# Patient Record
Sex: Female | Born: 1945 | Race: White | Hispanic: No | Smoking: Former smoker
Health system: Southern US, Community
[De-identification: ages and names within clinical notes are randomized; demographics above are authoritative.]

## PROBLEM LIST (undated history)

## (undated) DIAGNOSIS — I10 Essential (primary) hypertension: Secondary | ICD-10-CM

## (undated) DIAGNOSIS — K219 Gastro-esophageal reflux disease without esophagitis: Secondary | ICD-10-CM

## (undated) DIAGNOSIS — R911 Solitary pulmonary nodule: Secondary | ICD-10-CM

## (undated) DIAGNOSIS — K2940 Chronic atrophic gastritis without bleeding: Secondary | ICD-10-CM

## (undated) DIAGNOSIS — M1990 Unspecified osteoarthritis, unspecified site: Secondary | ICD-10-CM

## (undated) DIAGNOSIS — K5730 Diverticulosis of large intestine without perforation or abscess without bleeding: Secondary | ICD-10-CM

## (undated) DIAGNOSIS — E538 Deficiency of other specified B group vitamins: Secondary | ICD-10-CM

## (undated) DIAGNOSIS — I4891 Unspecified atrial fibrillation: Secondary | ICD-10-CM

## (undated) DIAGNOSIS — J449 Chronic obstructive pulmonary disease, unspecified: Secondary | ICD-10-CM

## (undated) DIAGNOSIS — R918 Other nonspecific abnormal finding of lung field: Secondary | ICD-10-CM

## (undated) DIAGNOSIS — E876 Hypokalemia: Secondary | ICD-10-CM

## (undated) HISTORY — DX: Unspecified osteoarthritis, unspecified site: M19.90

## (undated) HISTORY — PX: CERVICAL SPINE SURGERY: SHX589

## (undated) HISTORY — DX: Unspecified atrial fibrillation: I48.91

## (undated) HISTORY — PX: CARPAL TUNNEL RELEASE: SHX101

## (undated) HISTORY — DX: Chronic atrophic gastritis without bleeding: K29.40

## (undated) HISTORY — DX: Essential (primary) hypertension: I10

## (undated) HISTORY — DX: Gastro-esophageal reflux disease without esophagitis: K21.9

## (undated) HISTORY — PX: TUBAL LIGATION: SHX77

## (undated) HISTORY — DX: Chronic obstructive pulmonary disease, unspecified: J44.9

## (undated) HISTORY — DX: Hypokalemia: E87.6

## (undated) HISTORY — DX: Deficiency of other specified B group vitamins: E53.8

## (undated) HISTORY — DX: Diverticulosis of large intestine without perforation or abscess without bleeding: K57.30

---

## 1998-01-07 ENCOUNTER — Other Ambulatory Visit: Admission: RE | Admit: 1998-01-07 | Discharge: 1998-01-07 | Payer: Self-pay | Admitting: Family Medicine

## 1998-11-23 ENCOUNTER — Encounter: Admission: RE | Admit: 1998-11-23 | Discharge: 1998-12-07 | Payer: Self-pay | Admitting: Family Medicine

## 1999-02-06 ENCOUNTER — Other Ambulatory Visit: Admission: RE | Admit: 1999-02-06 | Discharge: 1999-02-06 | Payer: Self-pay | Admitting: Family Medicine

## 2000-02-14 ENCOUNTER — Other Ambulatory Visit: Admission: RE | Admit: 2000-02-14 | Discharge: 2000-02-14 | Payer: Self-pay | Admitting: Family Medicine

## 2000-05-24 ENCOUNTER — Encounter: Payer: Self-pay | Admitting: Orthopedic Surgery

## 2000-05-24 ENCOUNTER — Encounter: Admission: RE | Admit: 2000-05-24 | Discharge: 2000-05-24 | Payer: Self-pay | Admitting: Orthopedic Surgery

## 2000-06-25 ENCOUNTER — Encounter: Payer: Self-pay | Admitting: Neurosurgery

## 2000-06-28 ENCOUNTER — Encounter: Payer: Self-pay | Admitting: Neurosurgery

## 2000-06-28 ENCOUNTER — Ambulatory Visit (HOSPITAL_COMMUNITY): Admission: RE | Admit: 2000-06-28 | Discharge: 2000-06-29 | Payer: Self-pay | Admitting: Neurosurgery

## 2000-08-20 ENCOUNTER — Ambulatory Visit (HOSPITAL_COMMUNITY): Admission: RE | Admit: 2000-08-20 | Discharge: 2000-08-20 | Payer: Self-pay | Admitting: Neurosurgery

## 2000-08-20 ENCOUNTER — Encounter: Payer: Self-pay | Admitting: Neurosurgery

## 2001-03-13 ENCOUNTER — Other Ambulatory Visit: Admission: RE | Admit: 2001-03-13 | Discharge: 2001-03-13 | Payer: Self-pay | Admitting: Family Medicine

## 2001-04-16 ENCOUNTER — Encounter: Admission: RE | Admit: 2001-04-16 | Discharge: 2001-04-16 | Payer: Self-pay | Admitting: Family Medicine

## 2001-04-16 ENCOUNTER — Encounter: Payer: Self-pay | Admitting: Family Medicine

## 2001-07-28 DIAGNOSIS — K2940 Chronic atrophic gastritis without bleeding: Secondary | ICD-10-CM | POA: Insufficient documentation

## 2001-07-28 DIAGNOSIS — K449 Diaphragmatic hernia without obstruction or gangrene: Secondary | ICD-10-CM | POA: Insufficient documentation

## 2001-07-28 DIAGNOSIS — K648 Other hemorrhoids: Secondary | ICD-10-CM | POA: Insufficient documentation

## 2001-07-29 ENCOUNTER — Ambulatory Visit (HOSPITAL_COMMUNITY): Admission: RE | Admit: 2001-07-29 | Discharge: 2001-07-29 | Payer: Self-pay | Admitting: Gastroenterology

## 2001-07-29 ENCOUNTER — Encounter: Payer: Self-pay | Admitting: Gastroenterology

## 2002-04-08 ENCOUNTER — Other Ambulatory Visit: Admission: RE | Admit: 2002-04-08 | Discharge: 2002-04-08 | Payer: Self-pay | Admitting: Family Medicine

## 2003-04-09 ENCOUNTER — Other Ambulatory Visit: Admission: RE | Admit: 2003-04-09 | Discharge: 2003-04-09 | Payer: Self-pay | Admitting: Family Medicine

## 2004-06-16 ENCOUNTER — Ambulatory Visit (HOSPITAL_COMMUNITY): Admission: RE | Admit: 2004-06-16 | Discharge: 2004-06-16 | Payer: Self-pay | Admitting: Family Medicine

## 2004-08-17 ENCOUNTER — Other Ambulatory Visit: Admission: RE | Admit: 2004-08-17 | Discharge: 2004-08-17 | Payer: Self-pay | Admitting: Family Medicine

## 2005-12-13 ENCOUNTER — Other Ambulatory Visit: Admission: RE | Admit: 2005-12-13 | Discharge: 2005-12-13 | Payer: Self-pay | Admitting: Family Medicine

## 2006-08-23 ENCOUNTER — Ambulatory Visit: Payer: Self-pay | Admitting: Gastroenterology

## 2006-08-23 LAB — CONVERTED CEMR LAB
Albumin: 3.8 g/dL (ref 3.5–5.2)
Amylase: 82 units/L (ref 27–131)
Basophils Relative: 0.4 % (ref 0.0–1.0)
Bilirubin, Direct: 0.1 mg/dL (ref 0.0–0.3)
CO2: 31 meq/L (ref 19–32)
Calcium: 9.8 mg/dL (ref 8.4–10.5)
Chloride: 103 meq/L (ref 96–112)
GFR calc non Af Amer: 68 mL/min
Glucose, Bld: 90 mg/dL (ref 70–99)
HCT: 44.2 % (ref 36.0–46.0)
Hemoglobin: 15.1 g/dL — ABNORMAL HIGH (ref 12.0–15.0)
Monocytes Absolute: 0.6 10*3/uL (ref 0.2–0.7)
Neutrophils Relative %: 57.4 % (ref 43.0–77.0)
RDW: 12.8 % (ref 11.5–14.6)
Sed Rate: 11 mm/hr (ref 0–25)
Tissue Transglutaminase Ab, IgA: <1 units (ref <7)
Total Bilirubin: 0.5 mg/dL (ref 0.3–1.2)
Vitamin B-12: 473 pg/mL (ref 211–911)
WBC: 7.1 10*3/uL (ref 4.5–10.5)

## 2006-08-28 ENCOUNTER — Ambulatory Visit: Payer: Self-pay | Admitting: Cardiovascular Disease

## 2006-08-30 ENCOUNTER — Ambulatory Visit: Payer: Self-pay | Admitting: Gastroenterology

## 2006-09-11 ENCOUNTER — Encounter: Payer: Self-pay | Admitting: Gastroenterology

## 2006-09-11 ENCOUNTER — Ambulatory Visit: Payer: Self-pay | Admitting: Gastroenterology

## 2006-09-11 DIAGNOSIS — K5730 Diverticulosis of large intestine without perforation or abscess without bleeding: Secondary | ICD-10-CM | POA: Insufficient documentation

## 2006-10-04 ENCOUNTER — Ambulatory Visit (HOSPITAL_BASED_OUTPATIENT_CLINIC_OR_DEPARTMENT_OTHER): Admission: RE | Admit: 2006-10-04 | Discharge: 2006-10-04 | Payer: Self-pay | Admitting: Urology

## 2006-10-08 ENCOUNTER — Ambulatory Visit: Payer: Self-pay | Admitting: Gastroenterology

## 2006-11-29 ENCOUNTER — Encounter: Admission: RE | Admit: 2006-11-29 | Discharge: 2006-11-29 | Payer: Self-pay | Admitting: Orthopedic Surgery

## 2007-07-02 DIAGNOSIS — D510 Vitamin B12 deficiency anemia due to intrinsic factor deficiency: Secondary | ICD-10-CM

## 2008-01-01 ENCOUNTER — Ambulatory Visit (HOSPITAL_COMMUNITY): Admission: RE | Admit: 2008-01-01 | Discharge: 2008-01-01 | Payer: Self-pay | Admitting: Ophthalmology

## 2008-03-19 HISTORY — PX: OTHER SURGICAL HISTORY: SHX169

## 2010-08-01 NOTE — Assessment & Plan Note (Signed)
[facility]                         GASTROENTEROLOGY OFFICE NOTE   [name], [name]                       MRN:          [id number]  DATE:08/30/2006                            DOB:          1945/10/02    OFFICE NOTE   Shawna [name] had an abnormal CT scan and is referred to Dr. Darvin Neighbours for a  urologic evaluation.  She continues with a constant dull aching  sensation to the right lower quadrant going into her right flank with  associated abdominal gas, bloating, and alternating diarrhea and  constipation.  All lab data was unremarkable, as was abdominal exam.   VITAL SIGNS:  All stable.  However, she continues to have a rather  progressive weight loss of concern.   RECOMMENDATIONS:  1. Urologic referral.  2. Outpatient endoscopy and colonoscopy with colon and small bowel      biopsies.  3. Trial of Xifaxan 400 mg t.i.d. for 10 days along with daily Align.  4. Continue other medications listed and reviewed in her chart.     Vania Rea. Jarold Motto, MD, Caleen Essex, FAGA  Electronically Signed    DRP/MedQ  DD: 08/30/2006  DT: 08/30/2006  Job #: [id number]

## 2010-08-01 NOTE — Op Note (Signed)
[name], [name]              ACCOUNT NO.:  [id number]   MEDICAL RECORD NO.:  [id number]          PATIENT TYPE:  AMB   LOCATION:  [facility]                         FACILITY:  [facility]   PHYSICIAN:  Ronald L. [name], M.D.  DATE OF BIRTH:  08-12-1945   DATE OF PROCEDURE:  10/04/2006  DATE OF DISCHARGE:                               OPERATIVE REPORT   PREOPERATIVE DIAGNOSIS:  Left ureteral filling defect.   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   PROCEDURE PERFORMED:  Cystoscopy, bilateral retrograde pyelography.   SURGEON:  Gaynelle Arabian, MD.   ASSISTANT:  Tarri Glenn, MD.   ANESTHESIA:  General.   SPECIMENS:  None.   INDICATION FOR PROCEDURE:  [name] is a 65 year old female with a  history of abdominal pain.  She underwent a shield cut CT scan.  Axial  imaging with coronal reconstructions indicated a dilated distal left  ureter with concern for intraureteral filling defect.  Because of this,  the patient was brought to the operating room for the above-mentioned  procedures.   DESCRIPTION OF PROCEDURE IN DETAIL:  The patient was brought to the  operating room.  She was identified by her arm band.  Informed consent  was verified, and a preoperative timeout was performed.  After the  successful induction of general anesthesia, the patient was moved to the  dorsal lithotomy position.  All appropriate pressure points were padded  to avoid neurapraxic compartment syndrome.  Sequential compression  devices were employed.  Preoperative antibiotics were administered.  The  perineum was prepped and draped.  The surgeon was gowned and gloved.  A  #22-French cystoscopic sheath was used to introduce the 12-degree  cystoscopic lens transurethrally into the bladder.  Pancystourethroscopy  was performed with 12 and 70-degree lenses.  Bilateral ureteral orifices  were noted to be in normal anatomic position along the trigone.  Both  were  seen to efflux clear urine.  The remainder of the bladder was  inspected and was free of any mucosal lesions, erythema, foreign bodies,  diverticula, stones.  Attention was turned to the right ureteral  orifice.  It was cannulated with a cone-tipped catheter.  Contrast was  injected under fluoroscopy, and a right retrograde pyelogram was  performed.   Right retrograde pyelogram revealed a ureter that was normal in course  and caliber.  Contrast opacified all the calices of the right collecting  system, and they were free of any filling defects or hydronephrosis.   Attention was turned to the left ureteral orifice.  It was similarly  injected with contrast under fluoroscopy, and a left retrograde  pyelogram was performed.   Left retrograde pyelogram revealed a duplication with a common sheath of  the left collecting system.  The presumptive upper pole moiety was  aborted into a dilated blind ending ureter that terminated at the level  of the pelvic brim.  The lower pole moiety filled nicely and had a  somewhat drooping-lily appearance to it with only two major infundibula.  There were no filling defects.  The collecting system both of  the true  ureter and the aborted duplicate were smooth-walled and had no filling  defects.   We inserted a sensor wire and manipulated it to the level of the kidney.  We then used an angled Glidewire to cannulate the ureter a second time  and this time manipulated that wire into the aborted duplicate dilated  segment.  We then cannulated that segment with a #6-French end-hole  catheter and removed the Glidewire.  We then injected contrast to  confirm that indeed this was a smooth-walled area.  It did freely  communicate with the normal ureter as, under gentle pressure, contrast  was seen to reflux up into the kidney.  Having diagnosed the anatomic  variant and having seen no indication for a biopsy, we thus terminated  the procedure.  The patient  tolerated the procedure well, and there were  no complications.  Shawna [name] was the attending primary responsible  physician and was present and participated in all aspects of the  procedure.   DISPOSITION:  The patient was awoken from general anesthetic and  transported safely to the PACU.      Terie Purser, MD      Lucrezia Starch. [name], M.D.  Electronically Signed    JH/MEDQ  D:  10/04/2006  T:  10/04/2006  Job:  [id number]

## 2010-08-01 NOTE — Assessment & Plan Note (Signed)
[facility]                         GASTROENTEROLOGY OFFICE NOTE   Shawna [name], Shawna [name]                       MRN:          [id number]  DATE:08/23/2006                            DOB:          June 22, 1945    Shawna [name] is self-referred today for evaluation of a right-sided  abdominal pain that has been present for the last 3-4 weeks.   HISTORY OF PRESENT ILLNESS:  I have seen Shawna [name] for many years  because of Shawna [name] current abdominal pain which has defied diagnosis with  multiple procedures.  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  She is felt to have irritable bowel syndrome and has had  chronic, exertional right upper quadrant, right costal margin pain of  unexplained etiology.  I have not seen Shawna [name] in the last five years, and  she now presents with recurrence of Shawna [name] right upper quadrant/right flank  pain radiating into Shawna [name] back, which she says is made worse by having  bowel movements and is occasionally associated with episodes of nausea  and vomiting.  She has a lot of abdominal gas and bloating, anorexia.  Alledges she has lost 40 pounds of weight over the last several years.  She has alternating diarrhea and constipation, mostly constipation, but  denies melena or hematochezia.  She denies reflux symptoms and has no  dysphagia.  She has never had abdominal surgery, and previous exams of  Shawna [name] abdomen and gallbladder have been unremarkable.  Last CT scan was  five years ago.  Previous gastric biopsies have shown no evidence of  Helicobacter pylori infection.  She has had colonoscopy and small bowel  biopsies which have been normal.  Empiric biopsies of Shawna [name] colon have  also been unremarkable.  Because of an elevated gastrin level, she had a  Secretan stimulation test in 1992, which was not consistent with Z-E  syndrome but more consistent with atrophic gastritis.   The last endoscopic exam in our office was a colonoscopy in 2003  and an  endoscopic exam at that time.   The patient's current pain seems to be constant in nature now and has  really no precipitating or alleviating elements.  The striking component  to Shawna [name] pain is anorexia and weight loss.  She has no real steatorrhea-  type symptoms.  She denies fever, chills, skin rashes, joint pain, oral  stomatitis, or systemic complaints.  She has no peripheral vascular  disease, although she does smoke.   Shawna [name] past medical history is otherwise fairly noncontributory except for  hypertension.  She has had a previous tubal ligation, carpal tunnel  surgery, and cervical disk surgery.   MEDICATIONS:  1. Zesteretic daily.  2. Potassium 20 mEq a day.  3. B12 injections monthly.   FAMILY HISTORY:  Remarkable for diabetes and atherogenesis in Shawna [name]  mother.   SOCIAL HISTORY:  She is married and lives with Shawna [name] husband.  Has a  manual labor job.  She has an 11th grade education.  Smokes one pack of  cigarettes per day.  Denies ethanol abuse or dependency.   REVIEW OF SYSTEMS:  Otherwise noncontributory except for some chronic  low back pain.  She denies cardiovascular, pulmonary, genitourinary,  neurologic, orthopedic, or endocrine problems at this time.  She also  denies severe anxiety and depression.   PHYSICAL EXAMINATION:  GENERAL:  She is a healthy-appearing white female  in no distress.  VITAL SIGNS:  She is 5 feet 6 inches tall and weighs 121 pounds.  Blood  pressure 120/80, pulse 76 and regular.  SKIN:  I could not appreciate stigmata of chronic liver disease.  NECK:  There was no thyromegaly or lymphadenopathy noted.  CHEST:  Entirely clear.  HEART:  Appeared to be in a regular rate and rhythm without murmurs,  rubs or gallops.  ABDOMEN:  Soft, flat, nontender.  Without masses.  Bowel sounds were  normal.  EXTREMITIES:  Shawna [name] upper extremities were unremarkable.  RECTUM:  Inspection of the rectum was unremarkable, as was rectal exam.  Stool was guaiac  negative.  MENTAL STATUS:  Clear.   ASSESSMENT:  Shawna [name] has chronic abdominal complaints without any  diagnosis ever really being determined.  What is of concern is that she  has lost approximately 45 pounds of weight over the last 10 years.  She  does have a history of known atrophic gastritis and may have associated  bacterial overgrowth syndrome; however, because of the recurrent severe  nature of Shawna [name] pain, I have decided to go ahead and do imaging studies  before further evaluation.   RECOMMENDATIONS:  1. CT scan of the abdomen and pelvis.  2. Check blood work and screening stool specimens.  3. GI followup in one week's time and consider repeat endoscopic exam,      depending on CT results.  4. 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   ADDENDUM:  I have given Shawna [name] an excuse from work at Shawna [name] request until Shawna [name]  workup is complete.  I have given Shawna [name], in the interim, some Darvocet-N  100 to use for pain along with hyoscyamine 0.125 mg every 6-8 hours.     Vania Rea. Jarold Motto, MD, Caleen Essex, FAGA  Electronically Signed    DRP/MedQ  DD: 08/23/2006  DT: 08/24/2006  Job #: [id number]   cc:   Ernestina Penna, M.D.

## 2010-08-01 NOTE — Letter (Signed)
August 30, 2006    Windy Fast L. [name], M.D.  [address]  [place], [place] [address]   RE:  KARLETTA, MILLAY  MRN:  [id number]  /  DOB:  09/11/1945   Dear Ferne Reus:   Mrs. Liggins had a CT scan because of abdominal pain on August 28, 2006.  This was interpreted by Dr. Kennith Center showing a goblet sign in the  left ureter.  I am referring her at his request to exclude uro-  epithelial neoplasm.  She really denies genitourinary problems.    Sincerely,      Vania Rea. Jarold Motto, MD, Caleen Essex, FAGA  Electronically Signed    DRP/MedQ  DD: 08/30/2006  DT: 08/30/2006  Job #: [id number]

## 2010-08-04 NOTE — Op Note (Signed)
[facility]  Patient:    Shawna [name], Shawna [name]                       MRN: [id number] Proc. Date: 06/28/00 Adm. 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  PROCEDURE:  C5-6 anterior cervical diskectomy and fusion with allograft and anterior plate instrumentation.  SURGEON:  Julio Sicks, M.D.  ASSISTANT:  Donalee Citrin, Montez Hageman., M.D.  ANESTHESIA:  General endotracheal.  INDICATIONS:  [name] is a 65 year old female with history of severe neck and left upper extremity pain, paresthesias, and weakness consistent with a left-sided C6 radiculopathy, as well as evidence of early cervical myelopathy. MRI scanning demonstrates marked stenosis at the C5-6 level with bilateral neural foraminal narrowing and compression of the left-sided C6 nerve root by what appears to be an acute soft disk herniation within the foramen.  The patient has been counseled as to her options.  She has decided to proceed with a C5-6 anterior cervical diskectomy and fusion with allograft, anterior plating, for hopeful relief of her symptoms.  DESCRIPTION OF PROCEDURE:  Patient taken to the operating room, placed on the operating table in the supine position.  After an adequate level of anesthesia was achieved, the patient was positioned supine with her neck slightly extended and held in place with Holter traction.  The patients anterior cervical region is shaved and prepped sterilely.  A 10 blade is used to make a linear incision overlying the C5-6 interspace.  This was carried down sharply to the platysma.  The platysma was then divided vertically, and dissection proceeded along the medial border of the sternocleidomastoid muscle and carotid sheath on the right.  The  trachea and esophagus were mobilized and retracted toward the left.  The prevertebral fascia was stripped off the anterior spinal column.  The longus colli muscle was then elevated bilaterally using electrocautery.  Deep self-retaining retractor was placed. Intraoperative fluoroscopy was used, and the C5-6 level was confirmed.  Disk space was then incised with a 15 blade in rectangular fashion.  A wide disk space cleanout was then achieved using pituitary rongeurs, forward and backward-angled Karlin curettes, Kerrison rongeurs, and the high-speed drill. All disk was removed down to the level of the posterior annulus.  Microscope was brought into the field and used for the remainder of the diskectomy.  The remaining aspects of annulus and osteophytes were removed using the high-speed drill down to the level of the posterior longitudinal ligament.  The posterior longitudinal ligament was then elevated and resected in piecemeal fashion using Kerrison rongeurs.  The underlying thecal sac was identified.  A wide central decompression was then achieved using the Kerrison rongeurs. Decompression then proceeded out into the C6 foramina bilaterally.  The C6 nerve roots were identified bilaterally.  They were followed out distally along the course of the nerve root, and then the nerve roots themselves were widely decompressed.  Off to the left side there was a moderate amount of free disk herniation that was encountered and completely resected.  At this point, a probe passed easily both superiorly and inferiorly and down each foramen. The wound was then copiously irrigated, and Gelfoam was placed topically for hemostasis,  found to be good.  The disk space was then distracted and a 6 mm fibular wedge allograft was then packed into place and recessed approximately 1 mm from the anterior cortical surface.  The microscope and retractor system were removed.  Hemostasis was achieved with bipolar  electrocautery.  The wound was then irrigated with antibiotic solution one final time.  Final images using the intraoperative fluoroscopy revealed good position of bone graft and hardware, proper operative level, with normal alignment of the spine.  The wound was then closed in typical fashion.  Steri-Strips and a sterile dressing were applied.  There were no apparent complications.  The patient tolerated the procedure well and returns to the recovery room postoperatively.DD: 06/28/00 TD:  06/29/00 Job: [id number]

## 2010-12-19 LAB — HEMOGLOBIN AND HEMATOCRIT, BLOOD
HCT: 45
Hemoglobin: 15.5 — ABNORMAL HIGH

## 2010-12-19 LAB — BASIC METABOLIC PANEL
BUN: 13
CO2: 33 — ABNORMAL HIGH
Calcium: 9.9
GFR calc non Af Amer: 57 — ABNORMAL LOW
Glucose, Bld: 97
Potassium: 3.9

## 2011-01-01 LAB — URINALYSIS, ROUTINE W REFLEX MICROSCOPIC
Bilirubin Urine: NEGATIVE
Ketones, ur: NEGATIVE
Nitrite: NEGATIVE
Specific Gravity, Urine: 1.021
Urobilinogen, UA: 0.2

## 2011-01-01 LAB — COMPREHENSIVE METABOLIC PANEL
Alkaline Phosphatase: 80
BUN: 12
CO2: 28
GFR calc Af Amer: >60
GFR calc non Af Amer: >60
Glucose, Bld: 70
Sodium: 139
Total Bilirubin: 0.4
Total Protein: 6.4

## 2011-01-01 LAB — CBC
HCT: 39.4
Hemoglobin: 13.5
Platelets: 158
RDW: 14.5 — ABNORMAL HIGH
WBC: 5.8

## 2011-01-01 LAB — PROTIME-INR
INR: 1
Prothrombin Time: 13.7

## 2011-05-14 DIAGNOSIS — R911 Solitary pulmonary nodule: Secondary | ICD-10-CM

## 2011-05-14 DIAGNOSIS — M1990 Unspecified osteoarthritis, unspecified site: Secondary | ICD-10-CM

## 2011-05-14 DIAGNOSIS — I10 Essential (primary) hypertension: Secondary | ICD-10-CM

## 2011-05-14 DIAGNOSIS — J449 Chronic obstructive pulmonary disease, unspecified: Secondary | ICD-10-CM

## 2011-05-14 DIAGNOSIS — E876 Hypokalemia: Secondary | ICD-10-CM

## 2011-05-14 DIAGNOSIS — E538 Deficiency of other specified B group vitamins: Secondary | ICD-10-CM | POA: Insufficient documentation

## 2011-05-15 ENCOUNTER — Encounter: Payer: Self-pay | Admitting: Thoracic Surgery (Cardiothoracic Vascular Surgery)

## 2011-05-15 ENCOUNTER — Institutional Professional Consult (permissible substitution) (INDEPENDENT_AMBULATORY_CARE_PROVIDER_SITE_OTHER): Payer: Medicare Other | Admitting: Thoracic Surgery (Cardiothoracic Vascular Surgery)

## 2011-05-15 ENCOUNTER — Other Ambulatory Visit: Payer: Self-pay | Admitting: Thoracic Surgery (Cardiothoracic Vascular Surgery)

## 2011-05-15 VITALS — BP 160/97 | HR 92 | Resp 18 | Ht 66.0 in | Wt 135.0 lb

## 2011-05-15 DIAGNOSIS — D381 Neoplasm of uncertain behavior of trachea, bronchus and lung: Secondary | ICD-10-CM

## 2011-05-15 DIAGNOSIS — R911 Solitary pulmonary nodule: Secondary | ICD-10-CM

## 2011-05-15 NOTE — Progress Notes (Signed)
PCP is BUTLER,CYNTHIA, DO, DO Referring Provider is Samuel Jester, DO  Chief Complaint  Patient presents with  . Lung Lesion    Referral from Dr Charm Barges for eval on RUL nodule, Chest CT 05/10/2011    HPI: Shawna [name] is a 66 year old woman who presents with chief complaint of a lung nodule.  Shawna. [name] is a 66 year old woman with a history of tobacco abuse and COPD, she complains of a cough, chest tightness, and shortness of breath dating back about a month. She's had bronchitis before these symptoms were similar so she didn't immediately seek medical attention. She says it started with nasal and sinus congestion and then moved down to her chest. She does note that her chest tightness and shortness of breath with exertion have been worse recently particularly when she walks up stairs. She saw [name] who recommended a chest x-ray, she initially did not want to do the x-ray but a week later when her symptoms were persistent she went ahead and had the x-ray done and it showed a new right upper lobe shadow. A CT of the chest was done which showed a spiculated 1.4 x 1.2 x 1.0 cm right upper lobe mass. There is no hilar or mediastinal adenopathy. There was evidence of emphysema. There also was noted significant calcification in the aorta and coronary arteries.   Past Medical History  Diagnosis Date  . COPD (chronic obstructive pulmonary disease)   . HTN (hypertension)   . B12 deficiency   . OA (osteoarthritis)   . Hypokalemia     No past surgical history on file.  Family history Significant for her mother having cardiovascular disease   Social History History  Substance Use Topics  . Smoking status: Current Everyday Smoker -- 20.0 packs/day    Types: Cigarettes  . Smokeless tobacco: Not on file  . Alcohol Use: Not on file    Current Outpatient Prescriptions  Medication Sig Dispense Refill  . albuterol (PROVENTIL HFA;VENTOLIN HFA) 108 (90 BASE) MCG/ACT inhaler Inhale 2 puffs  into the lungs every 6 (six) hours as needed.      . Fluticasone-Salmeterol (ADVAIR) 500-50 MCG/DOSE AEPB Inhale 1 puff into the lungs every 12 (twelve) hours.      Marland Kitchen ipratropium-albuterol (DUONEB) 0.5-2.5 (3) MG/3ML SOLN Take 3 mLs by nebulization every 4 (four) hours as needed.      Marland Kitchen KLOR-CON M20 20 MEQ tablet Take 20 mEq by mouth daily.       Marland Kitchen lisinopril-hydrochlorothiazide (PRINZIDE,ZESTORETIC) 10-12.5 MG per tablet Take 1 tablet by mouth daily.      . meloxicam (MOBIC) 15 MG tablet Take 15 mg by mouth daily.        No Known Allergies  Review of Systems  Constitutional: Positive for activity change. Negative for fever, chills, appetite change and unexpected weight change.  HENT: Positive for congestion.   Eyes: Negative.   Respiratory: Positive for cough (nonproductive), chest tightness, shortness of breath (with exertion) and wheezing. Negative for stridor.   Cardiovascular: Positive for chest pain. Negative for leg swelling.  Genitourinary: Positive for vaginal discharge.  Musculoskeletal: Positive for back pain, joint swelling and arthralgias.  Neurological: Negative.   Hematological: Negative.   All other systems reviewed and are negative.    BP 160/97  Pulse 92  Resp 18  Ht 5\' 6"  (1.676 m)  Wt 135 lb (61.236 kg)  BMI 21.79 kg/m2  SpO2 98% Physical Exam  Constitutional: She is oriented to person, place, and time. She appears well-developed  and well-nourished. No distress.  HENT:  Head: Normocephalic and atraumatic.  Eyes: EOM are normal. Pupils are equal, round, and reactive to light.  Neck: Neck supple. No JVD present. No tracheal deviation present. No thyromegaly present.       Well healed scar  Cardiovascular: Normal rate, regular rhythm, normal heart sounds and intact distal pulses.  Exam reveals no gallop and no friction rub.   No murmur heard. Pulmonary/Chest: Effort normal and breath sounds normal. She has no wheezes. She has no rales.  Abdominal: Soft. There  is no tenderness.  Musculoskeletal: Normal range of motion. She exhibits no edema.  Lymphadenopathy:    She has no cervical adenopathy.  Neurological: She is alert and oriented to person, place, and time.       No focal motor deficits  Skin: Skin is warm and dry.  Psychiatric: She has a normal mood and affect.     Diagnostic Tests: Chest x-ray and CT of the chest were reviewed. Findings as previously noted  Impression: 66 year old smoker with a newly discovered right upper lobe nodule which is highly suspicious for bronchogenic carcinoma. This has to be considered a lung cancer to be proven otherwise. Unfortunately the lesion is very central in the right upper lobe and is not amenable to wedge resection. Therefore I think we should proceed with a PET/CT. At the PET CT is positive I would recommend proceeding with a right upper lobectomy and, if the lesion were positive, mediastinal lymph node dissection. If the PET CT was negative, then I would recommend ENB. We will order the PET CT to be done as soon as possible  In the meantime, there are a couple of other issues related to her fitness for surgery. 1. She needs pulmonary function testing, we will go ahead and schedule that 2. She needs preoperative cardiology evaluation-she is at high risk to have coronary disease given her smoking, family history, and hypertension. She has symptoms that are as likely, or more likely, to be angina as to be due to COPD and bronchitis. And on top of that she has evidence of some calcified plaque in her coronary arteries on CT scan.  Plan: 1. PET/CT 2. PFT 3. Cardiology consultation in [place] 4. I will see her back once the above evaluations have been done to discuss our next step

## 2011-05-22 ENCOUNTER — Encounter (HOSPITAL_COMMUNITY)
Admission: RE | Admit: 2011-05-22 | Discharge: 2011-05-22 | Disposition: A | Discharge status: Home / Self Care | Payer: Medicare Other | Source: Ambulatory Visit | Attending: Thoracic Surgery (Cardiothoracic Vascular Surgery) | Admitting: Thoracic Surgery (Cardiothoracic Vascular Surgery)

## 2011-05-22 ENCOUNTER — Encounter (HOSPITAL_COMMUNITY): Payer: Self-pay

## 2011-05-22 ENCOUNTER — Ambulatory Visit (HOSPITAL_COMMUNITY)
Admission: RE | Admit: 2011-05-22 | Discharge: 2011-05-22 | Disposition: A | Discharge status: Home / Self Care | Payer: Medicare Other | Source: Ambulatory Visit | Attending: Thoracic Surgery (Cardiothoracic Vascular Surgery) | Admitting: Thoracic Surgery (Cardiothoracic Vascular Surgery)

## 2011-05-22 DIAGNOSIS — R062 Wheezing: Secondary | ICD-10-CM | POA: Insufficient documentation

## 2011-05-22 DIAGNOSIS — J988 Other specified respiratory disorders: Secondary | ICD-10-CM | POA: Insufficient documentation

## 2011-05-22 DIAGNOSIS — R222 Localized swelling, mass and lump, trunk: Secondary | ICD-10-CM | POA: Insufficient documentation

## 2011-05-22 DIAGNOSIS — R911 Solitary pulmonary nodule: Secondary | ICD-10-CM | POA: Insufficient documentation

## 2011-05-22 DIAGNOSIS — R0989 Other specified symptoms and signs involving the circulatory and respiratory systems: Secondary | ICD-10-CM | POA: Insufficient documentation

## 2011-05-22 DIAGNOSIS — R059 Cough, unspecified: Secondary | ICD-10-CM | POA: Insufficient documentation

## 2011-05-22 DIAGNOSIS — R0609 Other forms of dyspnea: Secondary | ICD-10-CM | POA: Insufficient documentation

## 2011-05-22 DIAGNOSIS — Z79899 Other long term (current) drug therapy: Secondary | ICD-10-CM | POA: Insufficient documentation

## 2011-05-22 DIAGNOSIS — D381 Neoplasm of uncertain behavior of trachea, bronchus and lung: Secondary | ICD-10-CM

## 2011-05-22 DIAGNOSIS — R05 Cough: Secondary | ICD-10-CM | POA: Insufficient documentation

## 2011-05-22 HISTORY — DX: Other nonspecific abnormal finding of lung field: R91.8

## 2011-05-22 LAB — PULMONARY FUNCTION TEST

## 2011-05-22 MED ORDER — ALBUTEROL SULFATE (5 MG/ML) 0.5% IN NEBU
2.5000 mg | INHALATION_SOLUTION | Freq: Once | RESPIRATORY_TRACT | Status: AC
  Administered 2011-05-22: 2.5 mg via RESPIRATORY_TRACT

## 2011-05-22 MED ORDER — FLUDEOXYGLUCOSE F - 18 (FDG) INJECTION
17.4000 | Freq: Once | INTRAVENOUS | Status: AC | PRN
  Administered 2011-05-22: 17.4 via INTRAVENOUS

## 2011-05-23 ENCOUNTER — Encounter: Payer: Self-pay | Admitting: *Deleted

## 2011-05-23 ENCOUNTER — Encounter: Payer: Self-pay | Admitting: Cardiology

## 2011-05-23 ENCOUNTER — Telehealth: Payer: Self-pay | Admitting: *Deleted

## 2011-05-23 ENCOUNTER — Ambulatory Visit (INDEPENDENT_AMBULATORY_CARE_PROVIDER_SITE_OTHER): Payer: Medicare Other | Admitting: Cardiology

## 2011-05-23 VITALS — BP 135/88 | HR 90 | Ht 66.0 in | Wt 130.0 lb

## 2011-05-23 DIAGNOSIS — I10 Essential (primary) hypertension: Secondary | ICD-10-CM

## 2011-05-23 DIAGNOSIS — Z01810 Encounter for preprocedural cardiovascular examination: Secondary | ICD-10-CM | POA: Insufficient documentation

## 2011-05-23 DIAGNOSIS — F17200 Nicotine dependence, unspecified, uncomplicated: Secondary | ICD-10-CM

## 2011-05-23 DIAGNOSIS — Z720 Tobacco use: Secondary | ICD-10-CM | POA: Insufficient documentation

## 2011-05-23 DIAGNOSIS — R9430 Abnormal result of cardiovascular function study, unspecified: Secondary | ICD-10-CM

## 2011-05-23 LAB — GLUCOSE, CAPILLARY: Glucose-Capillary: 93 mg/dL (ref 70–99)

## 2011-05-23 NOTE — Assessment & Plan Note (Signed)
We discussed a specific strategy for tobacco cessation.  (Greater than three minutes discussing tobacco cessation.)  She will try nicotine patches.

## 2011-05-23 NOTE — Assessment & Plan Note (Signed)
The patient, according to ACC/AHA guidelines, needs preoperative risk stratification given her known coronary disease, limited functional status and dyspnea. She would not be locked treadmill so she will have a [employer].  Further testing will be based on these results.

## 2011-05-23 NOTE — Progress Notes (Signed)
HPI The patient presents as a new patient for evaluation of coronary artery calcification. She's found to have a right upper lung nodule which is spiculated and consistent with a primary lung cancer. She is most likely going to need to have this resected he is being evaluated for that. As part of this she had a CT of her chest which demonstrated emphysema, the right upper lobe nodule and extensive aortic and coronary calcification. She has had no prior cardiac history. She recalls a stress test many years ago. She is limited somewhat by dyspnea but feels better since she is recovered from her recent bronchitis. She does get dyspneic with some activities but denies any PND or orthopnea. Her most exerting activity currently as vacuuming. With that she denies any chest pressure, neck or arm discomfort. She has no palpitations, presyncope or syncope.  No Known Allergies  Current Outpatient Prescriptions  Medication Sig Dispense Refill  . albuterol (PROVENTIL HFA;VENTOLIN HFA) 108 (90 BASE) MCG/ACT inhaler Inhale 2 puffs into the lungs every 6 (six) hours as needed.      . ALPRAZolam (XANAX) 0.25 MG tablet Take 0.25 mg by mouth at bedtime as needed.      . cyanocobalamin (,VITAMIN B-12,) 1000 MCG/ML injection Inject 1,000 mcg into the muscle every 30 (thirty) days.      . Fluticasone-Salmeterol (ADVAIR) 500-50 MCG/DOSE AEPB Inhale 1 puff into the lungs every 12 (twelve) hours.      Marland Kitchen ipratropium-albuterol (DUONEB) 0.5-2.5 (3) MG/3ML SOLN Take 3 mLs by nebulization every 4 (four) hours as needed.      Marland Kitchen KLOR-CON M20 20 MEQ tablet Take 20 mEq by mouth daily.       Marland Kitchen lisinopril-hydrochlorothiazide (PRINZIDE,ZESTORETIC) 20-12.5 MG per tablet Take 1 tablet by mouth daily.      . meloxicam (MOBIC) 15 MG tablet Take 15 mg by mouth daily.      . ranitidine (ZANTAC) 150 MG tablet Take 150 mg by mouth daily.        Past Medical History  Diagnosis Date  . COPD (chronic obstructive pulmonary disease)   .  HTN (hypertension)   . B12 deficiency   . OA (osteoarthritis)   . Hypokalemia   . Mass of lung     Past Surgical History  Procedure Date  . Carpal tunnel release     bilateral  . Tubal ligation     x 2  . Cervical spine surgery     Family History  Problem Relation Age of Onset  . Coronary artery disease Mother 51  . Alzheimer's disease Mother     History   Social History  . Marital Status: Divorced    Spouse Name: N/A    Number of Children: 3  . Years of Education: N/A   Occupational History  . RETIRED    Social History Main Topics  . Smoking status: Current Everyday Smoker -- 0.3 packs/day for 40 years    Types: Cigarettes  . Smokeless tobacco: Never Used   Comment: Down to a few cigarettes per day.  . Alcohol Use: Not on file  . Drug Use: Not on file  . Sexually Active: Not on file   Other Topics Concern  . Not on file   Social History Narrative   Lives alone.    ROS:  Or bruising, anemia, asthma, wheezing, heartburn. Otherwise as stated in the history of present illness and negative for all other systems.  PHYSICAL EXAM BP 135/88  Pulse 90  Ht 5\' 6"  (1.676 m)  Wt 130 lb (58.968 kg)  BMI 20.98 kg/m2  SpO2 96% GENERAL:  Well appearing HEENT:  Pupils equal round and reactive, fundi not visualized, oral mucosa unremarkable NECK:  No jugular venous distention, waveform within normal limits, carotid upstroke brisk and symmetric, no bruits, no thyromegaly LYMPHATICS:  No cervical, inguinal adenopathy LUNGS:  Clear to auscultation bilaterally BACK:  No CVA tenderness CHEST:  Unremarkable HEART:  PMI not displaced or sustained,S1 and S2 within normal limits, no S3, no S4, no clicks, no rubs, no murmurs ABD:  Flat, positive bowel sounds normal in frequency in pitch, no bruits, no rebound, no guarding, no midline pulsatile mass, no hepatomegaly, no splenomegaly EXT:  2 plus pulses throughout, no edema, no cyanosis no clubbing SKIN:  No rashes no  nodules NEURO:  Cranial nerves II through XII grossly intact, motor grossly intact throughout PSYCH:  Cognitively intact, oriented to person place and time  EKG:  Sinus rhythm, rate 96, axis within normal limits, intervals within normal limits, no acute ST-T wave changes.  ASSESSMENT AND PLAN

## 2011-05-23 NOTE — Assessment & Plan Note (Signed)
The blood pressure is at target. No change in medications is indicated. We will continue with therapeutic lifestyle changes (TLC).  

## 2011-05-23 NOTE — Telephone Encounter (Signed)
lexiscan myoview Scheduled for 05-30-2011 @ [facility] [employer]

## 2011-05-23 NOTE — Patient Instructions (Signed)
Your follow up will be based on your test results. Your physician recommends that you continue on your current medications as directed. Please refer to the Current Medication list given to you today. Your physician has requested that you have a lexiscan myoview. For further information please visit [email or web address]. Please follow instruction sheet, as given.  If the results of your test are normal or stable, you will receive a letter. If they are abnormal, the nurse will contact you by phone.  

## 2011-05-25 NOTE — Telephone Encounter (Signed)
Auth # [id number] exp 07/09/11

## 2011-05-29 ENCOUNTER — Encounter: Payer: Medicare Other | Admitting: Thoracic Surgery (Cardiothoracic Vascular Surgery)

## 2011-05-30 DIAGNOSIS — R079 Chest pain, unspecified: Secondary | ICD-10-CM

## 2011-05-31 ENCOUNTER — Encounter: Payer: Self-pay | Admitting: *Deleted

## 2011-05-31 ENCOUNTER — Telehealth: Payer: Self-pay | Admitting: *Deleted

## 2011-05-31 NOTE — Telephone Encounter (Signed)
Message copied by Arlyss Gandy on Thu May 31, 2011  3:40 PM ------      Message from: Rollene Rotunda      Created: Thu May 31, 2011 12:53 PM       Study negative for any evidence of ischemia or infarct.  Low risk study.  Therefore, based on ACC/AHA guidelines, the patient would be at acceptable risk for the planned procedure without further cardiovascular testing. Call Ms. Ken with the results and send results to BUTLER,CYNTHIA, DO.  Please fax this result to the surgeon for clearance. Thanks.

## 2011-05-31 NOTE — Telephone Encounter (Signed)
Pt notified of results and verbalized understanding.   Surgeon is Dr. Charlett Lango w/[facility]. Note will be routed to his inbasket. Also report faxed to Dr. Charm Barges.

## 2011-06-07 ENCOUNTER — Encounter: Payer: Medicare Other | Admitting: Thoracic Surgery (Cardiothoracic Vascular Surgery)

## 2011-06-12 ENCOUNTER — Encounter: Payer: Self-pay | Admitting: Thoracic Surgery (Cardiothoracic Vascular Surgery)

## 2011-06-12 ENCOUNTER — Ambulatory Visit (INDEPENDENT_AMBULATORY_CARE_PROVIDER_SITE_OTHER): Payer: Medicare Other | Admitting: Thoracic Surgery (Cardiothoracic Vascular Surgery)

## 2011-06-12 ENCOUNTER — Encounter: Payer: Medicare Other | Admitting: Thoracic Surgery (Cardiothoracic Vascular Surgery)

## 2011-06-12 VITALS — BP 166/90 | HR 76 | Resp 16 | Ht 66.0 in | Wt 135.0 lb

## 2011-06-12 DIAGNOSIS — D491 Neoplasm of unspecified behavior of respiratory system: Secondary | ICD-10-CM

## 2011-06-12 NOTE — Progress Notes (Signed)
Patient ID: Shawna [name], female   DOB: December 24, 1945, 66 y.o.   MRN: [id number] Mrs. Strider is a 66 year old woman with a history of tobacco abuse who was seen in the office in February regarding a new right upper lobe nodule. This was a 1.4 x 1.2 x 1.0 cm spiculated nodule located centrally in the right upper lobe.  I recommended that Ms. Harkless we proceed with evaluation for possible surgical resection with a PET/CT, PFT and cardiology evaluation.  She was seen by [name] and had a pharmacologic nuclear scan which was a low risk study.  PET/CT showed the right upper lobe nodule to be hypermetabolic with an SUV of 2.9, there is no evidence of regional or distant metastases  Pulmonary function testing showed moderate COPD and FEV1 of 1.68 (64% of predicted) which improved to 1.86 with bronchodilators. DLCO is 50%  I discussed the indications of these results with Mrs. Kintz. 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 She does understand there is a possibility this is not a lung cancer, but that is unlikely and can only be definitively proven with excisional biopsy. Bronchoscopic or percutaneous biopsy could have false-negative results and could not definitively rule out this as a cancer. Therefore I recommended to her that we proceed with a right VATS and right upper lobectomy with lymph node dissection for definitive diagnosis and treatment. This lesion is very centrally located and is not amenable to wedge resection prior to lobectomy.  I discussed with her the general nature of the procedure, incision to be used, need for general anesthesia, expected outcomes. She understands that even with surgical resection if there is a risk of recurrence lung cancer and she would need to continue to be followed. She understands the risk of surgery include but are not limited to death, stroke, MI, DVT, PE, bleeding,  possible need for transfusions, infections, prolonged air leak, as well as other unforeseeable complications. She is very anxious about the possibility of having surgery but did agree to proceed.  We will plan to proceed with surgery on Monday, April 15.

## 2011-06-28 ENCOUNTER — Encounter (HOSPITAL_COMMUNITY): Payer: Self-pay | Admitting: Pharmacy Technician

## 2011-06-29 ENCOUNTER — Other Ambulatory Visit: Payer: Self-pay

## 2011-06-29 DIAGNOSIS — R918 Other nonspecific abnormal finding of lung field: Secondary | ICD-10-CM

## 2011-07-04 ENCOUNTER — Encounter (HOSPITAL_COMMUNITY)
Admission: RE | Admit: 2011-07-04 | Discharge: 2011-07-04 | Disposition: A | Discharge status: Home / Self Care | Payer: Medicare Other | Source: Ambulatory Visit | Attending: Thoracic Surgery (Cardiothoracic Vascular Surgery) | Admitting: Thoracic Surgery (Cardiothoracic Vascular Surgery)

## 2011-07-04 ENCOUNTER — Encounter (HOSPITAL_COMMUNITY): Payer: Self-pay

## 2011-07-04 VITALS — BP 155/84 | HR 79 | Temp 97.8°F | Resp 20 | Ht 66.0 in | Wt 130.3 lb

## 2011-07-04 DIAGNOSIS — R918 Other nonspecific abnormal finding of lung field: Secondary | ICD-10-CM

## 2011-07-04 LAB — URINALYSIS, ROUTINE W REFLEX MICROSCOPIC
Glucose, UA: NEGATIVE mg/dL
Ketones, ur: NEGATIVE mg/dL
Leukocytes, UA: NEGATIVE
Nitrite: NEGATIVE
Protein, ur: NEGATIVE mg/dL

## 2011-07-04 LAB — APTT: aPTT: 28 seconds (ref 24–37)

## 2011-07-04 LAB — COMPREHENSIVE METABOLIC PANEL
ALT: 14 U/L (ref 0–35)
AST: 18 U/L (ref 0–37)
Calcium: 9.9 mg/dL (ref 8.4–10.5)
Sodium: 138 mEq/L (ref 135–145)
Total Protein: 6.8 g/dL (ref 6.0–8.3)

## 2011-07-04 LAB — CBC
MCH: 30 pg (ref 26.0–34.0)
MCHC: 34 g/dL (ref 30.0–36.0)
Platelets: 150 10*3/uL (ref 150–400)
RBC: 4.7 MIL/uL (ref 3.87–5.11)

## 2011-07-04 LAB — BLOOD GAS, ARTERIAL
Drawn by: 344381
FIO2: 0.21 %
O2 Saturation: 93.6 %
Patient temperature: 98.6
pH, Arterial: 7.448 — ABNORMAL HIGH (ref 7.350–7.400)
pO2, Arterial: 65.5 mmHg — ABNORMAL LOW (ref 80.0–100.0)

## 2011-07-04 NOTE — Pre-Procedure Instructions (Addendum)
[address]  07/04/2011   Your procedure is scheduled on:  July 06, 2011  Report to [facility] at 5:30 AM.  Call this number if you have problems the morning of surgery: [phone]   Remember:   Do not eat food:After Midnight.  May have clear liquids: up to 4 Hours before arrival.  Clear liquids include soda, tea, black coffee, apple or grape juice, broth.  Take these medicines the morning of surgery with A SIP OF WATER: INHALER, ZANTAC   Do not wear jewelry, make-up or nail polish.  Do not wear lotions, powders, or perfumes. You may wear deodorant.  Do not shave 48 hours prior to surgery.  Do not bring valuables to the hospital.  Contacts, dentures or bridgework may not be worn into surgery.  Leave suitcase in the car. After surgery it may be brought to your room.  For patients admitted to the hospital, checkout time is 11:00 AM the day of discharge.   Patients discharged the day of surgery will not be allowed to drive home.  Name and phone number of your driver: NA  Special Instructions: CHG Shower Use Special Wash: 1/2 bottle night before surgery and 1/2 bottle morning of surgery.   Please read over the following fact sheets that you were given: Pain Booklet, Blood Transfusion Information, MRSA Information and Surgical Site Infection Prevention

## 2011-07-05 MED ORDER — CEFUROXIME SODIUM 1.5 G IJ SOLR
1.5000 g | INTRAMUSCULAR | Status: AC
  Administered 2011-07-06: 1.5 g via INTRAVENOUS
  Filled 2011-07-05: qty 1.5

## 2011-07-05 NOTE — Consult Note (Signed)
Anesthesia Chart Review:  Patient is a 66 year old female scheduled for a right VATS, RU lobectomy on 07/06/11.  History included smoking, COPD, OA, HTN, B12 deficiency.  She saw [name] for a preoperative Cardiology evaluation.  A stress test was done at [facility] on 05/30/11 (scanned under Results Review tab) which showed normal LV perfusion, no ischemia, EF 60%, and felt low risk.  Preoperative labs, EKG, CXR noted.  Plan to proceed.

## 2011-07-06 ENCOUNTER — Ambulatory Visit (HOSPITAL_COMMUNITY): Payer: Medicare Other | Admitting: Vascular Surgery

## 2011-07-06 ENCOUNTER — Encounter (HOSPITAL_COMMUNITY): Payer: Self-pay | Admitting: Vascular Surgery

## 2011-07-06 ENCOUNTER — Inpatient Hospital Stay (HOSPITAL_COMMUNITY)
Admission: RE | Admit: 2011-07-06 | Discharge: 2011-07-15 | DRG: 163 | Disposition: A | Discharge status: Home Health | Payer: Medicare Other | Source: Ambulatory Visit | Attending: Thoracic Surgery (Cardiothoracic Vascular Surgery) | Admitting: Thoracic Surgery (Cardiothoracic Vascular Surgery)

## 2011-07-06 ENCOUNTER — Inpatient Hospital Stay (HOSPITAL_COMMUNITY): Payer: Medicare Other

## 2011-07-06 ENCOUNTER — Encounter (HOSPITAL_COMMUNITY): Payer: Self-pay | Admitting: *Deleted

## 2011-07-06 ENCOUNTER — Encounter (HOSPITAL_COMMUNITY)
Admission: RE | Disposition: A | Discharge status: Home Health | Payer: Self-pay | Source: Ambulatory Visit | Attending: Thoracic Surgery (Cardiothoracic Vascular Surgery)

## 2011-07-06 DIAGNOSIS — E876 Hypokalemia: Secondary | ICD-10-CM | POA: Diagnosis not present

## 2011-07-06 DIAGNOSIS — Z79899 Other long term (current) drug therapy: Secondary | ICD-10-CM

## 2011-07-06 DIAGNOSIS — Z01818 Encounter for other preprocedural examination: Secondary | ICD-10-CM

## 2011-07-06 DIAGNOSIS — J95811 Postprocedural pneumothorax: Secondary | ICD-10-CM | POA: Diagnosis not present

## 2011-07-06 DIAGNOSIS — J9382 Other air leak: Secondary | ICD-10-CM | POA: Diagnosis not present

## 2011-07-06 DIAGNOSIS — E538 Deficiency of other specified B group vitamins: Secondary | ICD-10-CM | POA: Diagnosis present

## 2011-07-06 DIAGNOSIS — K219 Gastro-esophageal reflux disease without esophagitis: Secondary | ICD-10-CM | POA: Diagnosis present

## 2011-07-06 DIAGNOSIS — Z01812 Encounter for preprocedural laboratory examination: Secondary | ICD-10-CM

## 2011-07-06 DIAGNOSIS — M1990 Unspecified osteoarthritis, unspecified site: Secondary | ICD-10-CM | POA: Diagnosis present

## 2011-07-06 DIAGNOSIS — D381 Neoplasm of uncertain behavior of trachea, bronchus and lung: Secondary | ICD-10-CM

## 2011-07-06 DIAGNOSIS — J438 Other emphysema: Secondary | ICD-10-CM | POA: Diagnosis present

## 2011-07-06 DIAGNOSIS — J690 Pneumonitis due to inhalation of food and vomit: Secondary | ICD-10-CM | POA: Diagnosis not present

## 2011-07-06 DIAGNOSIS — Y849 Medical procedure, unspecified as the cause of abnormal reaction of the patient, or of later complication, without mention of misadventure at the time of the procedure: Secondary | ICD-10-CM | POA: Diagnosis not present

## 2011-07-06 DIAGNOSIS — I700 Atherosclerosis of aorta: Secondary | ICD-10-CM | POA: Diagnosis present

## 2011-07-06 DIAGNOSIS — I2510 Atherosclerotic heart disease of native coronary artery without angina pectoris: Secondary | ICD-10-CM | POA: Diagnosis present

## 2011-07-06 DIAGNOSIS — F17200 Nicotine dependence, unspecified, uncomplicated: Secondary | ICD-10-CM | POA: Diagnosis present

## 2011-07-06 DIAGNOSIS — R918 Other nonspecific abnormal finding of lung field: Secondary | ICD-10-CM

## 2011-07-06 DIAGNOSIS — J8410 Pulmonary fibrosis, unspecified: Principal | ICD-10-CM | POA: Diagnosis present

## 2011-07-06 DIAGNOSIS — T8140XA Infection following a procedure, unspecified, initial encounter: Secondary | ICD-10-CM | POA: Diagnosis not present

## 2011-07-06 DIAGNOSIS — D72829 Elevated white blood cell count, unspecified: Secondary | ICD-10-CM | POA: Diagnosis not present

## 2011-07-06 DIAGNOSIS — Z01810 Encounter for preprocedural cardiovascular examination: Secondary | ICD-10-CM

## 2011-07-06 DIAGNOSIS — I4891 Unspecified atrial fibrillation: Secondary | ICD-10-CM | POA: Diagnosis not present

## 2011-07-06 DIAGNOSIS — I10 Essential (primary) hypertension: Secondary | ICD-10-CM | POA: Diagnosis present

## 2011-07-06 HISTORY — PX: LUNG SURGERY: SHX703

## 2011-07-06 SURGERY — VIDEO ASSISTED THORACOSCOPY (VATS)/ LOBECTOMY
Anesthesia: General | Site: Chest | Laterality: Right | Wound class: Clean Contaminated

## 2011-07-06 MED ORDER — ALBUTEROL SULFATE HFA 108 (90 BASE) MCG/ACT IN AERS
2.0000 | INHALATION_SPRAY | Freq: Four times a day (QID) | RESPIRATORY_TRACT | Status: DC | PRN
  Administered 2011-07-11: 2 via RESPIRATORY_TRACT
  Filled 2011-07-06 (×2): qty 6.7

## 2011-07-06 MED ORDER — TRAMADOL HCL 50 MG PO TABS
50.0000 mg | ORAL_TABLET | Freq: Four times a day (QID) | ORAL | Status: DC | PRN
  Administered 2011-07-07 – 2011-07-13 (×7): 100 mg via ORAL
  Administered 2011-07-14: 50 mg via ORAL
  Administered 2011-07-14 – 2011-07-15 (×2): 100 mg via ORAL
  Filled 2011-07-06 (×4): qty 2
  Filled 2011-07-06: qty 1
  Filled 2011-07-06 (×5): qty 2

## 2011-07-06 MED ORDER — 0.9 % SODIUM CHLORIDE (POUR BTL) OPTIME
TOPICAL | Status: DC | PRN
  Administered 2011-07-06: 1000 mL

## 2011-07-06 MED ORDER — FENTANYL 10 MCG/ML IV SOLN
INTRAVENOUS | Status: DC
  Administered 2011-07-06: 15 ug via INTRAVENOUS
  Administered 2011-07-06: 14:00:00 via INTRAVENOUS
  Administered 2011-07-07: 75 ug via INTRAVENOUS
  Administered 2011-07-07: 60 ug via INTRAVENOUS
  Administered 2011-07-07: 90 ug via INTRAVENOUS
  Administered 2011-07-07: 105 ug via INTRAVENOUS
  Administered 2011-07-07: 45 ug via INTRAVENOUS
  Administered 2011-07-07: 60 ug via INTRAVENOUS
  Administered 2011-07-07: 13:00:00 via INTRAVENOUS
  Administered 2011-07-08: 60 ug via INTRAVENOUS
  Administered 2011-07-08: 20:00:00 via INTRAVENOUS
  Administered 2011-07-08: 45 ug via INTRAVENOUS
  Administered 2011-07-08: 180 ug via INTRAVENOUS
  Administered 2011-07-08: 90 ug via INTRAVENOUS
  Administered 2011-07-09: 30 ug via INTRAVENOUS
  Administered 2011-07-09: 75 ug via INTRAVENOUS
  Administered 2011-07-09: 120 ug via INTRAVENOUS
  Administered 2011-07-09: 60 ug via INTRAVENOUS
  Administered 2011-07-09: 15 ug via INTRAVENOUS
  Administered 2011-07-09: 60 ug via INTRAVENOUS
  Administered 2011-07-10: 118.4 ug via INTRAVENOUS
  Administered 2011-07-10: 45 ug via INTRAVENOUS
  Administered 2011-07-10: 30 ug via INTRAVENOUS
  Administered 2011-07-10: 15 ug via INTRAVENOUS
  Administered 2011-07-10: 45 ug via INTRAVENOUS
  Administered 2011-07-11: 60 ug via INTRAVENOUS
  Administered 2011-07-11: 20 ug via INTRAVENOUS
  Administered 2011-07-11: 120 ug via INTRAVENOUS
  Administered 2011-07-11: 22:00:00 via INTRAVENOUS
  Administered 2011-07-11: 15 ug via INTRAVENOUS
  Administered 2011-07-12: 105 ug via INTRAVENOUS
  Administered 2011-07-12: 102.2 ug via INTRAVENOUS
  Administered 2011-07-12: 105 ug via INTRAVENOUS
  Filled 2011-07-06 (×7): qty 50

## 2011-07-06 MED ORDER — BUPIVACAINE 0.5 % ON-Q PUMP SINGLE CATH 400 ML
400.0000 mL | INJECTION | Status: AC
  Filled 2011-07-06: qty 400

## 2011-07-06 MED ORDER — HYDROMORPHONE HCL PF 1 MG/ML IJ SOLN
INTRAMUSCULAR | Status: AC
  Filled 2011-07-06: qty 1

## 2011-07-06 MED ORDER — ACETAMINOPHEN 10 MG/ML IV SOLN
1000.0000 mg | Freq: Four times a day (QID) | INTRAVENOUS | Status: AC
  Administered 2011-07-06 – 2011-07-07 (×4): 1000 mg via INTRAVENOUS
  Filled 2011-07-06 (×4): qty 100

## 2011-07-06 MED ORDER — HYDROMORPHONE HCL PF 1 MG/ML IJ SOLN
0.2500 mg | INTRAMUSCULAR | Status: DC | PRN
  Administered 2011-07-06: 0.5 mg via INTRAVENOUS
  Administered 2011-07-06 (×3): 0.25 mg via INTRAVENOUS
  Administered 2011-07-06: 0.5 mg via INTRAVENOUS
  Administered 2011-07-06: 0.25 mg via INTRAVENOUS

## 2011-07-06 MED ORDER — MIDAZOLAM HCL 5 MG/5ML IJ SOLN
INTRAMUSCULAR | Status: DC | PRN
  Administered 2011-07-06: 2 mg via INTRAVENOUS

## 2011-07-06 MED ORDER — GLYCOPYRROLATE 0.2 MG/ML IJ SOLN
INTRAMUSCULAR | Status: DC | PRN
  Administered 2011-07-06: 0.6 mg via INTRAVENOUS

## 2011-07-06 MED ORDER — IPRATROPIUM BROMIDE 0.02 % IN SOLN
0.5000 mg | RESPIRATORY_TRACT | Status: DC | PRN
  Administered 2011-07-07 – 2011-07-11 (×3): 0.5 mg via RESPIRATORY_TRACT
  Filled 2011-07-06 (×3): qty 2.5

## 2011-07-06 MED ORDER — OXYCODONE-ACETAMINOPHEN 5-325 MG PO TABS
1.0000 | ORAL_TABLET | ORAL | Status: DC | PRN
  Filled 2011-07-06 (×2): qty 2
  Filled 2011-07-06: qty 1
  Filled 2011-07-06 (×2): qty 2

## 2011-07-06 MED ORDER — SODIUM CHLORIDE 0.9 % IJ SOLN: 9.0000 mL | INTRAMUSCULAR | Status: DC | PRN

## 2011-07-06 MED ORDER — FAMOTIDINE 20 MG PO TABS
20.0000 mg | ORAL_TABLET | Freq: Every day | ORAL | Status: DC
  Administered 2011-07-07 – 2011-07-15 (×9): 20 mg via ORAL
  Filled 2011-07-06 (×11): qty 1

## 2011-07-06 MED ORDER — HEMOSTATIC AGENTS (NO CHARGE) OPTIME
TOPICAL | Status: DC | PRN
  Administered 2011-07-06: 1 via TOPICAL

## 2011-07-06 MED ORDER — FENTANYL CITRATE 0.05 MG/ML IJ SOLN
INTRAMUSCULAR | Status: DC | PRN
  Administered 2011-07-06: 50 ug via INTRAVENOUS
  Administered 2011-07-06: 150 ug via INTRAVENOUS
  Administered 2011-07-06 (×2): 50 ug via INTRAVENOUS
  Administered 2011-07-06: 100 ug via INTRAVENOUS
  Administered 2011-07-06 (×2): 50 ug via INTRAVENOUS

## 2011-07-06 MED ORDER — FAMOTIDINE 20 MG PO TABS: 20.0000 mg | ORAL_TABLET | Freq: Every day | ORAL | Status: DC

## 2011-07-06 MED ORDER — PHENYLEPHRINE HCL 10 MG/ML IJ SOLN
10.0000 mg | INTRAMUSCULAR | Status: DC | PRN
  Administered 2011-07-06: 10 ug/min via INTRAVENOUS

## 2011-07-06 MED ORDER — IPRATROPIUM-ALBUTEROL 0.5-2.5 (3) MG/3ML IN SOLN: 3.0000 mL | RESPIRATORY_TRACT | Status: DC | PRN

## 2011-07-06 MED ORDER — FLUTICASONE-SALMETEROL 500-50 MCG/DOSE IN AEPB
2.0000 | INHALATION_SPRAY | Freq: Every day | RESPIRATORY_TRACT | Status: DC
  Administered 2011-07-07 – 2011-07-15 (×9): 2 via RESPIRATORY_TRACT
  Filled 2011-07-06: qty 14

## 2011-07-06 MED ORDER — NALOXONE HCL 0.4 MG/ML IJ SOLN
0.4000 mg | INTRAMUSCULAR | Status: DC | PRN
  Filled 2011-07-06: qty 1

## 2011-07-06 MED ORDER — SENNOSIDES-DOCUSATE SODIUM 8.6-50 MG PO TABS
1.0000 | ORAL_TABLET | Freq: Every evening | ORAL | Status: DC | PRN
  Filled 2011-07-06: qty 1

## 2011-07-06 MED ORDER — BISACODYL 5 MG PO TBEC
10.0000 mg | DELAYED_RELEASE_TABLET | Freq: Every day | ORAL | Status: DC
  Administered 2011-07-06 – 2011-07-12 (×7): 10 mg via ORAL
  Filled 2011-07-06 (×7): qty 2

## 2011-07-06 MED ORDER — BISACODYL 5 MG PO TBEC: 10.0000 mg | DELAYED_RELEASE_TABLET | Freq: Every day | ORAL | Status: DC

## 2011-07-06 MED ORDER — BUPIVACAINE ON-Q PAIN PUMP (FOR ORDER SET NO CHG)
INJECTION | Status: DC
  Filled 2011-07-06: qty 1

## 2011-07-06 MED ORDER — DEXTROSE 5 % IV SOLN
1.5000 g | Freq: Two times a day (BID) | INTRAVENOUS | Status: AC
  Administered 2011-07-06 – 2011-07-07 (×2): 1.5 g via INTRAVENOUS
  Filled 2011-07-06 (×2): qty 1.5

## 2011-07-06 MED ORDER — OXYCODONE HCL 5 MG PO TABS
5.0000 mg | ORAL_TABLET | ORAL | Status: AC | PRN
  Administered 2011-07-07: 10 mg via ORAL
  Filled 2011-07-06 (×2): qty 2

## 2011-07-06 MED ORDER — NALOXONE HCL 0.4 MG/ML IJ SOLN
INTRAMUSCULAR | Status: AC
  Filled 2011-07-06: qty 1

## 2011-07-06 MED ORDER — ALBUTEROL SULFATE (5 MG/ML) 0.5% IN NEBU
2.5000 mg | INHALATION_SOLUTION | RESPIRATORY_TRACT | Status: DC | PRN
  Administered 2011-07-07 – 2011-07-11 (×4): 2.5 mg via RESPIRATORY_TRACT
  Filled 2011-07-06 (×4): qty 0.5

## 2011-07-06 MED ORDER — ONDANSETRON HCL 4 MG/2ML IJ SOLN
INTRAMUSCULAR | Status: DC | PRN
  Administered 2011-07-06: 4 mg via INTRAVENOUS

## 2011-07-06 MED ORDER — ROCURONIUM BROMIDE 100 MG/10ML IV SOLN
INTRAVENOUS | Status: DC | PRN
  Administered 2011-07-06: 10 mg via INTRAVENOUS
  Administered 2011-07-06: 50 mg via INTRAVENOUS
  Administered 2011-07-06 (×2): 10 mg via INTRAVENOUS

## 2011-07-06 MED ORDER — DIPHENHYDRAMINE HCL 12.5 MG/5ML PO ELIX
12.5000 mg | ORAL_SOLUTION | Freq: Four times a day (QID) | ORAL | Status: DC | PRN
  Filled 2011-07-06: qty 5

## 2011-07-06 MED ORDER — NEOSTIGMINE METHYLSULFATE 1 MG/ML IJ SOLN
INTRAMUSCULAR | Status: DC | PRN
  Administered 2011-07-06: 4 mg via INTRAVENOUS

## 2011-07-06 MED ORDER — PROPOFOL 10 MG/ML IV EMUL
INTRAVENOUS | Status: DC | PRN
  Administered 2011-07-06: 280 mg via INTRAVENOUS
  Administered 2011-07-06: 120 mg via INTRAVENOUS

## 2011-07-06 MED ORDER — DEXTROSE-NACL 5-0.9 % IV SOLN
INTRAVENOUS | Status: DC
  Administered 2011-07-06: 125 mL via INTRAVENOUS
  Administered 2011-07-07: 03:00:00 via INTRAVENOUS

## 2011-07-06 MED ORDER — OXYCODONE-ACETAMINOPHEN 5-325 MG PO TABS
1.0000 | ORAL_TABLET | ORAL | Status: DC | PRN
  Administered 2011-07-07 – 2011-07-11 (×11): 2 via ORAL
  Administered 2011-07-11: 1 via ORAL
  Filled 2011-07-06 (×7): qty 2

## 2011-07-06 MED ORDER — ONDANSETRON HCL 4 MG/2ML IJ SOLN: 4.0000 mg | Freq: Four times a day (QID) | INTRAMUSCULAR | Status: DC | PRN

## 2011-07-06 MED ORDER — POTASSIUM CHLORIDE 10 MEQ/50ML IV SOLN
10.0000 meq | Freq: Every day | INTRAVENOUS | Status: DC | PRN
  Administered 2011-07-13 (×3): 10 meq via INTRAVENOUS
  Filled 2011-07-06: qty 150
  Filled 2011-07-06 (×3): qty 50

## 2011-07-06 MED ORDER — DIPHENHYDRAMINE HCL 50 MG/ML IJ SOLN
12.5000 mg | Freq: Four times a day (QID) | INTRAMUSCULAR | Status: DC | PRN
  Filled 2011-07-06: qty 0.25

## 2011-07-06 MED ORDER — LACTATED RINGERS IV SOLN
INTRAVENOUS | Status: DC | PRN
  Administered 2011-07-06 (×3): via INTRAVENOUS

## 2011-07-06 MED ORDER — ONDANSETRON HCL 4 MG/2ML IJ SOLN
4.0000 mg | Freq: Four times a day (QID) | INTRAMUSCULAR | Status: DC | PRN
  Administered 2011-07-07 – 2011-07-12 (×3): 4 mg via INTRAVENOUS
  Filled 2011-07-06 (×4): qty 2

## 2011-07-06 SURGICAL SUPPLY — 81 items
APPLIER CLIP ROT 10 11.4 M/L (STAPLE) ×2
APR CLP MED LRG 11.4X10 (STAPLE) ×1
BLADE SURG 10 STRL SS (BLADE) ×1 IMPLANT
CANISTER SUCTION 2500CC (MISCELLANEOUS) ×4 IMPLANT
CATH KIT ON Q 5IN SLV (PAIN MANAGEMENT) ×1 IMPLANT
CATH THORACIC 28FR (CATHETERS) IMPLANT
CATH THORACIC 28FR RT ANG (CATHETERS) ×2 IMPLANT
CATH THORACIC 36FR (CATHETERS) IMPLANT
CATH THORACIC 36FR RT ANG (CATHETERS) IMPLANT
CLIP APPLIE ROT 10 11.4 M/L (STAPLE) IMPLANT
CLIP TI MEDIUM 6 (CLIP) ×3 IMPLANT
CLOTH BEACON ORANGE TIMEOUT ST (SAFETY) ×2 IMPLANT
CONN ST 1/4X3/8  BEN (MISCELLANEOUS) ×3
CONN ST 1/4X3/8 BEN (MISCELLANEOUS) IMPLANT
CONN Y 3/8X3/8X3/8  BEN (MISCELLANEOUS) ×1
CONN Y 3/8X3/8X3/8 BEN (MISCELLANEOUS) ×1 IMPLANT
CONT SPEC 4OZ CLIKSEAL STRL BL (MISCELLANEOUS) ×4 IMPLANT
DRAPE LAPAROSCOPIC ABDOMINAL (DRAPES) ×2 IMPLANT
DRAPE SLUSH MACHINE 52X66 (DRAPES) IMPLANT
DRAPE SLUSH/WARMER DISC (DRAPES) IMPLANT
ELECT REM PT RETURN 9FT ADLT (ELECTROSURGICAL) ×2
ELECTRODE REM PT RTRN 9FT ADLT (ELECTROSURGICAL) ×1 IMPLANT
GLOVE BIO SURGEON STRL SZ 6.5 (GLOVE) ×1 IMPLANT
GLOVE BIOGEL PI IND STRL 6 (GLOVE) IMPLANT
GLOVE BIOGEL PI INDICATOR 6 (GLOVE) ×2
GLOVE EUDERMIC 7 POWDERFREE (GLOVE) ×4 IMPLANT
GOWN PREVENTION PLUS XLARGE (GOWN DISPOSABLE) ×2 IMPLANT
GOWN STRL NON-REIN LRG LVL3 (GOWN DISPOSABLE) ×7 IMPLANT
HANDLE STAPLE ENDO GIA SHORT (STAPLE) ×1
HEMOSTAT SURGICEL 2X14 (HEMOSTASIS) ×1 IMPLANT
KIT BASIN OR (CUSTOM PROCEDURE TRAY) ×2 IMPLANT
KIT ROOM TURNOVER OR (KITS) ×2 IMPLANT
KIT SUCTION CATH 14FR (SUCTIONS) ×2 IMPLANT
NS IRRIG 1000ML POUR BTL (IV SOLUTION) ×4 IMPLANT
PACK CHEST (CUSTOM PROCEDURE TRAY) ×2 IMPLANT
PAD ARMBOARD 7.5X6 YLW CONV (MISCELLANEOUS) ×4 IMPLANT
POUCH ENDO CATCH II 15MM (MISCELLANEOUS) ×1 IMPLANT
RELOAD EGIA 45 MED/THCK PURPLE (STAPLE) ×1 IMPLANT
RELOAD EGIA 45 TAN VASC (STAPLE) ×5 IMPLANT
RELOAD EGIA 60 MED/THCK PURPLE (STAPLE) ×6 IMPLANT
RELOAD EGIA TRIS TAN 45 CVD (STAPLE) ×4 IMPLANT
RELOAD STAPLE 45 TAN MED CVD (STAPLE) IMPLANT
RELOAD STAPLE 60 MED/THCK ART (STAPLE) IMPLANT
SEALANT PROGEL (MISCELLANEOUS) IMPLANT
SEALANT SURG COSEAL 4ML (VASCULAR PRODUCTS) IMPLANT
SEALANT SURG COSEAL 8ML (VASCULAR PRODUCTS) IMPLANT
SOLUTION ANTI FOG 6CC (MISCELLANEOUS) ×4 IMPLANT
SPECIMEN JAR MEDIUM (MISCELLANEOUS) ×2 IMPLANT
SPONGE GAUZE 4X4 12PLY (GAUZE/BANDAGES/DRESSINGS) ×2 IMPLANT
SPONGE INTESTINAL PEANUT (DISPOSABLE) ×2 IMPLANT
STAPLER ENDO GIA 12 SHRT THIN (STAPLE) IMPLANT
STAPLER ENDO GIA 12MM SHORT (STAPLE) ×1 IMPLANT
SUT PROLENE 4 0 RB 1 (SUTURE) ×2
SUT PROLENE 4-0 RB1 .5 CRCL 36 (SUTURE) IMPLANT
SUT SILK  1 MH (SUTURE) ×3
SUT SILK 1 MH (SUTURE) ×2 IMPLANT
SUT SILK 2 0SH CR/8 30 (SUTURE) IMPLANT
SUT SILK 3 0SH CR/8 30 (SUTURE) IMPLANT
SUT VIC AB 1 CTX 36 (SUTURE) ×4
SUT VIC AB 1 CTX36XBRD ANBCTR (SUTURE) IMPLANT
SUT VIC AB 2-0 CTX 36 (SUTURE) ×2 IMPLANT
SUT VIC AB 2-0 UR6 27 (SUTURE) ×1 IMPLANT
SUT VIC AB 3-0 MH 27 (SUTURE) IMPLANT
SUT VIC AB 3-0 SH 27 (SUTURE) ×2
SUT VIC AB 3-0 SH 27X BRD (SUTURE) IMPLANT
SUT VIC AB 3-0 X1 27 (SUTURE) ×4 IMPLANT
SUT VICRYL 2 TP 1 (SUTURE) ×1 IMPLANT
SWAB COLLECTION DEVICE MRSA (MISCELLANEOUS) IMPLANT
SYSTEM SAHARA CHEST DRAIN ATS (WOUND CARE) ×2 IMPLANT
TAPE CLOTH 4X10 WHT NS (GAUZE/BANDAGES/DRESSINGS) ×2 IMPLANT
TAPE CLOTH SURG 4X10 WHT LF (GAUZE/BANDAGES/DRESSINGS) ×1 IMPLANT
TIP APPLICATOR SPRAY EXTEND 16 (VASCULAR PRODUCTS) IMPLANT
TOWEL OR 17X24 6PK STRL BLUE (TOWEL DISPOSABLE) ×2 IMPLANT
TOWEL OR 17X26 10 PK STRL BLUE (TOWEL DISPOSABLE) ×3 IMPLANT
TRAP SPECIMEN MUCOUS 40CC (MISCELLANEOUS) IMPLANT
TRAY FOLEY CATH 14FRSI W/METER (CATHETERS) ×2 IMPLANT
TUBE ANAEROBIC SPECIMEN COL (MISCELLANEOUS) IMPLANT
TUBE SUCT ARGYLE STRL (TUBING) ×1 IMPLANT
TUNNELER SHEATH ON-Q 11GX8 (MISCELLANEOUS) ×2 IMPLANT
WATER STERILE IRR 1000ML POUR (IV SOLUTION) ×4 IMPLANT
YANKAUER SUCT BULB TIP NO VENT (SUCTIONS) ×1 IMPLANT

## 2011-07-06 NOTE — H&P (Signed)
PCP is BUTLER,CYNTHIA, DO, [name]  Referring Provider is Shawna Jester, [name]  Chief Complaint   Patient presents with   .  Lung Lesion     Referral from Dr Charm Barges for eval on RUL nodule, Chest CT 05/10/2011   HPI: Shawna [name] is a 66 year old woman who presents with chief complaint of a lung nodule.  Shawna [name] is a 66 year old woman with a history of tobacco abuse and COPD, she complains of a cough, chest tightness, and shortness of breath dating back about a month. She's had bronchitis before these symptoms were similar so she didn't immediately seek medical attention. She says it started with nasal and sinus congestion and then moved down to her chest. She does note that her chest tightness and shortness of breath with exertion have been worse recently particularly when she walks up stairs. She saw Shawna [name] who recommended a chest x-ray, she initially did not want to [name] the x-ray but a week later when her symptoms were persistent she went ahead and had the x-ray done and it showed a new right upper lobe shadow. A CT of the chest was done which showed a spiculated 1.4 x 1.2 x 1.0 cm right upper lobe mass. There is no hilar or mediastinal adenopathy. There was evidence of emphysema. There also was noted significant calcification in the aorta and coronary arteries.  Past Medical History   Diagnosis  Date   .  COPD (chronic obstructive pulmonary disease)    .  HTN (hypertension)    .  B12 deficiency    .  OA (osteoarthritis)    .  Hypokalemia    No past surgical history on file.  Family history  Significant for her mother having cardiovascular disease  Social History  History   Substance Use Topics   .  Smoking status:  Current Everyday Smoker -- 20.0 packs/day     Types:  Cigarettes   .  Smokeless tobacco:  Not on file   .  Alcohol Use:  Not on file    Current Outpatient Prescriptions   Medication  Sig  Dispense  Refill   .  albuterol (PROVENTIL HFA;VENTOLIN HFA) 108 (90 BASE) MCG/ACT  inhaler  Inhale 2 puffs into the lungs every 6 (six) hours as needed.     .  Fluticasone-Salmeterol (ADVAIR) 500-50 MCG/DOSE AEPB  Inhale 1 puff into the lungs every 12 (twelve) hours.     Marland Kitchen  ipratropium-albuterol (DUONEB) 0.5-2.5 (3) MG/3ML SOLN  Take 3 mLs by nebulization every 4 (four) hours as needed.     Marland Kitchen  KLOR-CON M20 20 MEQ tablet  Take 20 mEq by mouth daily.     Marland Kitchen  lisinopril-hydrochlorothiazide (PRINZIDE,ZESTORETIC) 10-12.5 MG per tablet  Take 1 tablet by mouth daily.     .  meloxicam (MOBIC) 15 MG tablet  Take 15 mg by mouth daily.     No Known Allergies  Review of Systems  Constitutional: Positive for activity change. Negative for fever, chills, appetite change and unexpected weight change.  HENT: Positive for congestion.  Eyes: Negative.  Respiratory: Positive for cough (nonproductive), chest tightness, shortness of breath (with exertion) and wheezing. Negative for stridor.  Cardiovascular: Positive for chest pain. Negative for leg swelling.  Genitourinary: Positive for vaginal discharge.  Musculoskeletal: Positive for back pain, joint swelling and arthralgias.  Neurological: Negative.  Hematological: Negative.  All other systems reviewed and are negative.  BP 160/97  Pulse 92  Resp 18  Ht 5\' 6"  (1.676 m)  Wt 135 lb (61.236 kg)  BMI 21.79 kg/m2  SpO2 98%  Physical Exam  Constitutional: She is oriented to person, place, and time. She appears well-developed and well-nourished. No distress.  HENT:  Head: Normocephalic and atraumatic.  Eyes: EOM are normal. Pupils are equal, round, and reactive to light.  Neck: Neck supple. No JVD present. No tracheal deviation present. No thyromegaly present.  Well healed scar  Cardiovascular: Normal rate, regular rhythm, normal heart sounds and intact distal pulses. Exam reveals no gallop and no friction rub.  No murmur heard.  Pulmonary/Chest: Effort normal and breath sounds normal. She has no wheezes. She has no rales.  Abdominal:  Soft. There is no tenderness.  Musculoskeletal: Normal range of motion. She exhibits no edema.  Lymphadenopathy:  She has no cervical adenopathy.  Neurological: She is alert and oriented to person, place, and time.  No focal motor deficits  Skin: Skin is warm and dry.  Psychiatric: She has a normal mood and affect.  Diagnostic Tests:  Chest x-ray and CT of the chest were reviewed. Findings as previously noted  Impression:  66 year old smoker with a newly discovered right upper lobe nodule which is highly suspicious for bronchogenic carcinoma. This has to be considered a lung cancer to be proven otherwise. Unfortunately the lesion is very central in the right upper lobe and is not amenable to wedge resection. Therefore I think we should proceed with a PET/CT. At the PET CT is positive I would recommend proceeding with a right upper lobectomy and, if the lesion were positive, mediastinal lymph node dissection. If the PET CT was negative, then I would recommend ENB. We will order the PET CT to be done as soon as possible  In the meantime, there are a couple of other issues related to her fitness for surgery.  1. She needs pulmonary function testing, we will go ahead and schedule that  2. She needs preoperative cardiology evaluation-she is at high risk to have coronary disease given her smoking, family history, and hypertension. She has symptoms that are as likely, or more likely, to be angina as to be due to COPD and bronchitis. And on top of that she has evidence of some calcified plaque in her coronary arteries on CT scan.  Plan:  1. PET/CT  2. PFT  3. Cardiology consultation in [place]  4. I will see her back once the above evaluations have been done to discuss our next step   Patient ID: Shawna [name], female DOB: June 23, 1945, 66 y.o. MRN: [id number]  Shawna [name] is a 66 year old woman with a history of tobacco abuse who was seen in the office in February regarding a new right upper lobe nodule.  This was a 1.4 x 1.2 x 1.0 cm spiculated nodule located centrally in the right upper lobe.  I recommended that Shawna [name] we proceed with evaluation for possible surgical resection with a PET/CT, PFT and cardiology evaluation.  She was seen by [name] and had a pharmacologic nuclear scan which was a low risk study.  PET/CT showed the right upper lobe nodule to be hypermetabolic with an SUV of 2.9, there is no evidence of regional or distant metastases  Pulmonary function testing showed moderate COPD and FEV1 of 1.68 (64% of predicted) which improved to 1.86 with bronchodilators. DLCO is 50%  I discussed the indications of these results with Shawna [name]. She understands that has been out this is clinically a stage IA lesion and potentially curable it is in  fact bronchogenic carcinoma. She does understand there is a possibility this is not a lung cancer, but that is unlikely and can only be definitively proven with excisional biopsy. Bronchoscopic or percutaneous biopsy could have false-negative results and could not definitively rule out this as a cancer. Therefore I recommended to her that we proceed with a right VATS and right upper lobectomy with lymph node dissection for definitive diagnosis and treatment. This lesion is very centrally located and is not amenable to wedge resection prior to lobectomy.  I discussed with her the general nature of the procedure, incision to be used, need for general anesthesia, expected outcomes. She understands that even with surgical resection if there is a risk of recurrence lung cancer and she would need to continue to be followed. She understands the risk of surgery include but are not limited to death, stroke, MI, DVT, PE, bleeding, possible need for transfusions, infections, prolonged air leak, as well as other unforeseeable complications. She is very anxious about the possibility of having surgery but did agree to proceed.  We will plan to proceed with surgery on  Monday, April 15.

## 2011-07-06 NOTE — Transfer of Care (Signed)
Immediate Anesthesia Transfer of Care Note  Patient: Shawna [name]  Procedure(s) Performed: Procedure(s) (LRB): VIDEO ASSISTED THORACOSCOPY (VATS)/ LOBECTOMY (Right)  Patient Location: PACU  Anesthesia Type: General  Level of Consciousness: awake  Airway & Oxygen Therapy: Patient Spontanous Breathing  Post-op Assessment: Report given to PACU RN  Post vital signs: stable  Complications: No apparent anesthesia complications

## 2011-07-06 NOTE — Preoperative (Signed)
Beta Blockers   Reason not to administer Beta Blockers:Not Applicable 

## 2011-07-06 NOTE — Anesthesia Preprocedure Evaluation (Addendum)
Anesthesia Evaluation  Patient identified by MRN, date of birth, ID band Patient awake    Reviewed: Allergy & Precautions, H&P , NPO status , Patient's Chart, lab work & pertinent test results, reviewed documented beta blocker date and time   Airway Mallampati: I TM Distance: <3 FB     Dental  (+) Teeth Intact and Chipped   Pulmonary COPD COPD inhaler, Current Smoker,  breath sounds clear to auscultation        Cardiovascular hypertension, Pt. on medications Rhythm:Regular Rate:Normal     Neuro/Psych  Neuromuscular disease    GI/Hepatic GERD-  Medicated,  Endo/Other    Renal/GU      Musculoskeletal  (+) Arthritis -, Osteoarthritis,    Abdominal (+)  Abdomen: soft. Bowel sounds: normal.  Peds  Hematology   Anesthesia Other Findings   Reproductive/Obstetrics                         Anesthesia Physical Anesthesia Plan  ASA: III  Anesthesia Plan: General   Post-op Pain Management:    Induction: Intravenous  Airway Management Planned: Double Lumen EBT  Additional Equipment: Arterial line and CVP  Intra-op Plan:   Post-operative Plan: Extubation in OR  Informed Consent: I have reviewed the patients History and Physical, chart, labs and discussed the procedure including the risks, benefits and alternatives for the proposed anesthesia with the patient or authorized representative who has indicated his/her understanding and acceptance.     Plan Discussed with: CRNA and Surgeon  Anesthesia Plan Comments:         Anesthesia Quick Evaluation

## 2011-07-06 NOTE — Interval H&P Note (Signed)
History and Physical Interval Note:  07/06/2011 7:29 AM  Shawna [name]  has presented today for surgery, with the diagnosis of RUL MASS  The various methods of treatment have been discussed with the patient and family. After consideration of risks, benefits and other options for treatment, the patient has consented to  Procedure(s) (LRB): VIDEO ASSISTED THORACOSCOPY (VATS)/ LOBECTOMY (Right) as a surgical intervention .  The patients' history has been reviewed, patient examined, no change in status, stable for surgery.  I have reviewed the patients' chart and labs.  Questions were answered to the patient's satisfaction.     Shawna [name] C

## 2011-07-06 NOTE — Progress Notes (Signed)
[facility] BRIEF SICU PROGRESS NOTE  Day of Surgery  S/P Procedure(s) (LRB): VIDEO ASSISTED THORACOSCOPY (VATS)/ LOBECTOMY (Right)   Feels okay.  Sore but adequate analgesia Breathing comfortably with O2 sats 95% on 2 L/min via [place] UOP adequate Chest tube output low + air leak  Plan: Continue routine postop  [name] 07/06/2011 7:30 PM

## 2011-07-06 NOTE — Anesthesia Procedure Notes (Signed)
Procedure Name: Intubation Date/Time: 07/06/2011 8:32 AM Performed by: Ellin Goodie Pre-anesthesia Checklist: Patient identified, Emergency Drugs available, Suction available, Patient being monitored and Timeout performed Patient Re-evaluated:Patient Re-evaluated prior to inductionOxygen Delivery Method: Circle system utilized Preoxygenation: Pre-oxygenation with 100% oxygen Intubation Type: IV induction Ventilation: Mask ventilation without difficulty Laryngoscope Size: Mac and 3 Grade View: Grade II Tube type: Oral Endobronchial tube: Double lumen EBT and Left and 37 Fr Number of attempts: 2 Airway Equipment and Method: Stylet Placement Confirmation: ETT inserted through vocal cords under direct vision,  positive ETCO2 and breath sounds checked- equal and bilateral Secured at: 31 cm Tube secured with: Tape Dental Injury: Teeth and Oropharynx as per pre-operative assessment

## 2011-07-06 NOTE — Brief Op Note (Signed)
                   [address]            [place] [address]          [phone]    07/06/2011  12:30 PM  PATIENT:  Shawna [name]  66 y.o. female  PRE-OPERATIVE DIAGNOSIS:  Right Upper Lobe Mass  POST-OPERATIVE DIAGNOSIS:  Right Upper Lobe Mass  PROCEDURE:  Procedure(s): VIDEO ASSISTED THORACOSCOPY (VATS)/MINI-THORACOTOMY WITH  LOBECTOMY (RUL),LND  SURGEON:  Surgeon(s): [name], MD  PHYSICIAN ASSISTANT: Laticia Vannostrand PA-C  ANESTHESIA:   general  SPECIMEN:  Source of Specimen:  RUL  DISPOSITION OF SPECIMEN:  Pathology  DRAINS: 2 CHEST TUBES   PATIENT CONDITION:  PACU - hemodynamically stable.  COMPLICATIONS: NO KNOWN

## 2011-07-06 NOTE — Anesthesia Postprocedure Evaluation (Signed)
Anesthesia Post Note  Patient: Shawna [name]  Procedure(s) Performed: Procedure(s) (LRB): VIDEO ASSISTED THORACOSCOPY (VATS)/ LOBECTOMY (Right)  Anesthesia type: General  Patient location: PACU  Post pain: Pain level controlled and Adequate analgesia  Post assessment: Post-op Vital signs reviewed, Patient's Cardiovascular Status Stable, Respiratory Function Stable, Patent Airway and Pain level controlled  Last Vitals:  Filed Vitals:   07/06/11 1430  BP:   Pulse: 72  Temp:   Resp: 21    Post vital signs: Reviewed and stable  Level of consciousness: awake, alert  and oriented  Complications: No apparent anesthesia complications

## 2011-07-07 ENCOUNTER — Inpatient Hospital Stay (HOSPITAL_COMMUNITY): Payer: Medicare Other

## 2011-07-07 LAB — POCT I-STAT 3, ART BLOOD GAS (G3+)
TCO2: 27 mmol/L (ref 0–100)
pCO2 arterial: 45.8 mmHg — ABNORMAL HIGH (ref 35.0–45.0)
pH, Arterial: 7.353 (ref 7.350–7.400)

## 2011-07-07 LAB — CBC
HCT: 38 % (ref 36.0–46.0)
Platelets: 129 10*3/uL — ABNORMAL LOW (ref 150–400)
RDW: 13.5 % (ref 11.5–15.5)
WBC: 5.7 10*3/uL (ref 4.0–10.5)

## 2011-07-07 LAB — BASIC METABOLIC PANEL
BUN: 17 mg/dL (ref 6–23)
Chloride: 103 mEq/L (ref 96–112)
GFR calc Af Amer: 79 mL/min — ABNORMAL LOW (ref >90)
GFR calc non Af Amer: 68 mL/min — ABNORMAL LOW (ref >90)
Potassium: 3.7 mEq/L (ref 3.5–5.1)
Sodium: 135 mEq/L (ref 135–145)

## 2011-07-07 MED ORDER — POTASSIUM CHLORIDE 10 MEQ/50ML IV SOLN
10.0000 meq | INTRAVENOUS | Status: AC | PRN
  Administered 2011-07-07 (×3): 10 meq via INTRAVENOUS

## 2011-07-07 NOTE — Op Note (Signed)
NAMEJADORE, Shawna [name]              ACCOUNT NO.:  [id number]  MEDICAL RECORD NO.:  [id number]  LOCATION:  2316                         FACILITY:  [facility]  PHYSICIAN:  Salvatore Decent. Dorris Fetch, M.D.DATE OF BIRTH:  13-Dec-1945  DATE OF PROCEDURE:  07/06/2011 DATE OF DISCHARGE:                              OPERATIVE REPORT   PREOPERATIVE DIAGNOSIS:  Hypermetabolic nodule, right upper lobe.  POSTOPERATIVE DIAGNOSIS:  Hypermetabolic nodule, right upper lobe.  PROCEDURE:  Right video-assisted thoracoscopy, right upper lobectomy.  SURGEON:  Salvatore Decent. Dorris Fetch, MD  ASSISTANT:  Rowe Clack, PA-C  ANESTHESIA:  General.  FINDINGS:  Right upper lobe removed without difficulty.  No definite palpable mass within the lobe.  Frozen section revealed clear bronchial margin, normal-appearing lymph nodes sent for permanent pathology.  CLINICAL NOTE:  Shawna [name] is a 66 year old smoker who recently was found to have a right upper lobe nodule.  A PET scan was done and this was mildly hypermetabolic with an SUV of 2.9.  There was no evidence of hypermetabolic lesions elsewhere.  Differential diagnosis was discussed with the patient. And given that this was spiculated and hypermetabolic, it was very suspicious for primary bronchogenic carcinoma.  We discussed potential options including bronchoscopic biopsy versus surgical resection. Given that the lesion was highly suspicious and hypermetabolic, it was recommended that she undergo resection to definitively establish whether or not this was cancer given the possibility of false negative biopsies with navigational bronchoscopy.  The indications, risks, benefits and alternatives were discussed in detail with the patient.  She understood and accepted the risks and agreed to proceed.  OPERATIVE NOTE:  Shawna [name] was brought to the preop holding area on July 06, 2011, there the Anesthesia Service placed a central line and an arterial blood pressure  monitoring line.  PAS hose were placed for DVT prophylaxis.  Intravenous antibiotics were administered.  She was taken to the operating room, anesthetized and intubated with a double- lumen endotracheal tube.  A Foley catheter was placed.  She was placed in the left lateral decubitus position and the right chest was prepped and draped in the usual sterile fashion.  Single lung ventilation of the left lung was carried out and the patient tolerated this well.  An incision was made in the 7th intercostal space in the midaxillary line and was carried through the skin and subcutaneous tissue.  The chest was entered bluntly using a hemostat.  The port was inserted through the incision and a thoracoscope was placed into the chest.  A small port incision was made posteriorly below the tip of the scapula for retraction purposes and a small utility incision was made anterolaterally, this was 6 cm in length.  Rib spreading was only done in order to remove the specimen.  On initial inspection, there was no effusion and there was no evidence of disease of the visceral or parietal pleura.  The upper lobe was grasped with a clamp and palpated and no definite palpable mass was present in the upper lobe, even though it had been confirmed to still be present on the chest x-ray prior to the surgery.  The entire upper lobe, however, did have some nodularity to it  which would make palpating a small nodule difficult.  The minor fissure had thin filmy adhesions which were taken down with electrocautery.  The pleural reflection was divided at the hilum anteriorly exposing the vein, so the middle lobe vein was identified and preserved.  The main trunk of the right upper lobe vein was very closely adherent to the pulmonary artery.  There were smaller segmental branches which were divided with an endoscopic GIA stapler to allow the remainder of the dissection of the main trunk of the vein off the pulmonary  artery, which was done carefully.  Once encircled that vein was likewise divided with endoscopic GIA stapler.  At this point, the intermediate trunk of the pulmonary artery was clearly visible.  The minor fissure was completed with an endoscopic GIA stapler.  Next, the dissection was carried more superiorly and the pulmonary arterial branches to the upper lobe were identified and carefully dissected out.  This was relatively slow and tedious portion of the procedure, but once the arteries had been dissected out, an endoscopic vascular stapler was placed across them and the branches were stapled and divided.  At this point, all the remained attaching the upper lobe was the bronchus as well as a small portion of the major fissure which was incomplete.  Around this time, there was difficulty with the double-lumen tube and there was cross ventilation into the right lung which limited visibility.  Adjustments were made to the double-lumen tube, but we continued to only have intermittent deflation of the lung.  A endoscopic GIA stapler was placed across the bronchus and the remaining small portion of the lung tissue in the major fissure.  It was closed.  The test inflation was done which showed good aeration of the lower and middle lobes.  The stapler was fired.  Initially, the specimen was placed in an endoscopic retrieval bag.  However, this was unsuccessful in removing the specimen.  Ultimately, a laminar spreader was used to slightly separate the ribs to allow removal of the specimen from the chest.  At this point, all the staple lines were inspected for hemostasis which was good.  Level 10 node was identified and taken and sent as a separate specimen for pathology.  It was anthracotic, but otherwise appeared normal.  Also, relatively normal-appearing level 9 node was taken. The subcarinal nodes were relatively large, but otherwise benign appearing.  These were sent as a separate specimen  as well.  At this point, before formal dissection of the 4R nodes, once again difficulty was encountered with a double-lumen tube.  There was cross ventilation, which obscured visualization of the 4R nodes given that there was no definite cancer, I elected not to pursue additional attempts at biopsy.  An ON-Q local anesthetic catheter was placed through a separate stab incision and tunneled in a subpleural location. A 28-French chest tube was placed through a separate port incision and secured with a #1 silk suture.  The 32-French Harrison Mons drain was placed posteriorly and secured.  This was done through the original port incision.  The posterior retraction incision was closed with a #1 Vicryl fascial suture and a 3-0 Vicryl subcuticular suture.  The mini thoracotomy incision was closed with a #1 Vicryl fascial suture, followed by a 2-0 Vicryl subcutaneous suture and a 3-0 Vicryl subcuticular suture.  All sponge, needle and instrument counts were correct at the end of the procedure.  The patient was taken from the operating room to the Postanesthetic Care Unit, extubated and  in good condition.     Salvatore Decent Dorris Fetch, M.D.     SCH/MEDQ  D:  07/06/2011  T:  07/07/2011  Job:  [id number]

## 2011-07-07 NOTE — Progress Notes (Signed)
   CARDIOTHORACIC SURGERY PROGRESS NOTE  1 Day Post-Op  S/P Procedure(s) (LRB): VIDEO ASSISTED THORACOSCOPY (VATS)/ LOBECTOMY (Right)  Subjective: Mild soreness.  Fairly good analgesia.  No other complaints  Objective: Vital signs in last 24 hours: Temp:  [96.9 F (36.1 C)-98.5 F (36.9 C)] 97.5 F (36.4 C) (04/20 0712) Pulse Rate:  [65-94] 81  (04/20 0700) Cardiac Rhythm:  [-] Normal sinus rhythm (04/20 0600) Resp:  [5-23] 18  (04/20 0754) BP: (80-117)/(43-77) 111/64 mmHg (04/20 0700) SpO2:  [88 %-100 %] 96 % (04/20 0754) Arterial Line BP: (92-162)/(49-74) 134/61 mmHg (04/20 0700) Weight:  [62.7 kg (138 lb 3.7 oz)] 62.7 kg (138 lb 3.7 oz) (04/20 0500)  Physical Exam:  Rhythm:   sinus  Breath sounds: clear  Heart sounds:  RRR  Incisions:  Dressings dry  Abdomen:  soft  Extremities:  warm  Chest tube(s):  + air leak, thin serosanguinous output   Intake/Output from previous day: 04/19 0701 - 04/20 0700 In: 4558 [P.O.:60; I.V.:4046; IV Piggyback:452] Out: 2385 [Urine:1115; Blood:400; Chest Tube:870] Intake/Output this shift:    Lab Results:  Basename 07/07/11 0415 07/04/11 1440  WBC 5.7 6.7  HGB 12.8 14.1  HCT 38.0 41.5  PLT 129* 150   BMET:  Basename 07/07/11 0415 07/04/11 1440  NA 135 138  K 3.7 3.7  CL 103 102  CO2 26 23  GLUCOSE 136* 85  BUN 17 23  CREATININE 0.87 1.02  CALCIUM 7.9* 9.9    CBG (last 3)  No results found for this basename: GLUCAP:3 in the last 72 hours  CXR:  Good reexpansion right lung  Assessment/Plan: S/P Procedure(s) (LRB): VIDEO ASSISTED THORACOSCOPY (VATS)/ LOBECTOMY (Right)  Doing well POD1 Mobilize D/C a line D/C foley Decrease IV fluids Transfer 3300  Ketih Goodie H 07/07/2011 8:47 AM

## 2011-07-07 NOTE — Progress Notes (Signed)
[facility] BRIEF SICU PROGRESS NOTE  1 Day Post-Op  S/P Procedure(s) (LRB): VIDEO ASSISTED THORACOSCOPY (VATS)/ LOBECTOMY (Right)   Stable day  Plan: Continue current plan  Purcell Nails 07/07/2011 5:36 PM

## 2011-07-08 ENCOUNTER — Inpatient Hospital Stay (HOSPITAL_COMMUNITY): Payer: Medicare Other

## 2011-07-08 LAB — COMPREHENSIVE METABOLIC PANEL
AST: 19 U/L (ref 0–37)
Albumin: 2.4 g/dL — ABNORMAL LOW (ref 3.5–5.2)
Alkaline Phosphatase: 55 U/L (ref 39–117)
BUN: 12 mg/dL (ref 6–23)
CO2: 28 mEq/L (ref 19–32)
Chloride: 101 mEq/L (ref 96–112)
GFR calc non Af Amer: 87 mL/min — ABNORMAL LOW (ref >90)
Potassium: 3.8 mEq/L (ref 3.5–5.1)
Total Bilirubin: 0.3 mg/dL (ref 0.3–1.2)

## 2011-07-08 LAB — CBC
HCT: 36.1 % (ref 36.0–46.0)
MCV: 90.7 fL (ref 78.0–100.0)
RBC: 3.98 MIL/uL (ref 3.87–5.11)
RDW: 13.5 % (ref 11.5–15.5)
WBC: 6.1 10*3/uL (ref 4.0–10.5)

## 2011-07-08 MED ORDER — ALUM & MAG HYDROXIDE-SIMETH 200-200-20 MG/5ML PO SUSP
15.0000 mL | Freq: Four times a day (QID) | ORAL | Status: DC | PRN
  Administered 2011-07-08 – 2011-07-09 (×2): 15 mL via ORAL
  Filled 2011-07-08: qty 60
  Filled 2011-07-08: qty 30

## 2011-07-08 MED ORDER — ENOXAPARIN SODIUM 40 MG/0.4ML ~~LOC~~ SOLN
40.0000 mg | SUBCUTANEOUS | Status: DC
  Administered 2011-07-09 – 2011-07-15 (×7): 40 mg via SUBCUTANEOUS
  Filled 2011-07-08 (×8): qty 0.4

## 2011-07-08 MED ORDER — ENOXAPARIN SODIUM 40 MG/0.4ML ~~LOC~~ SOLN: 40.0000 mg | SUBCUTANEOUS | Status: DC

## 2011-07-08 NOTE — Progress Notes (Signed)
   CARDIOTHORACIC SURGERY PROGRESS NOTE  2 Days Post-Op  S/P Procedure(s) (LRB): VIDEO ASSISTED THORACOSCOPY (VATS)/ LOBECTOMY (Right)  Subjective: Feels okay other than expected soreness in chest  Objective: Vital signs in last 24 hours: Temp:  [98 F (36.7 C)-98.9 F (37.2 C)] 98.5 F (36.9 C) (04/21 0734) Pulse Rate:  [73-98] 84  (04/21 0600) Cardiac Rhythm:  [-] Normal sinus rhythm (04/21 0800) Resp:  [12-24] 24  (04/21 0828) BP: (88-119)/(39-76) 103/62 mmHg (04/21 0600) SpO2:  [82 %-100 %] 94 % (04/21 0828)  Physical Exam:  Rhythm:   sinus  Breath sounds: Few crackles, no wheezes  Heart sounds:  RRR  Incisions:  Clean and dry  Abdomen:  soft  Extremities:  warm  Chest tube(s):  + air leak, thin serous output   Intake/Output from previous day: 04/20 0701 - 04/21 0700 In: 1047.5 [P.O.:400; I.V.:447.5; IV Piggyback:200] Out: 1860 [Urine:1010; Chest Tube:850] Intake/Output this shift: Total I/O In: 32 [I.V.:32] Out: 100 [Chest Tube:100]  Lab Results:  Basename 07/08/11 0400 07/07/11 0415  WBC 6.1 5.7  HGB 11.9* 12.8  HCT 36.1 38.0  PLT 122* 129*   BMET:  Basename 07/08/11 0400 07/07/11 0415  NA 135 135  K 3.8 3.7  CL 101 103  CO2 28 26  GLUCOSE 106* 136*  BUN 12 17  CREATININE 0.76 0.87  CALCIUM 8.5 7.9*    CBG (last 3)  No results found for this basename: GLUCAP:3 in the last 72 hours  CXR:  Good aeration with stable small PTX with some increase in sub Q air  Assessment/Plan: S/P Procedure(s) (LRB): VIDEO ASSISTED THORACOSCOPY (VATS)/ LOBECTOMY (Right)  Overall stable POD2 Persistent air leak with small PTX/space Increased O2 requirement   Keep tubes to suction today  Mobilize as much as possible  lovenox for DVT prophylaxis  Tu Bayle H 07/08/2011 8:39 AM

## 2011-07-08 NOTE — Progress Notes (Signed)
Report to 3300 RN 

## 2011-07-08 NOTE — Progress Notes (Signed)
Attempted to call report to [id number]

## 2011-07-08 NOTE — Plan of Care (Signed)
Problem: Phase II Progression Outcomes Goal: Tolerating diet Outcome: Progressing Nausea this am

## 2011-07-08 NOTE — Progress Notes (Signed)
Patient transferred by wheelchair on oxygen and monitor to room 3315.  Patient placed in bed, SCDs on, chest tube to suction.  RN to receive in room.

## 2011-07-08 NOTE — Progress Notes (Signed)
Agree with Dupont's assessment

## 2011-07-09 ENCOUNTER — Inpatient Hospital Stay (HOSPITAL_COMMUNITY): Payer: Medicare Other

## 2011-07-09 LAB — TYPE AND SCREEN
ABO/RH(D): A NEG
Antibody Screen: NEGATIVE
Unit division: 0

## 2011-07-09 MED ORDER — WHITE PETROLATUM GEL
Status: AC
  Administered 2011-07-09: 04:00:00
  Filled 2011-07-09: qty 5

## 2011-07-09 MED ORDER — WHITE PETROLATUM GEL
Status: AC
  Filled 2011-07-09: qty 5

## 2011-07-09 NOTE — Progress Notes (Signed)
3 Days Post-Op Procedure(s) (LRB): VIDEO ASSISTED THORACOSCOPY (VATS)/ LOBECTOMY (Right) Subjective: C/o incisional pain Has not ambulated yet today   Objective: Vital signs in last 24 hours: Temp:  [97.7 F (36.5 C)-98.7 F (37.1 C)] 98.2 F (36.8 C) (04/22 0800) Pulse Rate:  [81-110] 90  (04/22 0800) Cardiac Rhythm:  [-] Normal sinus rhythm (04/22 0800) Resp:  [14-24] 19  (04/22 0800) BP: (116-142)/(57-79) 126/72 mmHg (04/22 0800) SpO2:  [80 %-100 %] 95 % (04/22 0919) Weight:  [139 lb 15.9 oz (63.5 kg)] 139 lb 15.9 oz (63.5 kg) (04/21 1435)  Hemodynamic parameters for last 24 hours:    Intake/Output from previous day: 04/21 0701 - 04/22 0700 In: 962 [P.O.:510; I.V.:452] Out: 1558 [Urine:750; Chest Tube:808] Intake/Output this shift: Total I/O In: 440 [P.O.:360; I.V.:80] Out: 400 [Urine:250; Chest Tube:150]  General appearance: alert and no distress Lungs: diminished breath sounds right Abdomen: normal findings: soft, non-tender  Lab Results:  Basename 07/08/11 0400 07/07/11 0415  WBC 6.1 5.7  HGB 11.9* 12.8  HCT 36.1 38.0  PLT 122* 129*   BMET:  Basename 07/08/11 0400 07/07/11 0415  NA 135 135  K 3.8 3.7  CL 101 103  CO2 28 26  GLUCOSE 106* 136*  BUN 12 17  CREATININE 0.76 0.87  CALCIUM 8.5 7.9*    PT/INR: No results found for this basename: LABPROT,INR in the last 72 hours ABG    Component Value Date/Time   PHART 7.353 07/07/2011 0415   HCO3 25.5* 07/07/2011 0415   TCO2 27 07/07/2011 0415   O2SAT 94.0 07/07/2011 0415   CBG (last 3)  No results found for this basename: GLUCAP:3 in the last 72 hours  Assessment/Plan: S/P Procedure(s) (LRB): VIDEO ASSISTED THORACOSCOPY (VATS)/ LOBECTOMY (Right) Mobilize Has only a tiny air leak(about every 3rd cough) CT to water seal Ambulate   LOS: 3 days    Eugene Zeiders C 07/09/2011

## 2011-07-10 ENCOUNTER — Inpatient Hospital Stay (HOSPITAL_COMMUNITY): Payer: Medicare Other

## 2011-07-10 LAB — BASIC METABOLIC PANEL
Calcium: 9 mg/dL (ref 8.4–10.5)
Creatinine, Ser: 0.75 mg/dL (ref 0.50–1.10)
GFR calc Af Amer: >90 mL/min (ref >90)
Sodium: 136 mEq/L (ref 135–145)

## 2011-07-10 LAB — CBC
MCH: 30 pg (ref 26.0–34.0)
MCV: 89.1 fL (ref 78.0–100.0)
Platelets: 173 10*3/uL (ref 150–400)
RDW: 13.3 % (ref 11.5–15.5)
WBC: 8.8 10*3/uL (ref 4.0–10.5)

## 2011-07-10 NOTE — Progress Notes (Signed)
4 Days Post-Op Procedure(s) (LRB): VIDEO ASSISTED THORACOSCOPY (VATS)/ LOBECTOMY (Right) Subjective: "i want to go home" A lot pain when she woke up, better now  Objective: Vital signs in last 24 hours: Temp:  [98 F (36.7 C)-98.6 F (37 C)] 98.6 F (37 C) (04/23 0400) Pulse Rate:  [78-108] 102  (04/23 0400) Cardiac Rhythm:  [-] Normal sinus rhythm (04/23 0400) Resp:  [13-20] 17  (04/23 0744) BP: (107-126)/(63-78) 111/63 mmHg (04/23 0400) SpO2:  [95 %-99 %] 96 % (04/23 0400)  Hemodynamic parameters for last 24 hours:    Intake/Output from previous day: 04/22 0701 - 04/23 0700 In: 1420 [P.O.:960; I.V.:460] Out: 970 [Urine:600; Chest Tube:370] Intake/Output this shift:    General appearance: alert and no distress Neurologic: intact Heart: regular rate and rhythm Lungs: diminished breath sounds apex - right Wound: clean and dry Extreme to and fro motion in pleuravac but no definite air leak Lab Results:  Basename 07/10/11 0426 07/08/11 0400  WBC 8.8 6.1  HGB 13.2 11.9*  HCT 39.2 36.1  PLT 173 122*   BMET:  Basename 07/10/11 0426 07/08/11 0400  NA 136 135  K 3.6 3.8  CL 96 101  CO2 34* 28  GLUCOSE 140* 106*  BUN 12 12  CREATININE 0.75 0.76  CALCIUM 9.0 8.5    PT/INR: No results found for this basename: LABPROT,INR in the last 72 hours ABG    Component Value Date/Time   PHART 7.353 07/07/2011 0415   HCO3 25.5* 07/07/2011 0415   TCO2 27 07/07/2011 0415   O2SAT 94.0 07/07/2011 0415   CBG (last 3)  No results found for this basename: GLUCAP:3 in the last 72 hours  Assessment/Plan: S/P Procedure(s) (LRB): VIDEO ASSISTED THORACOSCOPY (VATS)/ LOBECTOMY (Right) Pathology still pending Will d/c anterior tube today Continue ambulation Lovenox and PAS for DVT prophylaxis   LOS: 4 days    Rhyland Hinderliter C 07/10/2011

## 2011-07-11 ENCOUNTER — Inpatient Hospital Stay (HOSPITAL_COMMUNITY): Payer: Medicare Other

## 2011-07-11 LAB — BLOOD GAS, ARTERIAL
Acid-Base Excess: 10.8 mmol/L — ABNORMAL HIGH (ref 0.0–2.0)
FIO2: 0.28 %
O2 Saturation: 38.8 %
Patient temperature: 98.6
TCO2: 36.9 mmol/L (ref 0–100)

## 2011-07-11 MED ORDER — FUROSEMIDE 10 MG/ML IJ SOLN
20.0000 mg | Freq: Once | INTRAMUSCULAR | Status: AC
  Administered 2011-07-11 (×2): 20 mg via INTRAVENOUS

## 2011-07-11 MED ORDER — METOPROLOL TARTRATE 1 MG/ML IV SOLN
5.0000 mg | Freq: Once | INTRAVENOUS | Status: AC
  Administered 2011-07-11: 5 mg via INTRAVENOUS

## 2011-07-11 MED ORDER — METOPROLOL TARTRATE 1 MG/ML IV SOLN
INTRAVENOUS | Status: AC
  Administered 2011-07-11: 5 mg via INTRAVENOUS
  Filled 2011-07-11: qty 5

## 2011-07-11 MED ORDER — METOPROLOL TARTRATE 1 MG/ML IV SOLN
INTRAVENOUS | Status: AC
  Filled 2011-07-11: qty 5

## 2011-07-11 MED ORDER — FUROSEMIDE 10 MG/ML IJ SOLN
INTRAMUSCULAR | Status: AC
  Administered 2011-07-11: 20 mg via INTRAVENOUS
  Filled 2011-07-11: qty 4

## 2011-07-11 MED ORDER — AMIODARONE HCL IN DEXTROSE 360-4.14 MG/200ML-% IV SOLN
30.0000 mg/h | INTRAVENOUS | Status: DC
  Administered 2011-07-12 – 2011-07-13 (×3): 30 mg/h via INTRAVENOUS
  Filled 2011-07-11 (×7): qty 200

## 2011-07-11 MED ORDER — METOPROLOL TARTRATE 12.5 MG HALF TABLET
12.5000 mg | ORAL_TABLET | Freq: Two times a day (BID) | ORAL | Status: DC
  Administered 2011-07-11 (×2): 12.5 mg via ORAL
  Filled 2011-07-11 (×4): qty 1

## 2011-07-11 MED ORDER — AMIODARONE HCL IN DEXTROSE 360-4.14 MG/200ML-% IV SOLN
60.0000 mg/h | INTRAVENOUS | Status: AC
  Administered 2011-07-11 – 2011-07-12 (×2): 60 mg/h via INTRAVENOUS
  Filled 2011-07-11 (×2): qty 200

## 2011-07-11 MED ORDER — AMIODARONE LOAD VIA INFUSION
150.0000 mg | Freq: Once | INTRAVENOUS | Status: AC
  Administered 2011-07-11: 150 mg via INTRAVENOUS
  Filled 2011-07-11: qty 83.34

## 2011-07-11 MED ORDER — WHITE PETROLATUM GEL
Status: AC
  Administered 2011-07-11: 21:00:00
  Filled 2011-07-11: qty 5

## 2011-07-11 NOTE — Progress Notes (Signed)
[name]            [place] [address]          [phone]     5 Days Post-Op  Procedure(s) (LRB): VIDEO ASSISTED THORACOSCOPY (VATS)/ LOBECTOMY (Right) Subjective: Feels ok, wants to go home  Objective  Telemetry SR, PAC's, PJC's  Temp:  [97.3 F (36.3 C)-98.5 F (36.9 C)] 97.3 F (36.3 C) (04/24 0500) Pulse Rate:  [80-101] 101  (04/24 0400) Resp:  [13-18] 17  (04/24 0400) BP: (94-129)/(54-77) 94/54 mmHg (04/24 0400) SpO2:  [2 %-98 %] 91 % (04/24 0400)   Intake/Output Summary (Last 24 hours) at 07/11/11 0753 Last data filed at 07/11/11 0600  Gross per 24 hour  Intake   1000 ml  Output    556 ml  Net    444 ml    Chest tube + air leak, + sq air   General appearance: alert, cooperative and no distress Heart: regular rate and rhythm and S1, S2 normal Lungs: diminished Right lower fields Abdomen: soft, nontender Extremities: no edema Wound: incisions healing well  Lab Results:  Basename 07/10/11 0426  NA 136  K 3.6  CL 96  CO2 34*  GLUCOSE 140*  BUN 12  CREATININE 0.75  CALCIUM 9.0  MG --  PHOS --   No results found for this basename: AST:2,ALT:2,ALKPHOS:2,BILITOT:2,PROT:2,ALBUMIN:2 in the last 72 hours No results found for this basename: LIPASE:2,AMYLASE:2 in the last 72 hours  Basename 07/10/11 0426  WBC 8.8  NEUTROABS --  HGB 13.2  HCT 39.2  MCV 89.1  PLT 173   No results found for this basename: CKTOTAL:4,CKMB:4,TROPONINI:4 in the last 72 hours No components found with this basename: POCBNP:3 No results found for this basename: DDIMER in the last 72 hours No results found for this basename: HGBA1C in the last 72 hours No results found for this basename: CHOL,HDL,LDLCALC,TRIG,CHOLHDL in the last 72 hours No results found for this basename: TSH,T4TOTAL,FREET3,T3FREE,THYROIDAB in the last 72 hours No results found for this basename: VITAMINB12,FOLATE,FERRITIN,TIBC,IRON,RETICCTPCT in the last 72  hours  Medications: Scheduled    . bisacodyl  10 mg Oral Daily  . enoxaparin (LOVENOX) injection  40 mg Subcutaneous Q24H  . famotidine  20 mg Oral Daily  . fentaNYL   Intravenous Q4H  . Fluticasone-Salmeterol  2 puff Inhalation Daily     Radiology/Studies:  Dg Chest Port 1 View  07/10/2011  *RADIOLOGY REPORT*  Clinical Data: Status post right upper lobectomy.  PORTABLE CHEST - 1 VIEW  Comparison: 07/09/2011  Findings: Postsurgical changes in the right lung.  Moderate right apical pneumothorax, increased.  Two indwelling right chest tubes. Small amount of subcutaneous emphysema along the right chest wall.  Left lung essentially clear.  The heart is normal in size.  Stable right IJ venous catheter.  IMPRESSION: Postsurgical changes in the right lung.  Moderate right apical pneumothorax, increased.  Two indwelling right chest tubes.  Original Report Authenticated By: Charline Bills, M.D.   Dg Chest Port 1 View  07/09/2011  *RADIOLOGY REPORT*  Clinical Data: Chest tube.  Pneumothorax.  PORTABLE CHEST - 1 VIEW  Comparison: 07/08/2011  Findings: Artifact overlies the chest.  Right-sided chest tube remains in place.  Small amount of pleural air at the apex has not changed.  Soft tissue air has not changed.  Left lung is clear. Right internal jugular central line has its  tip in the SVC at the azygos level.  IMPRESSION: No change in small amount of pleural air at the right apex.  Original Report Authenticated By: Thomasenia Sales, M.D.    INR: Will add last result for INR, ABG once components are confirmed Will add last 4 CBG results once components are confirmed  Assessment/Plan: S/P Procedure(s) (LRB): VIDEO ASSISTED THORACOSCOPY (VATS)/ LOBECTOMY (Right)  1. Doing well, will put back on suction to chest tube, with increased SQ air + pntx 2. Path- no carcinoma 3. Start low dose beta blocker with frequent pac's , bp too low to restart ACEI  yet  LOS: 5 days    Shawna [name] 4/24/20137:53  AM

## 2011-07-11 NOTE — Progress Notes (Signed)
UR Completed.  Shawna [name] Shawna [name] [phone] 07/11/2011  

## 2011-07-11 NOTE — Progress Notes (Signed)
At 1430 patient complained of SOB and pain in right side of chest/back that was not releived by PCA or 2 percocet. VS HR 100, 97/55,21,93% on 2L, Called and spoke to [facility] who came up to see pt, a CXR was ordered and lasix 20 mg was given, pt also had a breathing tx with RT. Around 1445, pt VS HR 106, 98% on 3L, 119/68, pain still a 4 but SOB has calmed down, . By 1500 pt resting much more comfortably, will continue to monitor.

## 2011-07-12 ENCOUNTER — Inpatient Hospital Stay (HOSPITAL_COMMUNITY): Payer: Medicare Other

## 2011-07-12 ENCOUNTER — Other Ambulatory Visit: Payer: Self-pay | Admitting: Diagnostic Radiology

## 2011-07-12 MED ORDER — AMOXICILLIN-POT CLAVULANATE 875-125 MG PO TABS
1.0000 | ORAL_TABLET | Freq: Two times a day (BID) | ORAL | Status: DC
  Administered 2011-07-12 – 2011-07-15 (×7): 1 via ORAL
  Filled 2011-07-12 (×8): qty 1

## 2011-07-12 MED ORDER — GUAIFENESIN ER 600 MG PO TB12
1200.0000 mg | ORAL_TABLET | Freq: Two times a day (BID) | ORAL | Status: DC
  Administered 2011-07-12 – 2011-07-15 (×7): 1200 mg via ORAL
  Filled 2011-07-12 (×8): qty 2

## 2011-07-12 MED ORDER — BENZONATATE 100 MG PO CAPS
200.0000 mg | ORAL_CAPSULE | Freq: Three times a day (TID) | ORAL | Status: AC
  Administered 2011-07-12 – 2011-07-13 (×6): 200 mg via ORAL
  Filled 2011-07-12 (×6): qty 2

## 2011-07-12 MED ORDER — LEVALBUTEROL TARTRATE 45 MCG/ACT IN AERO
2.0000 | INHALATION_SPRAY | Freq: Three times a day (TID) | RESPIRATORY_TRACT | Status: DC
  Administered 2011-07-12 – 2011-07-14 (×4): 2 via RESPIRATORY_TRACT
  Filled 2011-07-12: qty 15

## 2011-07-12 MED ORDER — METOPROLOL TARTRATE 25 MG PO TABS
25.0000 mg | ORAL_TABLET | Freq: Two times a day (BID) | ORAL | Status: DC
  Administered 2011-07-12 – 2011-07-13 (×4): 25 mg via ORAL
  Filled 2011-07-12 (×6): qty 1

## 2011-07-12 MED ORDER — SODIUM CHLORIDE 0.9 % IJ SOLN
INTRAMUSCULAR | Status: AC
  Administered 2011-07-12: 10 mL
  Filled 2011-07-12: qty 10

## 2011-07-12 NOTE — Progress Notes (Signed)
Care coordinator asked me to take another look at patient, upon assessment patient is alert, agitated and confused s/p VATS, increased HR, shortness of breath, complaining of pain from CT insertion site. Decreased lung sounds on the right with subcutaneous air noted up to patient's clavicle. MD aware, orders received for CXR, ABG, EKG, and lopressor. Minimal results from lopressor, amiodarone drip ordered, HR better controlled. Patient's daughters at bedside, patient less agitated, advised beside RN to call with any further issues, will continue to monitor

## 2011-07-12 NOTE — Progress Notes (Signed)
Patient ID: Shawna [name], female   DOB: [birth year], 66 y.o.   MRN: [id number] Feels a little better now than earlier today In SR at 55 sats ok F/u CXR on water seal shows better aeration on right, ? Small apical pneumothorax No air leak with repeated coughing Will d/c chest tube

## 2011-07-12 NOTE — Progress Notes (Signed)
Pt states she plans to d/c home with her daughter at time of d/c. Pt eager to return to living independently, but understands she will need some time to recover from her surgery. Pt agreeable to home health services, as appropriate. CSW will refer to [facility]. No social work needs identified.  Baxter Flattery, MSW [phone]

## 2011-07-12 NOTE — Progress Notes (Signed)
Wasted 18cc from pca fentanyl syringe.  Witnessed by Osvaldo Human RN.  Roselie Awkward, RN

## 2011-07-12 NOTE — Progress Notes (Addendum)
[address]            [place] [address]          [phone]     6 Days Post-Op  Procedure(s) (LRB): VIDEO ASSISTED THORACOSCOPY (VATS)/ LOBECTOMY (Right) Subjective: Feels poorly, less SOB but some   Objective  Telemetry SR , had some afib, pac's, pjc's  Temp:  [98.1 F (36.7 C)-99.5 F (37.5 C)] 98.5 F (36.9 C) (04/25 0300) Pulse Rate:  [53-160] 90  (04/25 0600) Resp:  [15-28] 15  (04/25 0741) BP: (83-126)/(48-96) 102/58 mmHg (04/25 0600) SpO2:  [89 %-100 %] 97 % (04/25 0757)   Intake/Output Summary (Last 24 hours) at 07/12/11 0848 Last data filed at 07/12/11 0700  Gross per 24 hour  Intake    940 ml  Output    485 ml  Net    455 ml       General appearance: alert, fatigued and no distress Heart: regular rate and rhythm and S1, S2 normal Lungs: diminished 1ir movement Right lower fields Abdomen: soft, nontender, + BS Extremities: no edema Wound: incisions healing well  Lab Results:  Basename 07/10/11 0426  NA 136  K 3.6  CL 96  CO2 34*  GLUCOSE 140*  BUN 12  CREATININE 0.75  CALCIUM 9.0  MG --  PHOS --   No results found for this basename: AST:2,ALT:2,ALKPHOS:2,BILITOT:2,PROT:2,ALBUMIN:2 in the last 72 hours No results found for this basename: LIPASE:2,AMYLASE:2 in the last 72 hours  Basename 07/10/11 0426  WBC 8.8  NEUTROABS --  HGB 13.2  HCT 39.2  MCV 89.1  PLT 173   No results found for this basename: CKTOTAL:4,CKMB:4,TROPONINI:4 in the last 72 hours No components found with this basename: POCBNP:3 No results found for this basename: DDIMER in the last 72 hours No results found for this basename: HGBA1C in the last 72 hours No results found for this basename: CHOL,HDL,LDLCALC,TRIG,CHOLHDL in the last 72 hours No results found for this basename: TSH,T4TOTAL,FREET3,T3FREE,THYROIDAB in the last 72 hours No results found for this basename: VITAMINB12,FOLATE,FERRITIN,TIBC,IRON,RETICCTPCT in the last 72  hours  Medications: Scheduled    . amiodarone  150 mg Intravenous Once  . bisacodyl  10 mg Oral Daily  . enoxaparin (LOVENOX) injection  40 mg Subcutaneous Q24H  . famotidine  20 mg Oral Daily  . fentaNYL   Intravenous Q4H  . Fluticasone-Salmeterol  2 puff Inhalation Daily  . furosemide  20 mg Intravenous Once  . metoprolol      . metoprolol  5 mg Intravenous Once  . metoprolol  5 mg Intravenous Once  . metoprolol tartrate  12.5 mg Oral BID  . white petrolatum         Radiology/Studies:  Dg Chest Port 1 View  07/12/2011  *RADIOLOGY REPORT*  Clinical Data: Status post VATS on the right.  PORTABLE CHEST - 1 VIEW  Comparison: Chest 07/11/2011.  Findings: Right IJ catheter and right chest tube remain in place. There has been increase in right effusion and basilar airspace disease.  Subcutaneous air is seen along the right chest wall.  No pneumothorax is identified.  Volume loss in the right chest and surgical clips again noted.  Left lung is clear.  IMPRESSION: Increased right effusion and airspace disease.  No pneumothorax identified with the chest tube in place.  Original Report Authenticated By: Shawna Bell. [name], M.D.   Dg Chest [place]  1 View  07/11/2011  *RADIOLOGY REPORT*  Clinical Data: Severe shortness of breath.  PORTABLE CHEST - 1 VIEW  Comparison: 07/11/2011  Findings: Single view of the chest again demonstrates a stable right-sided chest tube.  No evidence for a large pneumothorax. There is subcutaneous gas throughout the right hemithorax.  Stable volume loss in the right hemithorax.  The left lung remains clear. Heart size appears stable.  Jugular central line in the upper SVC region.  IMPRESSION: Stable postoperative changes with volume loss in the right hemithorax.  No significant change from the prior examination.  Right chest tube without a large pneumothorax.  Original Report Authenticated By: Shawna [name], M.D.   Dg Chest Port 1 View  07/11/2011  *RADIOLOGY REPORT*  Clinical  Data: Follow-up chest tube  PORTABLE CHEST - 1 VIEW  Comparison: 07/10/2011  Findings: Right-sided chest tube has been removed.  Additional tube overlies the right chest.  Right internal jugular central line has its tip in the SVC at the azygos level.  Pleural air persists on the right to, not grossly increased since yesterday.  Chest wall air remains the same.  Left lung is clear.  Residual right lung shows some diffuse density, unchanged.  IMPRESSION: Right chest tube removed.  Additional tubing overlies the right chest.  Persistent moderate sized right pneumothorax, not significantly changed since yesterday.  Original Report Authenticated By: Shawna [name], M.D.   Dg Chest Port 1v Same Day  07/11/2011  *RADIOLOGY REPORT*  Clinical Data: Increase in pain and shortness of breath  PORTABLE CHEST - 1 VIEW SAME DAY  Comparison: Same day and multiple previous  Findings: There is much less pleural air evident on the right. There is only a small amount of pleural air at the right apex.  One right chest tube remains in place.  Right internal jugular central line is unchanged.  Left lung remains well aerated.  Density in the remaining right lung appears the same.  IMPRESSION: Much less pleural air than was seen earlier today.  Only a small amount at the apex.  No worsening or complicating feature.  Original Report Authenticated By: Shawna [name], M.D.    INR: Will add last result for INR, ABG once components are confirmed Will add last 4 CBG results once components are confirmed  Assessment/Plan: S/P Procedure(s) (LRB): VIDEO ASSISTED THORACOSCOPY (VATS)/ LOBECTOMY (Right)  1. Amiodarone load for post op afib 2. pulm toilet, nebs, wean O2 as able, rehab 3 chest tube no air leak, will place to H2O seal  LOS: 6 days    [name],[name] 4/25/20138:48 AM    Patient seen and examined. ECG done last night reads ST but looks like atrial fib to me. She's now in SR on amiodarone gtt. Having a lot of pain at  CT site- not using PCA much- will d/c PCA and use PO pain meds CXR shows no pneumothorax, but there does appear to be some airspace disease on the right- will start PO Augmentin Possibly d/c CT later today

## 2011-07-13 ENCOUNTER — Inpatient Hospital Stay (HOSPITAL_COMMUNITY): Payer: Medicare Other

## 2011-07-13 LAB — URINE MICROSCOPIC-ADD ON

## 2011-07-13 LAB — URINALYSIS, ROUTINE W REFLEX MICROSCOPIC
Nitrite: NEGATIVE
Protein, ur: 30 mg/dL — AB
Specific Gravity, Urine: 1.023 (ref 1.005–1.030)
Urobilinogen, UA: 1 mg/dL (ref 0.0–1.0)

## 2011-07-13 LAB — CBC
HCT: 33.8 % — ABNORMAL LOW (ref 36.0–46.0)
MCH: 29.9 pg (ref 26.0–34.0)
MCHC: 34 g/dL (ref 30.0–36.0)
MCV: 87.8 fL (ref 78.0–100.0)
Platelets: 163 10*3/uL (ref 150–400)
RDW: 13.7 % (ref 11.5–15.5)

## 2011-07-13 LAB — BASIC METABOLIC PANEL
BUN: 15 mg/dL (ref 6–23)
Calcium: 8.8 mg/dL (ref 8.4–10.5)
Chloride: 90 mEq/L — ABNORMAL LOW (ref 96–112)
Creatinine, Ser: 0.74 mg/dL (ref 0.50–1.10)
GFR calc Af Amer: >90 mL/min (ref >90)

## 2011-07-13 MED ORDER — PROMETHAZINE HCL 25 MG/ML IJ SOLN: 12.5000 mg | Freq: Four times a day (QID) | INTRAMUSCULAR | Status: DC | PRN

## 2011-07-13 MED ORDER — AMIODARONE HCL 200 MG PO TABS
400.0000 mg | ORAL_TABLET | Freq: Two times a day (BID) | ORAL | Status: DC
  Administered 2011-07-13 – 2011-07-15 (×5): 400 mg via ORAL
  Filled 2011-07-13 (×6): qty 2

## 2011-07-13 MED ORDER — SODIUM CHLORIDE 0.9 % IJ SOLN
INTRAMUSCULAR | Status: AC
  Administered 2011-07-13: 10 mL
  Filled 2011-07-13: qty 20

## 2011-07-13 MED ORDER — ACETAMINOPHEN 500 MG PO TABS
1000.0000 mg | ORAL_TABLET | Freq: Four times a day (QID) | ORAL | Status: DC | PRN
  Filled 2011-07-13: qty 2

## 2011-07-13 MED ORDER — ONDANSETRON HCL 4 MG/2ML IJ SOLN: 4.0000 mg | Freq: Four times a day (QID) | INTRAMUSCULAR | Status: DC | PRN

## 2011-07-13 MED ORDER — POTASSIUM CHLORIDE CRYS ER 20 MEQ PO TBCR
40.0000 meq | EXTENDED_RELEASE_TABLET | Freq: Two times a day (BID) | ORAL | Status: DC
  Administered 2011-07-13 (×2): 40 meq via ORAL
  Filled 2011-07-13 (×4): qty 2

## 2011-07-13 MED ORDER — OXYCODONE HCL 5 MG PO TABS: 5.0000 mg | ORAL_TABLET | ORAL | Status: DC | PRN

## 2011-07-13 MED ORDER — FLEET ENEMA 7-19 GM/118ML RE ENEM: 1.0000 | ENEMA | Freq: Every day | RECTAL | Status: DC | PRN

## 2011-07-13 MED ORDER — SODIUM CHLORIDE 0.9 % IJ SOLN
INTRAMUSCULAR | Status: AC
  Administered 2011-07-13: 04:00:00
  Filled 2011-07-13: qty 10

## 2011-07-13 MED ORDER — POLYETHYLENE GLYCOL 3350 17 G PO PACK
17.0000 g | PACK | Freq: Once | ORAL | Status: AC
  Administered 2011-07-13: 17 g via ORAL
  Filled 2011-07-13: qty 1

## 2011-07-13 NOTE — Progress Notes (Signed)
   CARE MANAGEMENT NOTE 07/13/2011  Patient:  SINIA, ANTOSH   Account Number:  [id number]  Date Initiated:  07/11/2011  Documentation initiated by:  [facility]  Subjective/Objective Assessment:   R Vats - lobectomy.  Lives alone.     Action/Plan:   PTA, PT INDEPENDENT, LIVES ALONE.  SHE PLANS TO DC HOME WITH DAUGHTER AT DC.  WILL FOLLOW FOR HOME NEEDS.   Anticipated DC Date:  07/16/2011   Anticipated DC Plan:  HOME W HOME HEALTH SERVICES      DC Planning Services  CM consult      Choice offered to / List presented to:             Status of service:  In process, will continue to follow Medicare Important Message given?   (If response is "NO", the following Medic re IM given date fields will be blank) Date Medicare IM given:   Date Additional Medicare IM given:    Discharge Disposition:    Per UR Regulation:  Reviewed for med. necessity/level of care/duration of stay  If discussed at Long Length of Stay Meetings, dates discussed:    Comments:    Jerrell Belfast, RN, BSN Phone #[phone]

## 2011-07-13 NOTE — Progress Notes (Signed)
7 Days Post-Op Procedure(s) (LRB): VIDEO ASSISTED THORACOSCOPY (VATS)/ LOBECTOMY (Right) Subjective: "i just feel lousy" C/o pain- has not asked for pain med Mild nausea  Objective: Vital signs in last 24 hours: Temp:  [97.8 F (36.6 C)-99 F (37.2 C)] 98.4 F (36.9 C) (04/26 0352) Pulse Rate:  [80-102] 87  (04/26 0352) Cardiac Rhythm:  [-] Normal sinus rhythm (04/26 0352) Resp:  [16-22] 20  (04/26 0352) BP: (95-119)/(53-86) 113/58 mmHg (04/26 0345) SpO2:  [89 %-99 %] 96 % (04/26 0352)  Hemodynamic parameters for last 24 hours:    Intake/Output from previous day: 04/25 0701 - 04/26 0700 In: 1564.1 [P.O.:720; I.V.:844.1] Out: 525 [Urine:475; Chest Tube:50] Intake/Output this shift:    General appearance: alert and distracted Heart: regular rate and rhythm Lungs: clear to auscultation bilaterally Abdomen: normal findings: soft, non-tender Wound: healing well  Lab Results:  Basename 07/13/11 0345  WBC 13.5*  HGB 11.5*  HCT 33.8*  PLT 163   BMET:  Basename 07/13/11 0345  NA 132*  K 3.2*  CL 90*  CO2 34*  GLUCOSE 108*  BUN 15  CREATININE 0.74  CALCIUM 8.8    PT/INR: No results found for this basename: LABPROT,INR in the last 72 hours ABG    Component Value Date/Time   PHART 7.447* 07/11/2011 1950   HCO3 35.3* 07/11/2011 1950   TCO2 36.9 07/11/2011 1950   O2SAT 38.8 07/11/2011 1950   CBG (last 3)  No results found for this basename: GLUCAP:3 in the last 72 hours  Assessment/Plan: S/P Procedure(s) (LRB): VIDEO ASSISTED THORACOSCOPY (VATS)/ LOBECTOMY (Right) - CV- in SR on amiodarone gtt, convert to PO amiodarone RESP- continue advair, ppulmonary toilet PAIN- encouraged to ask for pain med HYPOKALEMIA- supplement K Leukocytosis- likely pulmonary- started on augmentin yesterday for empiric treatment of aspiration  Will check UA Ambulate Nausea- Zofran, phenergan PRN   LOS: 7 days    Julizza Sassone C 07/13/2011

## 2011-07-13 NOTE — Clinical Documentation Improvement (Signed)
PNEUMONIA DOCUMENTATION CLARIFICATION QUERY  THIS DOCUMENT IS NOT A PERMANENT PART OF THE MEDICAL RECORD  TO RESPOND TO THE THIS QUERY, FOLLOW THE INSTRUCTIONS BELOW:  1. If needed, update documentation for the patient's encounter via the notes activity.  2. Access this query again and click edit on the [employer].  3. After updating, or not, click F2 to complete all highlighted (required) fields concerning your review. Select "additional documentation in the medical record" OR "no additional documentation provided".  4. Click Sign note button.  5. The deficiency will fall out of your InBasket *Please let us know if you are not able to complete this workflow by phone or e-mail (listed below).  Please update your documentation within the medical record to reflect your response to this query.                                                                                    07/13/11  Dear Dr.Saverio Kader / Associates  In a better effort to capture your patient's severity of illness, reflect appropriate length of stay and utilization of resources, a review of the patient medical record has revealed the following indicators.    Based on your clinical judgment, please clarify and document in a progress note and/or discharge summary the clinical condition associated with the following supporting information:  In responding to this query please exercise your independent judgment.  The fact that a query is asked, does not imply that any particular answer is desired or expected.  Possible Clinical Conditions?   _Aspiration Pneumonia (POA?)  _Bacterial pneumonia, specify type if known (POA?)  _Viral PNA (POA?)  _Pneumonia (CAP, HAP) (POA?)  _Other Condition  _Cannot Clinically Determine    Supporting Information: Leukocytosis-likely pulmonary-started on Augmentin on 4/25 for empiric treatment of Aspiration.  You may use possible, probable, or suspect with inpatient documentation.  possible, probable, suspected diagnoses MUST be documented at the time of discharge  Reviewed: additional documentation in the medical record  Thank You,  Marciano Sequin,  Clinical Documentation Specialist:  Pager: [phone]  Health Information Management [place]

## 2011-07-14 LAB — BASIC METABOLIC PANEL
BUN: 16 mg/dL (ref 6–23)
Calcium: 8.9 mg/dL (ref 8.4–10.5)
GFR calc Af Amer: 86 mL/min — ABNORMAL LOW (ref >90)
GFR calc non Af Amer: 75 mL/min — ABNORMAL LOW (ref >90)
Potassium: 4.3 mEq/L (ref 3.5–5.1)
Sodium: 134 mEq/L — ABNORMAL LOW (ref 135–145)

## 2011-07-14 LAB — URINE CULTURE: Culture: NO GROWTH

## 2011-07-14 MED ORDER — ENSURE PO LIQD: 237.0000 mL | Freq: Three times a day (TID) | ORAL | Status: DC

## 2011-07-14 MED ORDER — LEVALBUTEROL TARTRATE 45 MCG/ACT IN AERO
2.0000 | INHALATION_SPRAY | Freq: Three times a day (TID) | RESPIRATORY_TRACT | Status: DC
  Administered 2011-07-14: 2 via RESPIRATORY_TRACT
  Filled 2011-07-14: qty 15

## 2011-07-14 MED ORDER — METOPROLOL SUCCINATE ER 25 MG PO TB24
25.0000 mg | ORAL_TABLET | Freq: Every day | ORAL | Status: DC
  Administered 2011-07-15: 25 mg via ORAL
  Filled 2011-07-14 (×2): qty 1

## 2011-07-14 MED ORDER — SODIUM CHLORIDE 0.9 % IJ SOLN
INTRAMUSCULAR | Status: AC
  Administered 2011-07-14: 04:00:00
  Filled 2011-07-14: qty 10

## 2011-07-14 MED ORDER — MELOXICAM 15 MG PO TABS
15.0000 mg | ORAL_TABLET | Freq: Every day | ORAL | Status: DC
  Administered 2011-07-14 – 2011-07-15 (×2): 15 mg via ORAL
  Filled 2011-07-14 (×2): qty 1

## 2011-07-14 MED ORDER — ALPRAZOLAM 0.25 MG PO TABS
0.2500 mg | ORAL_TABLET | Freq: Three times a day (TID) | ORAL | Status: DC | PRN
  Administered 2011-07-14: 0.25 mg via ORAL
  Filled 2011-07-14: qty 1

## 2011-07-14 MED ORDER — ENSURE COMPLETE PO LIQD
237.0000 mL | Freq: Three times a day (TID) | ORAL | Status: DC
  Administered 2011-07-14 – 2011-07-15 (×4): 237 mL via ORAL
  Filled 2011-07-14: qty 237

## 2011-07-14 NOTE — Progress Notes (Addendum)
8 Days Post-Op Procedure(s) (LRB): VIDEO ASSISTED THORACOSCOPY (VATS)/ LOBECTOMY (Right) Subjective: Some pain right side  Objective: Vital signs in last 24 hours: Temp:  [97.9 F (36.6 C)-98.9 F (37.2 C)] 97.9 F (36.6 C) (04/27 0710) Pulse Rate:  [62-100] 62  (04/27 0710) Cardiac Rhythm:  [-] Normal sinus rhythm (04/27 0710) Resp:  [14-19] 15  (04/27 0710) BP: (93-112)/(53-68) 93/57 mmHg (04/27 0710) SpO2:  [81 %-100 %] 97 % (04/27 0907)  Hemodynamic parameters for last 24 hours:    Intake/Output from previous day: 04/26 0701 - 04/27 0700 In: 473.5 [P.O.:120; I.V.:203.5; IV Piggyback:150] Out: -  Intake/Output this shift: Total I/O In: 240 [P.O.:240] Out: -   General appearance: alert and no distress Heart: regular rate and rhythm Lungs: clear to auscultation bilaterally Wound: purulent drainage from posterior CT site  Lab Results:  Basename 07/13/11 0345  WBC 13.5*  HGB 11.5*  HCT 33.8*  PLT 163   BMET:  Basename 07/14/11 0440 07/13/11 0345  NA 134* 132*  K 4.3 3.2*  CL 96 90*  CO2 34* 34*  GLUCOSE 107* 108*  BUN 16 15  CREATININE 0.81 0.74  CALCIUM 8.9 8.8    PT/INR: No results found for this basename: LABPROT,INR in the last 72 hours ABG    Component Value Date/Time   PHART 7.447* 07/11/2011 1950   HCO3 35.3* 07/11/2011 1950   TCO2 36.9 07/11/2011 1950   O2SAT 38.8 07/11/2011 1950   CBG (last 3)  No results found for this basename: GLUCAP:3 in the last 72 hours  Assessment/Plan: S/P Procedure(s) (LRB): VIDEO ASSISTED THORACOSCOPY (VATS)/ LOBECTOMY (Right) - Slowly progressing but looks better today Down to 1 L [place] Maintaining SR on lopressor and amiodarone, but BP relatively low- change lopressor to Toprol 25 mg daily CT site infection- wound opened, will pack with NS wet to dry BID, continue augmentin   LOS: 8 days    Freyja Govea C 07/14/2011   Wound infection- chest tube site Aspiration pneumonia suspected

## 2011-07-15 ENCOUNTER — Inpatient Hospital Stay (HOSPITAL_COMMUNITY): Payer: Medicare Other

## 2011-07-15 MED ORDER — AMIODARONE HCL 400 MG PO TABS: 400.0000 mg | ORAL_TABLET | Freq: Two times a day (BID) | ORAL | Status: DC

## 2011-07-15 MED ORDER — LEVALBUTEROL TARTRATE 45 MCG/ACT IN AERO
2.0000 | INHALATION_SPRAY | Freq: Two times a day (BID) | RESPIRATORY_TRACT | Status: DC
  Administered 2011-07-15: 2 via RESPIRATORY_TRACT

## 2011-07-15 MED ORDER — TRAMADOL HCL 50 MG PO TABS: 50.0000 mg | ORAL_TABLET | Freq: Four times a day (QID) | ORAL | Status: AC | PRN

## 2011-07-15 MED ORDER — AMOXICILLIN-POT CLAVULANATE 875-125 MG PO TABS: 1.0000 | ORAL_TABLET | Freq: Two times a day (BID) | ORAL | Status: DC

## 2011-07-15 MED ORDER — GUAIFENESIN ER 600 MG PO TB12: 1200.0000 mg | ORAL_TABLET | Freq: Two times a day (BID) | ORAL | Status: DC

## 2011-07-15 MED ORDER — METOPROLOL SUCCINATE ER 25 MG PO TB24: 25.0000 mg | ORAL_TABLET | Freq: Every day | ORAL | Status: DC

## 2011-07-15 NOTE — Progress Notes (Signed)
Patient ID: Shawna [name], female   DOB: April 04, 1945, 66 y.o.   MRN: [id number] Still wants to go home Explained the risks and possible need to return if her HR increases or she goes into atrial fibrillation She is in SR, PACs still present but decreased in frequency I am going to let her go home as long as she agrees to call if she has any tachycardia or palpitations She's aware of the risks

## 2011-07-15 NOTE — Progress Notes (Signed)
   CARE MANAGEMENT NOTE 07/15/2011  Patient:  Shawna [name], Shawna [name]   Account Number:  [id number]  Date Initiated:  07/11/2011  Documentation initiated by:  [facility]  Subjective/Objective Assessment:   R Vats - lobectomy.  Lives alone.     Action/Plan:   PTA, PT INDEPENDENT, LIVES ALONE.  SHE PLANS TO DC HOME WITH DAUGHTER AT DC.  WILL FOLLOW FOR HOME NEEDS.   Anticipated DC Date:  07/16/2011   Anticipated DC Plan:  HOME W HOME HEALTH SERVICES      DC Planning Services  CM consult      [facility]  HOME HEALTH   Choice offered to / List presented to:  C-1 Patient        HH arranged  HH-1 RN      [facility] agency  [facility]   Status of service:  Completed, signed off Medicare Important Message given?   (If response is "NO", the following Medicare IM given date fields will be blank) Date Medicare IM given:   Date Additional Medicare IM given:    Discharge Disposition:    Per UR Regulation:  Reviewed for med. necessity/level of care/duration of stay  If discussed at Long Length of Stay Meetings, dates discussed:    Comments:  07/15/2011 1400 Pt requested [facility] for [facility]. Contacted [facility] for scheduled d/c today. Unit RN will change pt's dressing prior to d/c. Isidoro Donning RN CCM Case Mgmt phone [phone]

## 2011-07-15 NOTE — Progress Notes (Signed)
Discharge instructions given to pt and her daughter--both verbalized understanding of follow-up appts, home meds, activity levels, importance of smoking cessation, HHRN and wound care, s/s of complications and when to call MD/EMS. Pt stable. Renette Butters, Viona Gilmore

## 2011-07-15 NOTE — Progress Notes (Signed)
9 Days Post-Op Procedure(s) (LRB): VIDEO ASSISTED THORACOSCOPY (VATS)/ LOBECTOMY (Right) Subjective: Anxious to go home No complaints except some pain at ct site  Objective: Vital signs in last 24 hours: Temp:  [97.6 F (36.4 C)-98.2 F (36.8 C)] 98.2 F (36.8 C) (04/28 0710) Pulse Rate:  [72-105] 105  (04/28 0850) Cardiac Rhythm:  [-] Normal sinus rhythm (04/28 0850) Resp:  [14-25] 14  (04/28 0710) BP: (98-121)/(55-67) 121/67 mmHg (04/28 0710) SpO2:  [87 %-96 %] 94 % (04/28 0909)  Hemodynamic parameters for last 24 hours:    Intake/Output from previous day: 04/27 0701 - 04/28 0700 In: 600 [P.O.:600] Out: -  Intake/Output this shift: Total I/O In: 240 [P.O.:240] Out: -   General appearance: alert and no distress Neurologic: intact Heart: irregularly irregular rhythm Lungs: wheezes faint Wound: ct incision packed with mild erythema  Lab Results:  Basename 07/13/11 0345  WBC 13.5*  HGB 11.5*  HCT 33.8*  PLT 163   BMET:  Basename 07/14/11 0440 07/13/11 0345  NA 134* 132*  K 4.3 3.2*  CL 96 90*  CO2 34* 34*  GLUCOSE 107* 108*  BUN 16 15  CREATININE 0.81 0.74  CALCIUM 8.9 8.8    PT/INR: No results found for this basename: LABPROT,INR in the last 72 hours ABG    Component Value Date/Time   PHART 7.447* 07/11/2011 1950   HCO3 35.3* 07/11/2011 1950   TCO2 36.9 07/11/2011 1950   O2SAT 38.8 07/11/2011 1950   CBG (last 3)  No results found for this basename: GLUCAP:3 in the last 72 hours  Assessment/Plan: S/P Procedure(s) (LRB): VIDEO ASSISTED THORACOSCOPY (VATS)/ LOBECTOMY (Right) - Looks better today Only issue holding up d/c is her rhythm. She's in SR but with frequent PACs and brief(3-4 beats) runs of a fib. She just got toprol a few minutes ago, didn't get it yesterday Will observe for another couple of hours, if rhythm stabilizes on toprol will d/c later today sats OK off O2   LOS: 9 days    Maie Kesinger C 07/15/2011

## 2011-07-15 NOTE — Discharge Instructions (Signed)
Lung Resection Care After Refer to this sheet in the next few weeks. These instructions provide you with information on caring for yourself after your procedure. Your caregiver may also give you more specific instructions. Your treatment has been planned according to current medical practices, but problems sometimes occur. Call your caregiver if you have any problems or questions after your procedure. HOME CARE INSTRUCTIONS  You may resume a normal diet and activities as directed.   Do not smoke or use tobacco products.   Change your bandages (dressings) as directed.   Only take over-the-counter or prescription medicines for pain, discomfort, or fever as directed by your caregiver.   Keep all follow-up appointments as directed.   Try to breathe deeply and cough as directed. Holding a pillow firmly over your ribs may help with discomfort.   If you were given an incentive spirometer in the hospital, continue to use it as directed.   Walk as directed by your caregiver.   You may take a shower and gently wash the area of your surgical cut (incision) with water and soap as directed. Do not use anything else to clean your incision except as directed by your caregiver. Do not take baths or sit in a hot tub.  SEEK MEDICAL CARE IF:  You notice redness, swelling, or increasing pain in the incision.   You are bleeding from the incision.   You see pus coming from the incision.   You notice a bad smell coming from the incision or dressing.   Your incision breaks open.   You cough up blood or pus, or you develop a cough that produces bad smelling sputum.   You have pain or swelling in your legs.   You have increasing pain that is not controlled with medicine.   You have trouble managing any of the tubes that have been left in place after surgery.  SEEK IMMEDIATE MEDICAL CARE IF:   You have a fever or chills.   You have any reaction or side effects to medicines given.   You have chest  pain or an irregular or rapid heartbeat.   You have dizzy episodes or fainting.   You have shortness of breath or difficulty breathing.   You have persistent nausea or vomiting.   You have a rash.  MAKE SURE YOU:  Understand these instructions.   Will watch your condition.   Will get help right away if you are not doing well or get worse.  Document Released: 09/22/2004 Document Revised: 02/22/2011 Document Reviewed: 11/02/2010 [employer] Patient Information 2012 [employer] 

## 2011-07-15 NOTE — Discharge Summary (Signed)
[address]            [name] [address]          [phone]      Shawna [name] 27-Apr-1945 66 y.o. [id number]  07/06/2011   Shawna Slot, MD  RUL MASS  .  Lung Lesion     Referral from Dr Charm Barges for eval on RUL nodule, Chest CT 05/10/2011   HPI: Shawna [name] is a 66 year old woman who presents with chief complaint of a lung nodule.  Shawna [name] is a 66 year old woman with a history of tobacco abuse and COPD, she complains of a cough, chest tightness, and shortness of breath dating back about a month. She's had bronchitis before these symptoms were similar so she didn't immediately seek medical attention. She says it started with nasal and sinus congestion and then moved down to her chest. She does note that her chest tightness and shortness of breath with exertion have been worse recently particularly when she walks up stairs. She saw Shawna [name] who recommended a chest x-ray, she initially did not want to do the x-ray but a week later when her symptoms were persistent she went ahead and had the x-ray done and it showed a new right upper lobe shadow. A CT of the chest was done which showed a spiculated 1.4 x 1.2 x 1.0 cm right upper lobe mass. There is no hilar or mediastinal adenopathy. There was evidence of emphysema. There also was noted significant calcification in the aorta and coronary arteries. She was seen in thoracic surgical consultation by Shawna Lango M.D. who evaluated the patient and her studies. At the time of consultation he recommended further studies including pulmonary function test, PET/CT scan, and cardiology evaluation.   Results and impression of this evaluation are as follows:   Shawna [name] is a 66 year old woman with a history of tobacco abuse who was seen in the office in February regarding a new right upper lobe nodule. This was a 1.4 x 1.2 x 1.0 cm spiculated nodule located centrally in the right upper lobe.  I  recommended that Shawna [name] we proceed with evaluation for possible surgical resection with a PET/CT, PFT and cardiology evaluation.  She was seen by [name] and had a pharmacologic nuclear scan which was a low risk study.  PET/CT showed the right upper lobe nodule to be hypermetabolic with an SUV of 2.9, there is no evidence of regional or distant metastases  Pulmonary function testing showed moderate COPD and FEV1 of 1.68 (64% of predicted) which improved to 1.86 with bronchodilators. DLCO is 50%  I discussed the indications of these results with Shawna [name]. 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 She does understand there is a possibility this is not a lung cancer, but that is unlikely and can only be definitively proven with excisional biopsy. Bronchoscopic or percutaneous biopsy could have false-negative results and could not definitively rule out this as a cancer. Therefore I recommended to her that we proceed with a right VATS and right upper lobectomy with lymph node dissection for definitive diagnosis and treatment. This lesion is very centrally located and is not amenable to wedge resection prior to lobectomy.  I discussed with her the general nature of the procedure, incision to be used, need for general  anesthesia, expected outcomes. She understands that even with surgical resection if there is a risk of recurrence lung cancer and she would need to continue to be followed. She understands the risk of surgery include but are not limited to death, stroke, MI, DVT, PE, bleeding, possible need for transfusions, infections, prolonged air leak, as well as other unforeseeable complications. She is very anxious about the possibility of having surgery but did agree to proceed. She was admitted this hospitalization for the resection.  Family history  Significant for her mother having cardiovascular disease    Social History  History   Substance Use Topics   .  Smoking status:  Current Everyday Smoker -- 20.0 packs/day     Types:  Cigarettes   .  Smokeless tobacco:  Not on file   .  Alcohol Use:  Not on file    Current Outpatient Prescriptions   Medication  Sig  Dispense  Refill   .  albuterol (PROVENTIL HFA;VENTOLIN HFA) 108 (90 BASE) MCG/ACT inhaler  Inhale 2 puffs into the lungs every 6 (six) hours as needed.     .  Fluticasone-Salmeterol (ADVAIR) 500-50 MCG/DOSE AEPB  Inhale 1 puff into the lungs every 12 (twelve) hours.     Marland Kitchen  ipratropium-albuterol (DUONEB) 0.5-2.5 (3) MG/3ML SOLN  Take 3 mLs by nebulization every 4 (four) hours as needed.     Marland Kitchen  KLOR-CON M20 20 MEQ tablet  Take 20 mEq by mouth daily.     Marland Kitchen  lisinopril-hydrochlorothiazide (PRINZIDE,ZESTORETIC) 10-12.5 MG per tablet  Take 1 tablet by mouth daily.     .  meloxicam (MOBIC) 15 MG tablet  Take 15 mg by mouth daily.     No Known Allergies  Review of Systems at time of consultation Constitutional: Positive for activity change. Negative for fever, chills, appetite change and unexpected weight change.  HENT: Positive for congestion.  Eyes: Negative.  Respiratory: Positive for cough (nonproductive), chest tightness, shortness of breath (with exertion) and wheezing. Negative for stridor.  Cardiovascular: Positive for chest pain. Negative for leg swelling.  Genitourinary: Positive for vaginal discharge.  Musculoskeletal: Positive for back pain, joint swelling and arthralgias.  Neurological: Negative.  Hematological: Negative.  All other systems reviewed and are negative.  BP 160/97  Pulse 92  Resp 18  Ht 5\' 6"  (1.676 m)  Wt 135 lb (61.236 kg)  BMI 21.79 kg/m2  SpO2 98%  Physical Exam at time of consultation Constitutional: She is oriented to person, place, and time. She appears well-developed and well-nourished. No distress.  HENT:  Head: Normocephalic and atraumatic.  Eyes: EOM are normal. Pupils are equal, round, and  reactive to light.  Neck: Neck supple. No JVD present. No tracheal deviation present. No thyromegaly present.  Well healed scar  Cardiovascular: Normal rate, regular rhythm, normal heart sounds and intact distal pulses. Exam reveals no gallop and no friction rub.  No murmur heard.  Pulmonary/Chest: Effort normal and breath sounds normal. She has no wheezes. She has no rales.  Abdominal: Soft. There is no tenderness.  Musculoskeletal: Normal range of motion. She exhibits no edema.  Lymphadenopathy:  She has no cervical adenopathy.  Neurological: She is alert and oriented to person, place, and time.  No focal motor deficits  Skin: Skin is warm and dry.  Psychiatric: She has a normal mood and affect.  Diagnostic Tests:  Chest x-ray and CT of the chest were reviewed. Findings as previously noted    Hospital Course:  The patient was admitted  to the hospital and on April 20,013 she underwent the following procedure:  OPERATIVE REPORT  PREOPERATIVE DIAGNOSIS: Hypermetabolic nodule, right upper lobe.  POSTOPERATIVE DIAGNOSIS: Hypermetabolic nodule, right upper lobe.  PROCEDURE: Right video-assisted thoracoscopy, right upper lobectomy.  SURGEON: Salvatore Decent. Dorris Fetch, MD  ASSISTANT: Rowe Clack, PA-C  ANESTHESIA: General.  FINDINGS: Right upper lobe removed without difficulty. No definite  palpable mass within the lobe. Frozen section revealed clear bronchial  margin, normal-appearing lymph nodes sent for permanent pathology. The patient was taken from the operating room to the postanesthesia care unit extubated and in good condition.  Postoperative hospital course:  Patient has done well overall. Pathology is negative for malignancy. This appears to be an inflammatory and fibrotic process. She has progressed nicely in her rehabilitation using standard protocols. Pain has been adequately controlled using standard measures including PCA and transitioned to oral medications. All routine  lines, monitors, and drainage devices have been discontinued in the standard fashion. She has had postoperative atrial fibrillation with subsequent chemical cardioversion to sinus rhythm. She has been started on  Augmentin for. Treatment of possible aspiration with secondary leukocytosis. She also has a chest wound site wound infection that is getting dressing changes. Tentatively he is felt to be stable for possible discharge later today after further evaluation and monitoring of her cardiac rhythm.   Basename 07/14/11 0440 07/13/11 0345  NA 134* 132*  K 4.3 3.2*  CL 96 90*  CO2 34* 34*  GLUCOSE 107* 108*  BUN 16 15  CALCIUM 8.9 8.8    Basename 07/13/11 0345  WBC 13.5*  HGB 11.5*  HCT 33.8*  PLT 163   No results found for this basename: INR:2 in the last 72 hours   Discharge Instructions:  The patient is discharged to home with extensive instructions on wound care and progressive ambulation.  They are instructed not to drive or perform any heavy lifting until returning to see the physician in his office.  Discharge Diagnosis:  RUL MASS Postoperative atrial fibrillation Chest tube site wound infection Secondary Diagnosis: Patient Active Problem List  Diagnoses  . ANEMIA, PERNICIOUS  . HEMORRHOIDS, INTERNAL  . GASTRITIS, CHRONIC  . HIATAL HERNIA  . DIVERTICULOSIS, COLON  . COPD (chronic obstructive pulmonary disease)  . HTN (hypertension)  . B12 deficiency  . OA (osteoarthritis)  . Hypokalemia  . Pulmonary nodule  . Preop cardiovascular exam  . Tobacco abuse   Past Medical History  Diagnosis Date  . COPD (chronic obstructive pulmonary disease)   . HTN (hypertension)   . B12 deficiency   . OA (osteoarthritis)   . Hypokalemia   . Mass of lung        Jeyda, Siebel  Home Medication Instructions [id number]   Printed on:07/15/11 1033  Medication Information                    albuterol (PROVENTIL HFA;VENTOLIN HFA) 108 (90 BASE) MCG/ACT inhaler Inhale 2  puffs into the lungs every 6 (six) hours as needed. For shortness of breath           Fluticasone-Salmeterol (ADVAIR) 500-50 MCG/DOSE AEPB Inhale 2 puffs into the lungs daily.            meloxicam (MOBIC) 15 MG tablet Take 15 mg by mouth daily.           KLOR-CON M20 20 MEQ tablet Take 20 mEq by mouth daily.            ipratropium-albuterol (DUONEB)  0.5-2.5 (3) MG/3ML SOLN Take 3 mLs by nebulization every 4 (four) hours as needed. For shortness of breath           cyanocobalamin (,VITAMIN B-12,) 1000 MCG/ML injection Inject 1,000 mcg into the muscle every 30 (thirty) days.           ALPRAZolam (XANAX) 0.25 MG tablet Take 0.25 mg by mouth at bedtime as needed. For sleep           ranitidine (ZANTAC) 150 MG tablet Take 150 mg by mouth daily.           amiodarone (PACERONE) 400 MG tablet Take 1 tablet (400 mg total) by mouth 2 (two) times daily. For 7 days then take 400 mg once daily           amoxicillin-clavulanate (AUGMENTIN) 875-125 MG per tablet Take 1 tablet by mouth every 12 (twelve) hours.           guaiFENesin (MUCINEX) 600 MG 12 hr tablet Take 2 tablets (1,200 mg total) by mouth 2 (two) times daily.           metoprolol succinate (TOPROL-XL) 25 MG 24 hr tablet Take 1 tablet (25 mg total) by mouth daily.           traMADol (ULTRAM) 50 MG tablet Take 1-2 tablets (50-100 mg total) by mouth every 6 (six) hours as needed.             Disposition: Discharged home  Patient's condition is Good  Gershon Crane, PA-C 07/15/2011  10:33 AM

## 2011-07-23 ENCOUNTER — Ambulatory Visit (INDEPENDENT_AMBULATORY_CARE_PROVIDER_SITE_OTHER): Payer: Medicare Other | Admitting: *Deleted

## 2011-07-23 DIAGNOSIS — J8410 Pulmonary fibrosis, unspecified: Secondary | ICD-10-CM

## 2011-07-23 DIAGNOSIS — D381 Neoplasm of uncertain behavior of trachea, bronchus and lung: Secondary | ICD-10-CM

## 2011-07-23 DIAGNOSIS — Z4802 Encounter for removal of sutures: Secondary | ICD-10-CM

## 2011-07-23 DIAGNOSIS — Z09 Encounter for follow-up examination after completed treatment for conditions other than malignant neoplasm: Secondary | ICD-10-CM

## 2011-07-24 NOTE — Progress Notes (Signed)
This is an addendum to her 07/23/11/ visit for suture removal.  2 chest tube sutures were removed without difficulty. All operative site are well healed.  She is doing well, although she feels she is not breathing as well after surgery as before. Breath sounds are clear....Marland Kitchenreassured. Encouraged to continue use of her incentive spirometer.  She will return as scheduled with cxr.

## 2011-07-27 ENCOUNTER — Other Ambulatory Visit: Payer: Self-pay | Admitting: Thoracic Surgery (Cardiothoracic Vascular Surgery)

## 2011-07-27 DIAGNOSIS — D381 Neoplasm of uncertain behavior of trachea, bronchus and lung: Secondary | ICD-10-CM

## 2011-07-31 ENCOUNTER — Encounter: Payer: Self-pay | Admitting: Thoracic Surgery (Cardiothoracic Vascular Surgery)

## 2011-07-31 ENCOUNTER — Ambulatory Visit
Admission: RE | Admit: 2011-07-31 | Discharge: 2011-07-31 | Disposition: A | Discharge status: Home / Self Care | Payer: Medicare Other | Source: Ambulatory Visit | Attending: Thoracic Surgery (Cardiothoracic Vascular Surgery) | Admitting: Thoracic Surgery (Cardiothoracic Vascular Surgery)

## 2011-07-31 ENCOUNTER — Ambulatory Visit (INDEPENDENT_AMBULATORY_CARE_PROVIDER_SITE_OTHER): Payer: Self-pay | Admitting: Thoracic Surgery (Cardiothoracic Vascular Surgery)

## 2011-07-31 VITALS — BP 144/92 | HR 75 | Resp 16 | Ht 66.0 in | Wt 124.5 lb

## 2011-07-31 DIAGNOSIS — Z09 Encounter for follow-up examination after completed treatment for conditions other than malignant neoplasm: Secondary | ICD-10-CM

## 2011-07-31 DIAGNOSIS — C3490 Malignant neoplasm of unspecified part of unspecified bronchus or lung: Secondary | ICD-10-CM

## 2011-07-31 DIAGNOSIS — D381 Neoplasm of uncertain behavior of trachea, bronchus and lung: Secondary | ICD-10-CM

## 2011-07-31 NOTE — Progress Notes (Signed)
HPI: Shawna [name] returns for a scheduled postoperative followup visit she had a right upper lobectomy on 4/19 for a hypermetabolic 1.1 cm opacity. This was very central the right upper lobe. We did a lobectomy but there was no discrete mass in the specimen. The pathologist looked this extensively and found some foci of smooth muscle proliferation. There was no evidence of cancer.  Postoperatively she had atrial fibrillation which converted to sinus rhythm with amiodarone. Shows a relatively prolonged air leak ultimately that resolved chest tubes were removed and she was discharged. She says that she still has some incisional discomfort, but usually is only taking a pain medication at night. She does get short of breath with activity. She is still using her incisions are all.  Past Medical History  Diagnosis Date  . COPD (chronic obstructive pulmonary disease)   . HTN (hypertension)   . B12 deficiency   . OA (osteoarthritis)   . Hypokalemia   . Mass of lung      Current Outpatient Prescriptions  Medication Sig Dispense Refill  . albuterol (PROVENTIL HFA;VENTOLIN HFA) 108 (90 BASE) MCG/ACT inhaler Inhale 2 puffs into the lungs every 6 (six) hours as needed. For shortness of breath      . ALPRAZolam (XANAX) 0.25 MG tablet Take 0.25 mg by mouth at bedtime as needed. For sleep      . amiodarone (PACERONE) 400 MG tablet Take 1 tablet (400 mg total) by mouth 2 (two) times daily. For 7 days then take 400 mg once daily  70 tablet  1  . cyanocobalamin (,VITAMIN B-12,) 1000 MCG/ML injection Inject 1,000 mcg into the muscle every 30 (thirty) days.      . Fluticasone-Salmeterol (ADVAIR) 500-50 MCG/DOSE AEPB Inhale 2 puffs into the lungs daily.       Marland Kitchen ipratropium-albuterol (DUONEB) 0.5-2.5 (3) MG/3ML SOLN Take 3 mLs by nebulization every 4 (four) hours as needed. For shortness of breath      . KLOR-CON M20 20 MEQ tablet Take 20 mEq by mouth daily.       . meloxicam (MOBIC) 15 MG tablet Take 15 mg by  mouth daily.      . metoprolol succinate (TOPROL-XL) 25 MG 24 hr tablet Take 1 tablet (25 mg total) by mouth daily.  30 tablet  1  . ranitidine (ZANTAC) 150 MG tablet Take 150 mg by mouth daily.        Physical Exam: BP 144/92  Pulse 75  Resp 16  Ht 5\' 6"  (1.676 m)  Wt 124 lb 8 oz (56.473 kg)  BMI 20.09 kg/m2  SpO2 95% Gen. well-appearing 66 year old woman in no acute distress Lungs clear bilaterally Incision clean dry and intact Chest tube site open granulating no sign of infection  Diagnostic Tests: Chest x-ray shows postoperative changes, otherwise unremarkable  Impression: 66 year old woman status post right upper lobectomy for a hypermetabolic nodule. This was not a cancer as we had suspected, but was rather multiple foci of benign smooth muscle hyperproliferation, for which the pathologist didn't have a formal name. She had fairly difficult time in the early postoperative period, but seems to be doing well at this point. We have held her lisinopril hydrochlorothiazide due to low blood pressure in the perioperative period. Her blood pressure is elevated today such shoulder she could resume that. She should stay on her amiodarone about another 2-3 weeks then that can be discontinued.  She may begin driving appropriate precautions were discussed. Otherwise records are unrestricted.  Plan: Dysphagia since  this was such an unusual finding pathologically that we should follow her up for a while make sure nothing else shows up in the lungs elsewhere. Plan see her back in about 6 months with a plain chest x-ray. Then will do a CT scan in about a year.

## 2011-08-14 DIAGNOSIS — Z0271 Encounter for disability determination: Secondary | ICD-10-CM

## 2011-09-13 DIAGNOSIS — Z0279 Encounter for issue of other medical certificate: Secondary | ICD-10-CM

## 2012-02-01 ENCOUNTER — Other Ambulatory Visit: Payer: Self-pay | Admitting: Thoracic Surgery (Cardiothoracic Vascular Surgery)

## 2012-02-01 DIAGNOSIS — C3490 Malignant neoplasm of unspecified part of unspecified bronchus or lung: Secondary | ICD-10-CM

## 2012-02-02 ENCOUNTER — Emergency Department (HOSPITAL_COMMUNITY): Payer: Medicare Other

## 2012-02-02 ENCOUNTER — Encounter (HOSPITAL_COMMUNITY): Payer: Self-pay | Admitting: Physical Medicine and Rehabilitation

## 2012-02-02 ENCOUNTER — Inpatient Hospital Stay (HOSPITAL_COMMUNITY)
Admission: EM | Admit: 2012-02-02 | Discharge: 2012-02-08 | DRG: 190 | Disposition: A | Discharge status: Home / Self Care | Payer: Medicare Other | Attending: Internal Medicine | Admitting: Internal Medicine

## 2012-02-02 DIAGNOSIS — N179 Acute kidney failure, unspecified: Secondary | ICD-10-CM

## 2012-02-02 DIAGNOSIS — Z8249 Family history of ischemic heart disease and other diseases of the circulatory system: Secondary | ICD-10-CM

## 2012-02-02 DIAGNOSIS — D72829 Elevated white blood cell count, unspecified: Secondary | ICD-10-CM

## 2012-02-02 DIAGNOSIS — E876 Hypokalemia: Secondary | ICD-10-CM

## 2012-02-02 DIAGNOSIS — R911 Solitary pulmonary nodule: Secondary | ICD-10-CM

## 2012-02-02 DIAGNOSIS — D510 Vitamin B12 deficiency anemia due to intrinsic factor deficiency: Secondary | ICD-10-CM

## 2012-02-02 DIAGNOSIS — J449 Chronic obstructive pulmonary disease, unspecified: Secondary | ICD-10-CM

## 2012-02-02 DIAGNOSIS — R0902 Hypoxemia: Secondary | ICD-10-CM

## 2012-02-02 DIAGNOSIS — K219 Gastro-esophageal reflux disease without esophagitis: Secondary | ICD-10-CM | POA: Diagnosis present

## 2012-02-02 DIAGNOSIS — Z720 Tobacco use: Secondary | ICD-10-CM

## 2012-02-02 DIAGNOSIS — K2940 Chronic atrophic gastritis without bleeding: Secondary | ICD-10-CM

## 2012-02-02 DIAGNOSIS — J441 Chronic obstructive pulmonary disease with (acute) exacerbation: Secondary | ICD-10-CM | POA: Diagnosis present

## 2012-02-02 DIAGNOSIS — Z01810 Encounter for preprocedural cardiovascular examination: Secondary | ICD-10-CM

## 2012-02-02 DIAGNOSIS — J180 Bronchopneumonia, unspecified organism: Secondary | ICD-10-CM | POA: Diagnosis present

## 2012-02-02 DIAGNOSIS — Z885 Allergy status to narcotic agent status: Secondary | ICD-10-CM

## 2012-02-02 DIAGNOSIS — I10 Essential (primary) hypertension: Secondary | ICD-10-CM | POA: Diagnosis present

## 2012-02-02 DIAGNOSIS — Z87891 Personal history of nicotine dependence: Secondary | ICD-10-CM

## 2012-02-02 DIAGNOSIS — B370 Candidal stomatitis: Secondary | ICD-10-CM | POA: Diagnosis not present

## 2012-02-02 DIAGNOSIS — J019 Acute sinusitis, unspecified: Secondary | ICD-10-CM | POA: Diagnosis present

## 2012-02-02 DIAGNOSIS — J189 Pneumonia, unspecified organism: Secondary | ICD-10-CM

## 2012-02-02 DIAGNOSIS — Z79899 Other long term (current) drug therapy: Secondary | ICD-10-CM

## 2012-02-02 DIAGNOSIS — J440 Chronic obstructive pulmonary disease with acute lower respiratory infection: Principal | ICD-10-CM | POA: Diagnosis present

## 2012-02-02 DIAGNOSIS — K5730 Diverticulosis of large intestine without perforation or abscess without bleeding: Secondary | ICD-10-CM

## 2012-02-02 DIAGNOSIS — Z9981 Dependence on supplemental oxygen: Secondary | ICD-10-CM

## 2012-02-02 DIAGNOSIS — E538 Deficiency of other specified B group vitamins: Secondary | ICD-10-CM | POA: Diagnosis present

## 2012-02-02 DIAGNOSIS — J209 Acute bronchitis, unspecified: Principal | ICD-10-CM | POA: Diagnosis present

## 2012-02-02 DIAGNOSIS — K449 Diaphragmatic hernia without obstruction or gangrene: Secondary | ICD-10-CM

## 2012-02-02 DIAGNOSIS — K648 Other hemorrhoids: Secondary | ICD-10-CM

## 2012-02-02 DIAGNOSIS — Z902 Acquired absence of lung [part of]: Secondary | ICD-10-CM

## 2012-02-02 DIAGNOSIS — E871 Hypo-osmolality and hyponatremia: Secondary | ICD-10-CM | POA: Diagnosis present

## 2012-02-02 DIAGNOSIS — J9620 Acute and chronic respiratory failure, unspecified whether with hypoxia or hypercapnia: Secondary | ICD-10-CM | POA: Diagnosis present

## 2012-02-02 DIAGNOSIS — M1990 Unspecified osteoarthritis, unspecified site: Secondary | ICD-10-CM | POA: Diagnosis present

## 2012-02-02 LAB — URINALYSIS, ROUTINE W REFLEX MICROSCOPIC
Bilirubin Urine: NEGATIVE
Ketones, ur: NEGATIVE mg/dL
Nitrite: NEGATIVE
Protein, ur: NEGATIVE mg/dL
pH: 6 (ref 5.0–8.0)

## 2012-02-02 LAB — COMPREHENSIVE METABOLIC PANEL
ALT: 19 U/L (ref 0–35)
Alkaline Phosphatase: 97 U/L (ref 39–117)
BUN: 27 mg/dL — ABNORMAL HIGH (ref 6–23)
CO2: 25 mEq/L (ref 19–32)
Chloride: 87 mEq/L — ABNORMAL LOW (ref 96–112)
GFR calc Af Amer: 25 mL/min — ABNORMAL LOW (ref >90)
Glucose, Bld: 102 mg/dL — ABNORMAL HIGH (ref 70–99)
Potassium: 4.7 mEq/L (ref 3.5–5.1)
Sodium: 125 mEq/L — ABNORMAL LOW (ref 135–145)
Total Bilirubin: 0.4 mg/dL (ref 0.3–1.2)
Total Protein: 6.8 g/dL (ref 6.0–8.3)

## 2012-02-02 LAB — CBC WITH DIFFERENTIAL/PLATELET
Eosinophils Absolute: 0.1 10*3/uL (ref 0.0–0.7)
Hemoglobin: 12.7 g/dL (ref 12.0–15.0)
Lymphocytes Relative: 3 % — ABNORMAL LOW (ref 12–46)
Lymphs Abs: 0.6 10*3/uL — ABNORMAL LOW (ref 0.7–4.0)
Monocytes Relative: 8 % (ref 3–12)
Neutro Abs: 20.7 10*3/uL — ABNORMAL HIGH (ref 1.7–7.7)
Neutrophils Relative %: 89 % — ABNORMAL HIGH (ref 43–77)
Platelets: 235 10*3/uL (ref 150–400)
RBC: 4.23 MIL/uL (ref 3.87–5.11)
WBC: 23.2 10*3/uL — ABNORMAL HIGH (ref 4.0–10.5)

## 2012-02-02 MED ORDER — ONDANSETRON HCL 4 MG/2ML IJ SOLN: 4.0000 mg | Freq: Four times a day (QID) | INTRAMUSCULAR | Status: DC | PRN

## 2012-02-02 MED ORDER — BENZONATATE 100 MG PO CAPS
200.0000 mg | ORAL_CAPSULE | Freq: Three times a day (TID) | ORAL | Status: DC | PRN
  Administered 2012-02-05: 200 mg via ORAL
  Filled 2012-02-02: qty 2

## 2012-02-02 MED ORDER — DEXTROSE 5 % IV SOLN
500.0000 mg | INTRAVENOUS | Status: DC
  Administered 2012-02-03: 500 mg via INTRAVENOUS
  Filled 2012-02-02 (×2): qty 500

## 2012-02-02 MED ORDER — IPRATROPIUM BROMIDE 0.02 % IN SOLN
0.5000 mg | RESPIRATORY_TRACT | Status: AC | PRN
  Administered 2012-02-02 (×3): 0.5 mg via RESPIRATORY_TRACT
  Filled 2012-02-02 (×2): qty 2.5

## 2012-02-02 MED ORDER — METHYLPREDNISOLONE SODIUM SUCC 125 MG IJ SOLR
125.0000 mg | Freq: Four times a day (QID) | INTRAMUSCULAR | Status: AC
  Administered 2012-02-02 – 2012-02-03 (×3): 125 mg via INTRAVENOUS
  Filled 2012-02-02 (×4): qty 2

## 2012-02-02 MED ORDER — ALBUTEROL SULFATE (5 MG/ML) 0.5% IN NEBU
2.5000 mg | INHALATION_SOLUTION | RESPIRATORY_TRACT | Status: DC
  Administered 2012-02-03 – 2012-02-05 (×16): 2.5 mg via RESPIRATORY_TRACT
  Filled 2012-02-02 (×17): qty 0.5

## 2012-02-02 MED ORDER — ONDANSETRON HCL 4 MG PO TABS: 4.0000 mg | ORAL_TABLET | Freq: Four times a day (QID) | ORAL | Status: DC | PRN

## 2012-02-02 MED ORDER — IPRATROPIUM BROMIDE 0.02 % IN SOLN
0.5000 mg | RESPIRATORY_TRACT | Status: DC
  Administered 2012-02-03 – 2012-02-05 (×15): 0.5 mg via RESPIRATORY_TRACT
  Filled 2012-02-02 (×15): qty 2.5

## 2012-02-02 MED ORDER — ONDANSETRON HCL 4 MG/2ML IJ SOLN: 4.0000 mg | Freq: Three times a day (TID) | INTRAMUSCULAR | Status: DC | PRN

## 2012-02-02 MED ORDER — ALBUTEROL SULFATE (5 MG/ML) 0.5% IN NEBU
2.5000 mg | INHALATION_SOLUTION | RESPIRATORY_TRACT | Status: AC | PRN
  Administered 2012-02-02 (×3): 2.5 mg via RESPIRATORY_TRACT
  Filled 2012-02-02: qty 0.5

## 2012-02-02 MED ORDER — ENOXAPARIN SODIUM 30 MG/0.3ML ~~LOC~~ SOLN
30.0000 mg | SUBCUTANEOUS | Status: DC
  Administered 2012-02-02 – 2012-02-07 (×6): 30 mg via SUBCUTANEOUS
  Filled 2012-02-02 (×7): qty 0.3

## 2012-02-02 MED ORDER — METHYLPREDNISOLONE SODIUM SUCC 125 MG IJ SOLR
125.0000 mg | Freq: Once | INTRAMUSCULAR | Status: DC
  Administered 2012-02-02: 125 mg via INTRAVENOUS

## 2012-02-02 MED ORDER — CEFTRIAXONE SODIUM 1 G IJ SOLR
1.0000 g | INTRAMUSCULAR | Status: DC
  Administered 2012-02-03 – 2012-02-07 (×5): 1 g via INTRAVENOUS
  Filled 2012-02-02 (×6): qty 10

## 2012-02-02 MED ORDER — ALUM & MAG HYDROXIDE-SIMETH 200-200-20 MG/5ML PO SUSP: 30.0000 mL | Freq: Four times a day (QID) | ORAL | Status: DC | PRN

## 2012-02-02 MED ORDER — GUAIFENESIN ER 600 MG PO TB12
1200.0000 mg | ORAL_TABLET | Freq: Two times a day (BID) | ORAL | Status: DC
  Administered 2012-02-02 – 2012-02-06 (×8): 1200 mg via ORAL
  Filled 2012-02-02 (×13): qty 2

## 2012-02-02 MED ORDER — SODIUM CHLORIDE 0.9 % IV SOLN
INTRAVENOUS | Status: DC
  Administered 2012-02-02 – 2012-02-03 (×3): via INTRAVENOUS

## 2012-02-02 MED ORDER — ACETAMINOPHEN 650 MG RE SUPP: 650.0000 mg | Freq: Four times a day (QID) | RECTAL | Status: DC | PRN

## 2012-02-02 MED ORDER — AZITHROMYCIN 500 MG IV SOLR
500.0000 mg | Freq: Once | INTRAVENOUS | Status: AC
  Administered 2012-02-02: 500 mg via INTRAVENOUS
  Filled 2012-02-02: qty 500

## 2012-02-02 MED ORDER — ACETAMINOPHEN 325 MG PO TABS: 650.0000 mg | ORAL_TABLET | Freq: Four times a day (QID) | ORAL | Status: DC | PRN

## 2012-02-02 MED ORDER — METHYLPREDNISOLONE SODIUM SUCC 125 MG IJ SOLR
80.0000 mg | Freq: Two times a day (BID) | INTRAMUSCULAR | Status: AC
  Administered 2012-02-03 – 2012-02-04 (×2): 80 mg via INTRAVENOUS
  Filled 2012-02-02 (×2): qty 1.28

## 2012-02-02 MED ORDER — SODIUM CHLORIDE 0.9 % IV BOLUS (SEPSIS)
1000.0000 mL | Freq: Once | INTRAVENOUS | Status: AC
  Administered 2012-02-02: 1000 mL via INTRAVENOUS

## 2012-02-02 MED ORDER — DEXTROSE 5 % IV SOLN
1.0000 g | Freq: Once | INTRAVENOUS | Status: AC
  Administered 2012-02-02: 1 g via INTRAVENOUS
  Filled 2012-02-02: qty 10

## 2012-02-02 MED ORDER — ZOLPIDEM TARTRATE 5 MG PO TABS
5.0000 mg | ORAL_TABLET | Freq: Every evening | ORAL | Status: DC | PRN
  Administered 2012-02-03 – 2012-02-05 (×2): 5 mg via ORAL
  Filled 2012-02-02 (×2): qty 1

## 2012-02-02 MED ORDER — ENOXAPARIN SODIUM 40 MG/0.4ML ~~LOC~~ SOLN: 40.0000 mg | SUBCUTANEOUS | Status: DC

## 2012-02-02 MED ORDER — SODIUM CHLORIDE 0.9 % IV SOLN
INTRAVENOUS | Status: AC
  Administered 2012-02-02: 23:00:00 via INTRAVENOUS

## 2012-02-02 NOTE — Progress Notes (Signed)
02/02/12 Pharmacy Lovenox Dose adjustment Pt is a 66 yo f with creat 2.23 and weight 56.6 kg. Plan: LMWH 30 q24 for vte px Herby Abraham, Pharm.D. [phone] 02/02/2012 8:51 PM

## 2012-02-02 NOTE — ED Notes (Signed)
Pt presents to department for evaluation of SOB. Ongoing x2 weeks. Pt states she had R upper lobe of lung removed 06/2011 and states chronic issues with bronchitis since. History of COPD, wears 2L O2 at home. Pt states sinus congestion and green mucus. Denies pain at the time. Pt speaking short sentences upon arrival. She is conscious alert and oriented x4.

## 2012-02-02 NOTE — ED Provider Notes (Signed)
History     CSN: [id number]  Arrival date & time 02/02/12  1753   First MD Initiated Contact with Patient 02/02/12 1909      Chief Complaint  Patient presents with  . Shortness of Breath    (Consider location/radiation/quality/duration/timing/severity/associated sxs/prior treatment) HPI Comments: The patient presents with worsening dyspnea for the past 2 weeks, significantly worse today. She does note a history of COPD and wears 2 L of oxygen at home. She has had a productive cough for 2 weeks also. She's been treated with clarithromycin by her primary doctor for the past several weeks  but this has not helped.   Patient is a 66 y.o. female presenting with shortness of breath. The history is provided by the patient. No language interpreter was used.  Shortness of Breath  The current episode started more than 1 week ago. The onset was gradual. The problem occurs continuously. The problem has been gradually worsening. The problem is severe. The symptoms are relieved by beta-agonist inhalers. The symptoms are aggravated by activity. Associated symptoms include cough, shortness of breath and wheezing. Pertinent negatives include no chest pain, no chest pressure, no fever, no rhinorrhea and no stridor. There was no intake of a foreign body. The Heimlich maneuver was not attempted. She has had intermittent steroid use. She has had prior hospitalizations. Her past medical history is significant for past wheezing. She has been less active. Urine output has been normal. The last void occurred less than 6 hours ago. There were no sick contacts. Recently, medical care has been given by the PCP. Services received include medications given.    Past Medical History  Diagnosis Date  . COPD (chronic obstructive pulmonary disease)   . HTN (hypertension)   . B12 deficiency   . OA (osteoarthritis)   . Hypokalemia   . Mass of lung     Past Surgical History  Procedure Date  . Carpal tunnel release     bilateral  . Tubal ligation     x 2  . Cervical spine surgery     Family History  Problem Relation Age of Onset  . Coronary artery disease Mother 69  . Alzheimer's disease Mother     History  Substance Use Topics  . Smoking status: Former Smoker -- 0.3 packs/day for 40 years  . Smokeless tobacco: Never Used     Comment: Down to a few cigarettes per day.  . Alcohol Use: No    OB History    Grav Para Term Preterm Abortions TAB SAB Ect Mult Living                  Review of Systems  Constitutional: Negative for fever, chills, activity change and appetite change.  HENT: Negative for congestion, rhinorrhea, neck pain, neck stiffness and sinus pressure.   Eyes: Negative for discharge and visual disturbance.  Respiratory: Positive for cough, shortness of breath and wheezing. Negative for chest tightness and stridor.   Cardiovascular: Negative for chest pain and leg swelling.  Gastrointestinal: Positive for abdominal pain (sore from coughing). Negative for nausea, vomiting, diarrhea and abdominal distention.  Genitourinary: Negative for decreased urine volume and difficulty urinating.  Musculoskeletal: Negative for back pain and arthralgias.  Skin: Negative for color change and pallor.  Neurological: Negative for weakness, light-headedness and headaches.  Psychiatric/Behavioral: Negative for behavioral problems and agitation.  All other systems reviewed and are negative.    Allergies  Percodan  Home Medications   Current Outpatient Rx  Name  Route  Sig  Dispense  Refill  . ALBUTEROL SULFATE HFA 108 (90 BASE) MCG/ACT IN AERS   Inhalation   Inhale 2 puffs into the lungs every 6 (six) hours as needed. For shortness of breath         . ALBUTEROL SULFATE (2.5 MG/3ML) 0.083% IN NEBU   Nebulization   Take 2.5 mg by nebulization every 4 (four) hours as needed. For shortness of breath         . ALPRAZOLAM 0.25 MG PO TABS   Oral   Take 0.25 mg by mouth 2 (two) times  daily as needed. For sleep         . MAGIC MOUTHWASH   Oral   Take 5 mLs by mouth 2 (two) times daily.         Marland Kitchen CLARITHROMYCIN ER 500 MG PO TB24   Oral   Take 1,000 mg by mouth daily.         . CYANOCOBALAMIN 1000 MCG/ML IJ SOLN   Intramuscular   Inject 1,000 mcg into the muscle every 30 (thirty) days.         Marland Kitchen FLUTICASONE-SALMETEROL 500-50 MCG/DOSE IN AEPB   Inhalation   Inhale 2 puffs into the lungs 2 (two) times daily.          Marland Kitchen HYDROCODONE-HOMATROPINE 5-1.5 MG/5ML PO SYRP   Oral   Take 5-10 mLs by mouth every 6 (six) hours as needed. For cough         . IPRATROPIUM BROMIDE 0.02 % IN SOLN   Nebulization   Take 500 mcg by nebulization every 4 (four) hours.         Marland Kitchen KLOR-CON M20 20 MEQ PO TBCR   Oral   Take 20 mEq by mouth daily.          . MELOXICAM 15 MG PO TABS   Oral   Take 15 mg by mouth daily.         Marland Kitchen RANITIDINE HCL 150 MG PO TABS   Oral   Take 150 mg by mouth 2 (two) times daily.          Marland Kitchen VERAPAMIL HCL ER (CO) 180 MG PO TB24   Oral   Take 180 mg by mouth at bedtime.           BP 104/63  Pulse 93  Temp 97.7 F (36.5 C) (Oral)  Resp 18  SpO2 95%  Physical Exam  Nursing note and vitals reviewed. Constitutional: She is oriented to person, place, and time. She appears well-developed and well-nourished. No distress.  HENT:  Head: Normocephalic and atraumatic.  Mouth/Throat: No oropharyngeal exudate.  Eyes: EOM are normal. Pupils are equal, round, and reactive to light. Right eye exhibits no discharge. Left eye exhibits no discharge.  Neck: Normal range of motion. Neck supple. No JVD present.  Cardiovascular: Normal rate, regular rhythm and normal heart sounds.   Pulmonary/Chest: No stridor. No respiratory distress. She has wheezes (end exp bilat). She has rales (R upper). She exhibits no tenderness.  Abdominal: Soft. Bowel sounds are normal. She exhibits no distension. There is no tenderness. There is no guarding.    Musculoskeletal: Normal range of motion. She exhibits no edema and no tenderness.  Neurological: She is alert and oriented to person, place, and time. No cranial nerve deficit. She exhibits normal muscle tone.  Skin: Skin is warm and dry. No rash noted. She is not diaphoretic.  Psychiatric: She has a normal mood and affect.  Her behavior is normal. Judgment and thought content normal.    ED Course  Procedures (including critical care time)  Labs Reviewed  CBC WITH DIFFERENTIAL - Abnormal; Notable for the following:    WBC 23.2 (*)     HCT 35.9 (*)     RDW 15.8 (*)     Neutrophils Relative 89 (*)     Neutro Abs 20.7 (*)     Lymphocytes Relative 3 (*)     Lymphs Abs 0.6 (*)     Monocytes Absolute 1.8 (*)     All other components within normal limits  COMPREHENSIVE METABOLIC PANEL - Abnormal; Notable for the following:    Sodium 125 (*)     Chloride 87 (*)     Glucose, Bld 102 (*)     BUN 27 (*)     Creatinine, Ser 2.23 (*)     Albumin 2.8 (*)     GFR calc non Af Amer 22 (*)     GFR calc Af Amer 25 (*)     All other components within normal limits   Dg Chest 2 View  02/02/2012  *RADIOLOGY REPORT*  Clinical Data: Shortness of breath.  CHEST - 2 VIEW  Comparison: 11/30/2011  Findings: Prior right upper lobectomy.  Stable scarring throughout both lung fields and elevation of right lung base.  No active infiltrates or failure.  Normal heart size.  Calcified tortuous aorta.  Prior cervical fusion.  IMPRESSION: Stable chest.   Original Report Authenticated By: Davonna Belling, M.D.      1. Community acquired pneumonia   2. COPD (chronic obstructive pulmonary disease)   3. COPD exacerbation   4. Hypoxemia   5. Acute bronchitis   6. Acute sinusitis   7. ARF (acute renal failure)   8. Hyponatremia   9. Leukocytosis   10. HTN (hypertension)     MDM  7:28 PM patient has worsening dyspnea, productive cough, leukocytosis, new-onset renal failure, increased or sure. Chest x-ray read as  stable but does appear to have a possible infiltrate in her right lobe so we'll treat for community car pneumonia with Rocephin and azithromycin. Will admit.  Admitted in stable condition       Warrick Parisian, MD 02/02/12 2309

## 2012-02-02 NOTE — H&P (Signed)
[facility] History and Physical  [name] VQQ:[id number] DOB: [birth year] DOA: 02/02/2012  Referring physician:  PCP: Samuel Jester, DO  Specialists:   Chief Complaint: SOB  HPI: Shawna [name] is a 67 y.o. female with a history of O2 dependent COPD who presents tot he ED with complaints of worsening SOB and wheezing along with sinus and chest congestion for over 2 weeks.   She denies fevers and chills she reports greenish nasal drainage.  Her PCP placed her on Biaxin 10 days ago but her symptons have worsened.  Today PTA, she required more oxygen that her usual 2 liters PRN, she had to increase to 4 liters due to Worsening of her SOB.  In the ED, her chest X-Ray was without findings of an acute infiltrate, but her laboratory studies revealed a leukocytosis, hyponatremia and elevated Bun/Cr.  She was placed on IV Rocephin and Azithromycin to cover for Early CAP based on her clinical findings.     Review of Systems: The patient denies, fever, weight loss, vision loss, decreased hearing, hoarseness, chest pain, syncope, peripheral edema, balance deficits, hemoptysis, abdominal pain, melena, hematochezia, severe indigestion/heartburn, hematuria, incontinence, genital sores,  suspicious skin lesions, transient blindness, difficulty walking, depression, unusual weight change, abnormal bleeding, enlarged lymph nodes, angioedema, and breast masses.    Past Medical History  Diagnosis Date  . COPD (chronic obstructive pulmonary disease)   . HTN (hypertension)   . B12 deficiency   . OA (osteoarthritis)   . Hypokalemia   . Mass of lung     Past Surgical History  Procedure Date  . Carpal tunnel release     bilateral  . Tubal ligation     x 2  . Cervical spine surgery     Medications:  HOME MEDS: Prior to Admission medications   Medication Sig Start Date End Date Taking? Authorizing Provider  albuterol (PROVENTIL HFA;VENTOLIN HFA) 108 (90 BASE) MCG/ACT inhaler Inhale 2  puffs into the lungs every 6 (six) hours as needed. For shortness of breath   Yes Historical Provider, MD  albuterol (PROVENTIL) (2.5 MG/3ML) 0.083% nebulizer solution Take 2.5 mg by nebulization every 4 (four) hours as needed. For shortness of breath   Yes Historical Provider, MD  ALPRAZolam (XANAX) 0.25 MG tablet Take 0.25 mg by mouth 2 (two) times daily as needed. For sleep   Yes Historical Provider, MD  Alum & Mag Hydroxide-Simeth (MAGIC MOUTHWASH) SOLN Take 5 mLs by mouth 2 (two) times daily.   Yes Historical Provider, MD  clarithromycin (BIAXIN XL) 500 MG 24 hr tablet Take 1,000 mg by mouth daily.   Yes Historical Provider, MD  cyanocobalamin (,VITAMIN B-12,) 1000 MCG/ML injection Inject 1,000 mcg into the muscle every 30 (thirty) days.   Yes Historical Provider, MD  Fluticasone-Salmeterol (ADVAIR) 500-50 MCG/DOSE AEPB Inhale 2 puffs into the lungs 2 (two) times daily.    Yes Historical Provider, MD  HYDROcodone-homatropine (HYCODAN) 5-1.5 MG/5ML syrup Take 5-10 mLs by mouth every 6 (six) hours as needed. For cough   Yes Historical Provider, MD  ipratropium (ATROVENT) 0.02 % nebulizer solution Take 500 mcg by nebulization every 4 (four) hours.   Yes Historical Provider, MD  KLOR-CON M20 20 MEQ tablet Take 20 mEq by mouth daily.  05/11/11  Yes Historical Provider, MD  meloxicam (MOBIC) 15 MG tablet Take 15 mg by mouth daily.   Yes Historical Provider, MD  ranitidine (ZANTAC) 150 MG tablet Take 150 mg by mouth 2 (two) times daily.  Yes Historical Provider, MD  verapamil (COVERA HS) 180 MG (CO) 24 hr tablet Take 180 mg by mouth at bedtime.   Yes Historical Provider, MD    Allergies:  Allergies  Allergen Reactions  . Percodan (Oxycodone-Aspirin) Other (See Comments)    Reaction unknown    Social History:  She lives alone, and is able to perform all of her ADLs,  She quirt smoking in 06/2011.   reports that she has quit smoking. She has never used smokeless tobacco. She reports that she does  not drink alcohol or use illicit drugs.   Family History  Problem Relation Age of Onset  . Coronary artery disease Mother 53  . Alzheimer's disease Mother      Physical Exam:  GEN:  Pleasant 66 year old Ill appearing Well nourished and well developed Caucasian Female examined  and in no acute distress; cooperative with exam Filed Vitals:   02/02/12 1806 02/02/12 1915 02/02/12 2000 02/02/12 2045  BP: 112/48 104/63 113/53 129/68  Pulse: 86 93 85 94  Temp: 98.2 F (36.8 C) 97.7 F (36.5 C)    TempSrc: Oral Oral    Resp: 26 18 24    SpO2: 87% 95% 98% 94%   Blood pressure 129/68, pulse 94, temperature 97.7 F (36.5 C), temperature source Oral, resp. rate 24, SpO2 94.00%. PSYCH: She is alert and oriented x4; does not appear anxious does not appear depressed; affect is normal HEENT: Normocephalic and Atraumatic, Mucous membranes pink; PERRLA; EOM intact; Fundi:  Benign;  No scleral icterus, Nares: Patent, Oropharynx: Clear, Fair Dentition, Neck:  FROM, no cervical lymphadenopathy nor thyromegaly or carotid bruit; no JVD; Breasts:: Not examined CHEST WALL: No tenderness CHEST: Decreased Breath Sounds, Diffuse Expiratory Wheezes, No rales, No Rhonchi.   HEART: Regular rate and rhythm; no murmurs rubs or gallops BACK: No kyphosis or scoliosis; no CVA tenderness ABDOMEN: Positive Bowel Sounds, soft non-tender; no masses, no organomegaly. Rectal Exam: Not done EXTREMITIES: No bone or joint deformity; age-appropriate arthropathy of the hands and knees; no cyanosis, clubbing or edema; no ulcerations. Genitalia: not examined PULSES: 2+ and symmetric SKIN: Normal hydration no rash or ulceration CNS: Cranial nerves 2-12 grossly intact no focal neurologic deficit    Labs on Admission:  Basic Metabolic Panel:  Lab 02/02/12 6213  NA 125*  K 4.7  CL 87*  CO2 25  GLUCOSE 102*  BUN 27*  CREATININE 2.23*  CALCIUM 9.7  MG --  PHOS --   Liver Function Tests:  Lab 02/02/12 1808  AST 17   ALT 19  ALKPHOS 97  BILITOT 0.4  PROT 6.8  ALBUMIN 2.8*   No results found for this basename: LIPASE:5,AMYLASE:5 in the last 168 hours No results found for this basename: AMMONIA:5 in the last 168 hours CBC:  Lab 02/02/12 1808  WBC 23.2*  NEUTROABS 20.7*  HGB 12.7  HCT 35.9*  MCV 84.9  PLT 235   Cardiac Enzymes: No results found for this basename: CKTOTAL:5,CKMB:5,CKMBINDEX:5,TROPONINI:5 in the last 168 hours  BNP (last 3 results) No results found for this basename: PROBNP:3 in the last 8760 hours CBG: No results found for this basename: GLUCAP:5 in the last 168 hours  Radiological Exams on Admission: Dg Chest 2 View  02/02/2012  *RADIOLOGY REPORT*  Clinical Data: Shortness of breath.  CHEST - 2 VIEW  Comparison: 11/30/2011  Findings: Prior right upper lobectomy.  Stable scarring throughout both lung fields and elevation of right lung base.  No active infiltrates or failure.  Normal  heart size.  Calcified tortuous aorta.  Prior cervical fusion.  IMPRESSION: Stable chest.   Original Report Authenticated By: Davonna Belling, M.D.     EKG: Independently reviewed. Normal sinus Rhythm and No acute Changes  Assessment: Principal Problem:  *COPD exacerbation Active Problems:  Hypoxemia  COPD (chronic obstructive pulmonary disease)  Acute bronchitis  Acute sinusitis  ARF (acute renal failure)  Leukocytosis  Hyponatremia    Plan:        Admit to Telemetry Bed High Dose Steroid Taper, DuoNebs, Supplemental O2 IV Rocephin and Azithromycin Mucinex, Tessalon Perles PRN IVFs Monitor Electrolytes and BUN/Cr Monitor WBCs DVT prophylaxis with Lovenox    Code Status:  FULL CODE Family Communication:  Daughter at Bedside Disposition Plan:   Return to Home  Time spent:  39 Minutes  Ron Parker [facility] Pager [phone]  If 7PM-7AM, please contact night-coverage [email or web address] Password [facility] 02/02/2012, 8:57 PM

## 2012-02-03 DIAGNOSIS — J9620 Acute and chronic respiratory failure, unspecified whether with hypoxia or hypercapnia: Secondary | ICD-10-CM

## 2012-02-03 LAB — CBC
Platelets: 203 10*3/uL (ref 150–400)
RBC: 4.03 MIL/uL (ref 3.87–5.11)
WBC: 13.6 10*3/uL — ABNORMAL HIGH (ref 4.0–10.5)

## 2012-02-03 LAB — BASIC METABOLIC PANEL
Calcium: 9 mg/dL (ref 8.4–10.5)
GFR calc non Af Amer: 31 mL/min — ABNORMAL LOW (ref >90)
Sodium: 129 mEq/L — ABNORMAL LOW (ref 135–145)

## 2012-02-03 MED ORDER — DOCUSATE SODIUM 100 MG PO CAPS
100.0000 mg | ORAL_CAPSULE | Freq: Two times a day (BID) | ORAL | Status: DC
  Administered 2012-02-03 – 2012-02-08 (×10): 100 mg via ORAL
  Filled 2012-02-03 (×12): qty 1

## 2012-02-03 MED ORDER — POLYETHYLENE GLYCOL 3350 17 G PO PACK
17.0000 g | PACK | Freq: Every day | ORAL | Status: DC
  Administered 2012-02-03 – 2012-02-08 (×4): 17 g via ORAL
  Filled 2012-02-03 (×6): qty 1

## 2012-02-03 NOTE — Progress Notes (Signed)
[facility] PROGRESS NOTE  Shawna [name] RUE:[id number] DOB: Dec 06, 1945 DOA: 02/02/2012 PCP: Samuel Jester, DO  HPI/Subjective: Still short of breath, has cough with minimal sputum production   Assessment/Plan:  Acute on chronic respiratory failure -Likely secondary to COPD exacerbation, patient oxygen requirement increased from 2 L to 4 L. -Started on treatment for acute exacerbation of COPD. -We will repeat chest x-ray in the morning, after hydration chest x-ray might show pneumonia.  COPD exacerbation -Wheezing, cough and shortness of breath. -Patient started on IV antibiotics, systemic steroids, mucolytics, inhaled bronchodilators and oxygen. -Continue current management.  Acute renal failure -Likely secondary to dehydration, IV fluids started. -Patient presented with creatinine of 2.23. She has baseline of 0.8. -Creatinine improving today is 1.67. Continue IV fluids, check BMP in a.m.  Leukocytosis -Significant leukocytosis with total WBC count of 23.2, this came down to 13.6 after initiation of antibiotics.  Hyponatremia -With hypochloremia, likely secondary to dehydration, this is improving with IV fluids. -Check BMP in a.m. Continue IV fluids for now.  Code Status: Full Family Communication:  Disposition Plan: Remain inpatient   Consultants:  None  Procedures:  None  Antibiotics:  Rocephin started on 02/02/2012.  Azithromycin started on 02/02/2012.   Objective: Filed Vitals:   02/03/12 0442 02/03/12 0447 02/03/12 0841 02/03/12 0947  BP: 147/82   127/63  Pulse: 89 89  79  Temp: 97.1 F (36.2 C)   97.3 F (36.3 C)  TempSrc: Oral   Oral  Resp: 21 21  20   Height:      Weight:      SpO2: 100% 100% 96% 100%    Intake/Output Summary (Last 24 hours) at 02/03/12 1202 Last data filed at 02/03/12 0900  Gross per 24 hour  Intake 2281.83 ml  Output   2100 ml  Net 181.83 ml   Filed Weights   02/02/12 2203  Weight: 57.2 kg (126 lb 1.7  oz)    Exam:  General: Alert and awake, oriented x3, not in any acute distress. HEENT: anicteric sclera, pupils reactive to light and accommodation, EOMI CVS: S1-S2 clear, no murmur rubs or gallops Chest: Expiratory wheezes Abdomen: soft nontender, nondistended, normal bowel sounds, no organomegaly Extremities: no cyanosis, clubbing or edema noted bilaterally Neuro: Cranial nerves II-XII intact, no focal neurological deficits  Data Reviewed: Basic Metabolic Panel:  Lab 02/03/12 1478 02/02/12 1808  NA 129* 125*  K 4.4 4.7  CL 93* 87*  CO2 24 25  GLUCOSE 205* 102*  BUN 23 27*  CREATININE 1.67* 2.23*  CALCIUM 9.0 9.7  MG -- --  PHOS -- --   Liver Function Tests:  Lab 02/02/12 1808  AST 17  ALT 19  ALKPHOS 97  BILITOT 0.4  PROT 6.8  ALBUMIN 2.8*   No results found for this basename: LIPASE:5,AMYLASE:5 in the last 168 hours No results found for this basename: AMMONIA:5 in the last 168 hours CBC:  Lab 02/03/12 0522 02/02/12 1808  WBC 13.6* 23.2*  NEUTROABS -- 20.7*  HGB 11.4* 12.7  HCT 34.2* 35.9*  MCV 84.9 84.9  PLT 203 235   Cardiac Enzymes: No results found for this basename: CKTOTAL:5,CKMB:5,CKMBINDEX:5,TROPONINI:5 in the last 168 hours BNP (last 3 results) No results found for this basename: PROBNP:3 in the last 8760 hours CBG: No results found for this basename: GLUCAP:5 in the last 168 hours  No results found for this or any previous visit (from the past 240 hour(s)).   Studies: Dg Chest 2 View  02/02/2012  *RADIOLOGY  REPORT*  Clinical Data: Shortness of breath.  CHEST - 2 VIEW  Comparison: 11/30/2011  Findings: Prior right upper lobectomy.  Stable scarring throughout both lung fields and elevation of right lung base.  No active infiltrates or failure.  Normal heart size.  Calcified tortuous aorta.  Prior cervical fusion.  IMPRESSION: Stable chest.   Original Report Authenticated By: Davonna Belling, M.D.     Scheduled Meds:   . [COMPLETED] sodium  chloride   Intravenous STAT  . ipratropium  0.5 mg Nebulization Q4H   And  . albuterol  2.5 mg Nebulization Q4H  . [COMPLETED] azithromycin (ZITHROMAX) 500 MG IVPB  500 mg Intravenous Once  . azithromycin  500 mg Intravenous Q24H  . [COMPLETED] cefTRIAXone (ROCEPHIN)  IV  1 g Intravenous Once  . cefTRIAXone (ROCEPHIN)  IV  1 g Intravenous Q24H  . docusate sodium  100 mg Oral BID  . enoxaparin (LOVENOX) injection  30 mg Subcutaneous Q24H  . guaiFENesin  1,200 mg Oral BID  . methylPREDNISolone (SOLU-MEDROL) injection  125 mg Intravenous Q6H  . methylPREDNISolone (SOLU-MEDROL) injection  80 mg Intravenous Q12H  . polyethylene glycol  17 g Oral Daily  . [COMPLETED] sodium chloride  1,000 mL Intravenous Once  . [DISCONTINUED] enoxaparin (LOVENOX) injection  40 mg Subcutaneous Q24H  . [COMPLETED] methylPREDNISolone (SOLU-MEDROL) injection  125 mg Intravenous Once   Continuous Infusions:   . sodium chloride 75 mL/hr at 02/03/12 0808    Principal Problem:  *COPD exacerbation Active Problems:  COPD (chronic obstructive pulmonary disease)  Hypoxemia  Acute bronchitis  Acute sinusitis  ARF (acute renal failure)  Leukocytosis  Hyponatremia    Time spent: 35 minutes    [facility]  [facility] Pager [phone]. If 8PM-8AM, please contact night-coverage at [email or web address], password [facility] 02/03/2012, 12:02 PM  LOS: 1 day

## 2012-02-03 NOTE — Progress Notes (Signed)
Patient arrived from emergency room via stretcher, patient placed on tele and continuous pulse ox, call light within reach.  Patient admitted with COPD exacerbation and is home dependent on oxygen 2L via nasal cannula.  Patient has expiratory wheezing and diminished breath sounds.  Skin intact with bruising to bilateral arms.  Plan of care is administer breathing treatments, abx, and safety.  Medical history includes: Chief Complaint: SOB  HPI: Shawna [name] is a 66 y.o. female with a history of O2 dependent COPD who presents tot he ED with complaints of worsening SOB and wheezing along with sinus and chest congestion for over 2 weeks. She denies fevers and chills she reports greenish nasal drainage. Her PCP placed her on Biaxin 10 days ago but her symptons have worsened. Today PTA, she required more oxygen that her usual 2 liters PRN, she had to increase to 4 liters due to Worsening of her SOB. In the ED, her chest X-Ray was without findings of an acute infiltrate, but her laboratory studies revealed a leukocytosis, hyponatremia and elevated Bun/Cr. She was placed on IV Rocephin and Azithromycin to cover for Early CAP based on her clinical findings.  Review of Systems: The patient denies, fever, weight loss, vision loss, decreased hearing, hoarseness, chest pain, syncope, peripheral edema, balance deficits, hemoptysis, abdominal pain, melena, hematochezia, severe indigestion/heartburn, hematuria, incontinence, genital sores, suspicious skin lesions, transient blindness, difficulty walking, depression, unusual weight change, abnormal bleeding, enlarged lymph nodes, angioedema, and breast masses.  Past Medical History   Diagnosis  Date   .  COPD (chronic obstructive pulmonary disease)    .  HTN (hypertension)    .  B12 deficiency    .  OA (osteoarthritis)    .  Hypokalemia    .  Mass of lung     Will continue to monitor and treat.  Macarthur Critchley, RN

## 2012-02-03 NOTE — ED Provider Notes (Addendum)
I saw and evaluated the patient, reviewed the resident's note and I agree with the findings and plan.  Hx O2 dependent COPD with dyspnea, cough x 2 weeks. No distress, diminished BS, wheezes.  No chest pain. Increased O2 requirement. Suspect CAP, COPD exacerbation.   Date: 02/02/2012  Rate: 82  Rhythm: normal sinus rhythm  QRS Axis: normal  Intervals: normal  ST/T Wave abnormalities: normal  Conduction Disutrbances:none  Narrative Interpretation:   Old EKG Reviewed: unchanged   Glynn Octave, MD 02/03/12 0153  Glynn Octave, MD 04/22/12 1527

## 2012-02-03 NOTE — Progress Notes (Signed)
Patient has not had a bowel movement since 01/30/12.  Can we have an order for a stool softener and miralax?  Thank you.  Will continue to monitor.  Macarthur Critchley, RN

## 2012-02-04 ENCOUNTER — Inpatient Hospital Stay (HOSPITAL_COMMUNITY): Payer: Medicare Other

## 2012-02-04 LAB — BASIC METABOLIC PANEL
CO2: 24 mEq/L (ref 19–32)
Calcium: 9.5 mg/dL (ref 8.4–10.5)
Chloride: 97 mEq/L (ref 96–112)
Potassium: 4 mEq/L (ref 3.5–5.1)
Sodium: 132 mEq/L — ABNORMAL LOW (ref 135–145)

## 2012-02-04 LAB — CBC
HCT: 33.1 % — ABNORMAL LOW (ref 36.0–46.0)
MCV: 84.2 fL (ref 78.0–100.0)
Platelets: 220 10*3/uL (ref 150–400)
RBC: 3.93 MIL/uL (ref 3.87–5.11)
WBC: 10.4 10*3/uL (ref 4.0–10.5)

## 2012-02-04 MED ORDER — IOHEXOL 350 MG/ML SOLN
100.0000 mL | Freq: Once | INTRAVENOUS | Status: AC | PRN
  Administered 2012-02-04: 100 mL via INTRAVENOUS

## 2012-02-04 MED ORDER — AZITHROMYCIN 500 MG PO TABS
500.0000 mg | ORAL_TABLET | Freq: Every day | ORAL | Status: DC
  Administered 2012-02-04 – 2012-02-07 (×4): 500 mg via ORAL
  Filled 2012-02-04 (×5): qty 1

## 2012-02-04 NOTE — Progress Notes (Signed)
INITIAL ADULT NUTRITION ASSESSMENT Date: 02/04/2012   Time: 3:47 PM  INTERVENTION:  No nutrition intervention at this time -- pt declined RD to follow for nutrition care plan  DOCUMENTATION CODES Per approved criteria  -Not Applicable   Reason for Assessment: Malnutrition Screening Tool Report  ASSESSMENT: Female 66 y.o.  Dx: Acute-on-chronic respiratory failure  Hx:  Past Medical History  Diagnosis Date  . COPD (chronic obstructive pulmonary disease)   . HTN (hypertension)   . B12 deficiency   . OA (osteoarthritis)   . Hypokalemia   . Mass of lung     Related Meds:     . ipratropium  0.5 mg Nebulization Q4H   And  . albuterol  2.5 mg Nebulization Q4H  . azithromycin  500 mg Oral q1800  . cefTRIAXone (ROCEPHIN)  IV  1 g Intravenous Q24H  . docusate sodium  100 mg Oral BID  . enoxaparin (LOVENOX) injection  30 mg Subcutaneous Q24H  . guaiFENesin  1,200 mg Oral BID  . [COMPLETED] methylPREDNISolone (SOLU-MEDROL) injection  80 mg Intravenous Q12H  . polyethylene glycol  17 g Oral Daily  . [DISCONTINUED] azithromycin  500 mg Intravenous Q24H    Ht: 5' 7.2" (170.7 cm)  Wt: 126 lb 1.7 oz (57.2 kg)  Wt Readings from Last 10 Encounters:  02/02/12 126 lb 1.7 oz (57.2 kg)  07/31/11 124 lb 8 oz (56.473 kg)  07/08/11 139 lb 15.9 oz (63.5 kg)  07/08/11 139 lb 15.9 oz (63.5 kg)  07/04/11 130 lb 4.7 oz (59.1 kg)  06/12/11 135 lb (61.236 kg)  05/23/11 130 lb (58.968 kg)  05/15/11 135 lb (61.236 kg)    Ideal Wt: 61.3 kg % Ideal Wt: 90%  Usual Wt: 135 lb % Usual Wt: 93%  Body mass index is 19.63 kg/(m^2).  Food/Nutrition Related Hx: recent weight lost without trying and decreased appetite per admission nutrition screen  Labs:  CMP     Component Value Date/Time   NA 132* 02/04/2012 0636   K 4.0 02/04/2012 0636   CL 97 02/04/2012 0636   CO2 24 02/04/2012 0636   GLUCOSE 154* 02/04/2012 0636   BUN 25* 02/04/2012 0636   CREATININE 1.46* 02/04/2012 0636   CALCIUM 9.5 02/04/2012 0636   PROT 6.8 02/02/2012 1808   ALBUMIN 2.8* 02/02/2012 1808   AST 17 02/02/2012 1808   ALT 19 02/02/2012 1808   ALKPHOS 97 02/02/2012 1808   BILITOT 0.4 02/02/2012 1808   GFRNONAA 36* 02/04/2012 0636   GFRAA 42* 02/04/2012 0636     Intake/Output Summary (Last 24 hours) at 02/04/12 1548 Last data filed at 02/04/12 1345  Gross per 24 hour  Intake   2680 ml  Output   1050 ml  Net   1630 ml    Diet Order: Cardiac  Supplements/Tube Feeding: N/A  IVF:    sodium chloride Last Rate: 75 mL/hr at 02/04/12 1452    Estimated Nutritional Needs:   Kcal: 1400-1600 Protein: 65-75 gm Fluid: > 1.5 L  Patient presented to ED with complaints of worsening SOB and wheezing along with sinus/chest congestion for over 2 weeks; limited responsiveness with RD visitation; she states her appetite is "OK, I guess;" PO intake variable per flowsheet records at 0-100%; patient has lost some weight since February 2013, however, not significant for time frame; declined addition of snacks and/or supplements.  NUTRITION DIAGNOSIS: -Inadequate oral intake (NI-2.1).  Status: Ongoing  RELATED TO: decreased appetite  AS EVIDENCE BY: PO intake 0-100%  MONITORING/EVALUATION(Goals):  Goal: Oral intake with meals to meet >/= 90% of estimated nutrition needs Monitor: PO intake, weight, labs, I/O's  EDUCATION NEEDS: -No education needs identified at this time  Kirkland Hun, RD, LDN Pager #: [phone] After-Hours Pager #: [phone]

## 2012-02-04 NOTE — Progress Notes (Signed)
[facility] PROGRESS NOTE  Shawna [name] WUJ:[id number] DOB: February 08, 1946 DOA: 02/02/2012 PCP: Samuel Jester, DO  HPI/Subjective: Still short of breath, although she feels better but the cough still there.   Assessment/Plan:  Acute on chronic respiratory failure -Likely secondary to COPD exacerbation, patient oxygen requirement increased from 2 L to 4 L. -Started on treatment for acute exacerbation of COPD. -Even after hydration chest x-ray did not show infiltrates, patient still short of breath and tachycardic. -I will obtain CT angio of the chest to rule out PE.  COPD exacerbation -Wheezing, cough and shortness of breath. -Patient started on IV antibiotics, systemic steroids, mucolytics, inhaled bronchodilators and oxygen. -Continue current management.  Acute renal failure -Likely secondary to dehydration, IV fluids started. -Patient presented with creatinine of 2.23. She has baseline of 0.8. -Creatinine improving today is 1.47. Continue IV fluids, check BMP in a.m.  Leukocytosis -Significant leukocytosis with total WBC count of 23.2, this came down to 10.4 after initiation of antibiotics.  Hyponatremia -With hypochloremia, likely secondary to dehydration, this is improving with IV fluids. -Check BMP in a.m. Continue IV fluids for now.  Code Status: Full Family Communication:  Disposition Plan: Remain inpatient   Consultants:  None  Procedures:  None  Antibiotics:  Rocephin started on 02/02/2012.  Azithromycin started on 02/02/2012.   Objective: Filed Vitals:   02/04/12 0358 02/04/12 0437 02/04/12 0807 02/04/12 0937  BP:  158/90  164/79  Pulse:  104  98  Temp:  97.6 F (36.4 C)  98 F (36.7 C)  TempSrc:  Oral  Oral  Resp:  20  18  Height:      Weight:      SpO2: 97% 97% 97% 96%    Intake/Output Summary (Last 24 hours) at 02/04/12 1059 Last data filed at 02/04/12 0912  Gross per 24 hour  Intake   2782 ml  Output   1400 ml  Net   1382  ml   Filed Weights   02/02/12 2203  Weight: 57.2 kg (126 lb 1.7 oz)    Exam:  General: Alert and awake, oriented x3, not in any acute distress. HEENT: anicteric sclera, pupils reactive to light and accommodation, EOMI CVS: S1-S2 clear, no murmur rubs or gallops Chest: Expiratory wheezes Abdomen: soft nontender, nondistended, normal bowel sounds, no organomegaly Extremities: no cyanosis, clubbing or edema noted bilaterally Neuro: Cranial nerves II-XII intact, no focal neurological deficits  Data Reviewed: Basic Metabolic Panel:  Lab 02/04/12 9562 02/03/12 0522 02/02/12 1808  NA 132* 129* 125*  K 4.0 4.4 4.7  CL 97 93* 87*  CO2 24 24 25   GLUCOSE 154* 205* 102*  BUN 25* 23 27*  CREATININE 1.46* 1.67* 2.23*  CALCIUM 9.5 9.0 9.7  MG -- -- --  PHOS -- -- --   Liver Function Tests:  Lab 02/02/12 1808  AST 17  ALT 19  ALKPHOS 97  BILITOT 0.4  PROT 6.8  ALBUMIN 2.8*   No results found for this basename: LIPASE:5,AMYLASE:5 in the last 168 hours No results found for this basename: AMMONIA:5 in the last 168 hours CBC:  Lab 02/04/12 0636 02/03/12 0522 02/02/12 1808  WBC 10.4 13.6* 23.2*  NEUTROABS -- -- 20.7*  HGB 11.4* 11.4* 12.7  HCT 33.1* 34.2* 35.9*  MCV 84.2 84.9 84.9  PLT 220 203 235   Cardiac Enzymes: No results found for this basename: CKTOTAL:5,CKMB:5,CKMBINDEX:5,TROPONINI:5 in the last 168 hours BNP (last 3 results) No results found for this basename: PROBNP:3 in the last 8760  hours CBG: No results found for this basename: GLUCAP:5 in the last 168 hours  No results found for this or any previous visit (from the past 240 hour(s)).   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  Surgical clips are present in the right suprahilar region and there is juxtaphrenic  peaking of the right hemidiaphragm. Similar appearance of residual linear scarring in the right base and lingula.  No new focal airspace opacity, pleural effusion or pneumothorax.  Unchanged pulmonary hyperexpansion suggesting underlying COPD.  Incompletely imaged anterior cervical stabilization hardware at C6-C7.  No acute osseous abnormality. Mild gaseous distension of the colon incidentally noted.  IMPRESSION:  No acute cardiopulmonary disease.  No significant interval change in the appearance of the chest compared to 02/02/2012.   Original Report Authenticated By: Malachy Moan, M.D.    Dg Chest 2 View  02/02/2012  *RADIOLOGY REPORT*  Clinical Data: Shortness of breath.  CHEST - 2 VIEW  Comparison: 11/30/2011  Findings: Prior right upper lobectomy.  Stable scarring throughout both lung fields and elevation of right lung base.  No active infiltrates or failure.  Normal heart size.  Calcified tortuous aorta.  Prior cervical fusion.  IMPRESSION: Stable chest.   Original Report Authenticated By: Davonna Belling, M.D.     Scheduled Meds:    . [COMPLETED] sodium chloride   Intravenous STAT  . ipratropium  0.5 mg Nebulization Q4H   And  . albuterol  2.5 mg Nebulization Q4H  . azithromycin  500 mg Intravenous Q24H  . cefTRIAXone (ROCEPHIN)  IV  1 g Intravenous Q24H  . docusate sodium  100 mg Oral BID  . enoxaparin (LOVENOX) injection  30 mg Subcutaneous Q24H  . guaiFENesin  1,200 mg Oral BID  . [COMPLETED] methylPREDNISolone (SOLU-MEDROL) injection  125 mg Intravenous Q6H  . [COMPLETED] methylPREDNISolone (SOLU-MEDROL) injection  80 mg Intravenous Q12H  . polyethylene glycol  17 g Oral Daily   Continuous Infusions:    . sodium chloride 75 mL/hr at 02/03/12 1953    Principal Problem:  *Acute-on-chronic respiratory failure Active Problems:  COPD (chronic obstructive pulmonary disease)  COPD exacerbation  Hypoxemia  Acute sinusitis  ARF (acute renal failure)  Leukocytosis   Hyponatremia    Time spent: 35 minutes    [facility] A  [facility] Pager [phone]. If 8PM-8AM, please contact night-coverage at [email or web address], password [facility] 02/04/2012, 10:59 AM  LOS: 2 days

## 2012-02-04 NOTE — Progress Notes (Signed)
PHARMACIST - PHYSICIAN COMMUNICATION DR:   Alain Marion al CONCERNING: Antibiotic IV to Oral Route Change Policy  RECOMMENDATION: This patient is receiving azithromycin by the intravenous route.  Based on criteria approved by the Pharmacy and Therapeutics Committee, the antibiotic(s) is/are being converted to the equivalent oral dose form(s).   DESCRIPTION: These criteria include:  Patient being treated for a respiratory tract infection, urinary tract infection, or cellulitis  The patient is not neutropenic and does not exhibit a GI malabsorption state  The patient is eating (either orally or via tube) and/or has been taking other orally administered medications for a least 24 hours  The patient is improving clinically and has a Tmax < 100.5  If you have questions about this conversion, please contact the Pharmacy Department  []   [phone] )  [facility] [x]   [phone] )  [facility]  []   [phone] )  [facility] []   [phone] )  [facility]   Herby Abraham, [place].D. [phone] 02/04/2012 2:14 PM

## 2012-02-05 ENCOUNTER — Encounter: Payer: Medicare Other | Admitting: Thoracic Surgery (Cardiothoracic Vascular Surgery)

## 2012-02-05 DIAGNOSIS — J189 Pneumonia, unspecified organism: Secondary | ICD-10-CM

## 2012-02-05 DIAGNOSIS — F17200 Nicotine dependence, unspecified, uncomplicated: Secondary | ICD-10-CM

## 2012-02-05 LAB — BASIC METABOLIC PANEL
BUN: 30 mg/dL — ABNORMAL HIGH (ref 6–23)
Chloride: 101 mEq/L (ref 96–112)
GFR calc Af Amer: 55 mL/min — ABNORMAL LOW (ref >90)
Glucose, Bld: 102 mg/dL — ABNORMAL HIGH (ref 70–99)
Potassium: 3.8 mEq/L (ref 3.5–5.1)

## 2012-02-05 LAB — CBC
HCT: 34.4 % — ABNORMAL LOW (ref 36.0–46.0)
HCT: 36 % (ref 36.0–46.0)
Hemoglobin: 11.6 g/dL — ABNORMAL LOW (ref 12.0–15.0)
MCH: 28.8 pg (ref 26.0–34.0)
MCHC: 33.7 g/dL (ref 30.0–36.0)
MCV: 85.1 fL (ref 78.0–100.0)
Platelets: 230 10*3/uL (ref 150–400)
RBC: 4.13 MIL/uL (ref 3.87–5.11)
RBC: 4.23 MIL/uL (ref 3.87–5.11)

## 2012-02-05 MED ORDER — ALBUTEROL SULFATE (5 MG/ML) 0.5% IN NEBU
2.5000 mg | INHALATION_SOLUTION | Freq: Four times a day (QID) | RESPIRATORY_TRACT | Status: DC
  Administered 2012-02-05 – 2012-02-08 (×12): 2.5 mg via RESPIRATORY_TRACT
  Filled 2012-02-05 (×11): qty 0.5

## 2012-02-05 MED ORDER — IPRATROPIUM BROMIDE 0.02 % IN SOLN
0.5000 mg | Freq: Four times a day (QID) | RESPIRATORY_TRACT | Status: DC
  Administered 2012-02-05 – 2012-02-08 (×12): 0.5 mg via RESPIRATORY_TRACT
  Filled 2012-02-05 (×11): qty 2.5

## 2012-02-05 MED ORDER — ALBUTEROL SULFATE (5 MG/ML) 0.5% IN NEBU: 2.5000 mg | INHALATION_SOLUTION | RESPIRATORY_TRACT | Status: DC

## 2012-02-05 MED ORDER — ALBUTEROL SULFATE (5 MG/ML) 0.5% IN NEBU: 2.5000 mg | INHALATION_SOLUTION | RESPIRATORY_TRACT | Status: DC | PRN

## 2012-02-05 MED ORDER — IPRATROPIUM BROMIDE 0.02 % IN SOLN: 0.5000 mg | Freq: Four times a day (QID) | RESPIRATORY_TRACT | Status: DC

## 2012-02-05 MED ORDER — PREDNISONE 50 MG PO TABS
60.0000 mg | ORAL_TABLET | Freq: Every day | ORAL | Status: DC
  Administered 2012-02-06 – 2012-02-07 (×2): 60 mg via ORAL
  Filled 2012-02-05 (×3): qty 1

## 2012-02-05 NOTE — Care Management Note (Addendum)
    Page 1 of 2   02/08/2012     12:12:55 PM   CARE MANAGEMENT NOTE 02/08/2012  Patient:  Shawna [name], Shawna [name]   Account Number:  [id number]  Date Initiated:  02/05/2012  Documentation initiated by:  Letha Cape  Subjective/Objective Assessment:   dx acute on lchronic resp failure  admit- lives alone.     Action/Plan:   Anticipated DC Date:  02/08/2012   Anticipated DC Plan:  HOME W HOME HEALTH SERVICES      DC Planning Services  CM consult      [facility] Choice  HOME HEALTH   Choice offered to / List presented to:  C-1 Patient        HH arranged  HH-1 RN  HH-10 DISEASE MANAGEMENT      HH agency  [facility]   Status of service:  Completed, signed off Medicare Important Message given?   (If response is "NO", the following Medicare IM given date fields will be blank) Date Medicare IM given:   Date Additional Medicare IM given:    Discharge Disposition:  HOME W HOME HEALTH SERVICES  Per UR Regulation:  Reviewed for med. necessity/level of care/duration of stay  If discussed at Long Length of Stay Meetings, dates discussed:    Comments:  02/08/12 10:18 Letha Cape RN, BSN [phone] patient is for dc to home with [facility] services today.  02/07/12 12:44 Letha Cape RN,BSN [phone] patient chose [facility] for [facility] for COPD program, referral made to [facility], Lupita Leash notified.  Soc will begin 24-48 hrs post discharge.  02/05/12 17:08 Letha Cape RN, BSN [phone] patient lives alone, NCM will continue to follow for dc needs.

## 2012-02-05 NOTE — Progress Notes (Signed)
Pt asked for something to help her sleep. Nurse went in with med. Pt asleep.

## 2012-02-05 NOTE — Progress Notes (Signed)
[facility] PROGRESS NOTE  Shawna [name] JYN:[id number] DOB: 1945/05/07 DOA: 02/02/2012 PCP: Samuel Jester, DO  HPI/Subjective: Still short of breath, although she feels better but the cough still there.   Assessment/Plan:  Acute on chronic respiratory failure -Likely secondary to COPD exacerbation, patient oxygen requirement increased from 2 L to 4 L. -Please note about 4 weeks ago patient was on oxygen. -Started on treatment for acute exacerbation of COPD. -Even after hydration chest x-ray did not show infiltrates, patient still short of breath and tachycardic. -CT angio of the chest done, showed no PE. There is evidence of bronchopneumonia.  Bronchopneumonia -Continue IV antibiotics, mucolytics, inhaled bronchodilators and oxygen. -CT angio of the chest showed no evidence of PE, but she has bronchopneumonia.  COPD exacerbation -Wheezing, cough and shortness of breath. -Patient started on IV antibiotics, systemic steroids, mucolytics, inhaled bronchodilators and oxygen. -Although she is wheezing, I will taper down the steroids.  Acute renal failure -Likely secondary to dehydration, IV fluids started. -Patient presented with creatinine of 2.23. She has baseline of 0.8. -Creatinine improving today is 1.17. Continue gentle hydration with IV fluids, check BMP in a.m.  Leukocytosis -Significant leukocytosis with total WBC count of 23.2, this came down to 10.4 after initiation of antibiotics.  Hyponatremia -With hypochloremia, likely secondary to dehydration, this is improving with IV fluids. -Check BMP in a.m. Continue IV fluids for now.  Code Status: Full Family Communication:  Disposition Plan: Remain inpatient likely to be discharged in one to 2 days. Depending on her status.   Consultants:  None  Procedures:  None  Antibiotics:  Rocephin started on 02/02/2012.  Azithromycin started on 02/02/2012.   Objective: Filed Vitals:   02/05/12 0531  02/05/12 0540 02/05/12 1003 02/05/12 1025  BP:  163/93  158/92  Pulse:  95  100  Temp:  97.6 F (36.4 C)  98 F (36.7 C)  TempSrc:  Oral  Oral  Resp: 18 18  20   Height:      Weight:      SpO2:  95% 100% 100%    Intake/Output Summary (Last 24 hours) at 02/05/12 1119 Last data filed at 02/05/12 1024  Gross per 24 hour  Intake 1472.25 ml  Output   4050 ml  Net -2577.75 ml   Filed Weights   02/02/12 2203  Weight: 57.2 kg (126 lb 1.7 oz)    Exam:  General: Alert and awake, oriented x3, not in any acute distress. HEENT: anicteric sclera, pupils reactive to light and accommodation, EOMI CVS: S1-S2 clear, no murmur rubs or gallops Chest: Expiratory wheezes Abdomen: soft nontender, nondistended, normal bowel sounds, no organomegaly Extremities: no cyanosis, clubbing or edema noted bilaterally Neuro: Cranial nerves II-XII intact, no focal neurological deficits  Data Reviewed: Basic Metabolic Panel:  Lab 02/05/12 8657 02/04/12 0636 02/03/12 0522 02/02/12 1808  NA 137 132* 129* 125*  K 3.8 4.0 4.4 4.7  CL 101 97 93* 87*  CO2 28 24 24 25   GLUCOSE 102* 154* 205* 102*  BUN 30* 25* 23 27*  CREATININE 1.17* 1.46* 1.67* 2.23*  CALCIUM 9.6 9.5 9.0 9.7  MG -- -- -- --  PHOS -- -- -- --   Liver Function Tests:  Lab 02/02/12 1808  AST 17  ALT 19  ALKPHOS 97  BILITOT 0.4  PROT 6.8  ALBUMIN 2.8*   No results found for this basename: LIPASE:5,AMYLASE:5 in the last 168 hours No results found for this basename: AMMONIA:5 in the last 168 hours CBC:  Lab  02/05/12 0700 02/04/12 0636 02/03/12 0522 02/02/12 1808  WBC 11.8* 10.4 13.6* 23.2*  NEUTROABS -- -- -- 20.7*  HGB 11.6* 11.4* 11.4* 12.7  HCT 34.4* 33.1* 34.2* 35.9*  MCV 83.3 84.2 84.9 84.9  PLT 215 220 203 235   Cardiac Enzymes: No results found for this basename: CKTOTAL:5,CKMB:5,CKMBINDEX:5,TROPONINI:5 in the last 168 hours BNP (last 3 results) No results found for this basename: PROBNP:3 in the last 8760  hours CBG: No results found for this basename: GLUCAP:5 in the last 168 hours  No results found for this or any previous visit (from the past 240 hour(s)).   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  Surgical clips are present in the right suprahilar region and there is juxtaphrenic peaking of the right hemidiaphragm. Similar appearance of residual linear scarring in the right base and lingula.  No new focal airspace opacity, pleural effusion or pneumothorax.  Unchanged pulmonary hyperexpansion suggesting underlying COPD.  Incompletely imaged anterior cervical stabilization hardware at C6-C7.  No acute osseous abnormality. Mild gaseous distension of the colon incidentally noted.  IMPRESSION:  No acute cardiopulmonary disease.  No significant interval change in the appearance of the chest compared to 02/02/2012.   Original Report Authenticated By: Malachy Moan, M.D.    Ct Angio Chest Pe W/cm &/or Wo Cm  02/04/2012  *RADIOLOGY REPORT*  Clinical Data: Persistent cough, pain.  Chest congestion.  CT ANGIOGRAPHY CHEST  Technique:  Multidetector CT imaging of the chest using the standard protocol during bolus administration of intravenous contrast. Multiplanar reconstructed images including MIPs were obtained and reviewed to evaluate the vascular anatomy.  Contrast: OMNIPAQUE IOHEXOL 350 MG/ML SOLN  Comparison: PET 05/22/2011 and CT chest 05/10/2011.  Findings: No pulmonary embolus.  No pathologically enlarged mediastinal, hilar or axillary lymph nodes.  Coronary artery calcification.  Heart size normal.  No pericardial effusion.  Trace left pleural fluid.  Emphysema.  Postoperative changes of right upper lobectomy.  Confluent soft tissue and volume loss in the right middle lobe.  Peribronchovascular  nodularity and airspace disease are seen predominantly in the lower lobes, right greater than left.  A 7 mm (9 x 5 mm) subpleural nodule in the left lower lobe (image 34) appears more prominent than on 05/10/2011 (image 31).  Debris seen dependently in the upper trachea.  Incidental imaging of the upper abdomen shows a 6 mm low attenuation lesion in the hepatic dome, too small to characterize. No worrisome lytic or sclerotic lesions.  IMPRESSION:  1.  No pulmonary embolus. 2.  Postoperative changes of right upper lobectomy. 3.  New peribronchovascular nodularity, ground-glass and airspace consolidation in the right middle lobe and both lower lobes, indicative of bronchopneumonia.  Follow-up to clearing is recommended. 4.  Tiny left pleural effusion. 5.  Coronary artery calcification.   Original Report Authenticated By: Leanna Battles, M.D.     Scheduled Meds:    . ipratropium  0.5 mg Nebulization Q4H   And  . albuterol  2.5 mg Nebulization Q4H  . azithromycin  500 mg Oral q1800  . cefTRIAXone (ROCEPHIN)  IV  1 g Intravenous Q24H  . docusate sodium  100 mg Oral BID  . enoxaparin (LOVENOX) injection  30 mg Subcutaneous Q24H  . guaiFENesin  1,200 mg Oral BID  . polyethylene glycol  17 g  Oral Daily  . [DISCONTINUED] azithromycin  500 mg Intravenous Q24H   Continuous Infusions:    . sodium chloride 75 mL/hr at 02/04/12 1452    Principal Problem:  *Acute-on-chronic respiratory failure Active Problems:  COPD (chronic obstructive pulmonary disease)  COPD exacerbation  Hypoxemia  Acute sinusitis  ARF (acute renal failure)  Leukocytosis  Hyponatremia    Time spent: 35 minutes    [facility] A  [facility] Pager [phone]. If 8PM-8AM, please contact night-coverage at [email or web address], password [facility] 02/05/2012, 11:19 AM  LOS: 3 days

## 2012-02-05 NOTE — Progress Notes (Signed)
Pt b/p was 166/83 at 2:44. Doctor was notified. Will continue to monitor.

## 2012-02-06 ENCOUNTER — Inpatient Hospital Stay (HOSPITAL_COMMUNITY): Payer: Medicare Other

## 2012-02-06 DIAGNOSIS — R0602 Shortness of breath: Secondary | ICD-10-CM

## 2012-02-06 LAB — BASIC METABOLIC PANEL
BUN: 25 mg/dL — ABNORMAL HIGH (ref 6–23)
CO2: 31 mEq/L (ref 19–32)
Calcium: 9.6 mg/dL (ref 8.4–10.5)
Creatinine, Ser: 1.11 mg/dL — ABNORMAL HIGH (ref 0.50–1.10)
Glucose, Bld: 71 mg/dL (ref 70–99)

## 2012-02-06 LAB — PRO B NATRIURETIC PEPTIDE: Pro B Natriuretic peptide (BNP): 1979 pg/mL — ABNORMAL HIGH (ref 0–125)

## 2012-02-06 MED ORDER — GUAIFENESIN ER 600 MG PO TB12
1200.0000 mg | ORAL_TABLET | Freq: Two times a day (BID) | ORAL | Status: DC
  Administered 2012-02-06 – 2012-02-08 (×4): 1200 mg via ORAL
  Filled 2012-02-06 (×7): qty 2

## 2012-02-06 MED ORDER — DIPHENHYDRAMINE HCL 25 MG PO CAPS
25.0000 mg | ORAL_CAPSULE | Freq: Every day | ORAL | Status: DC
  Administered 2012-02-06 – 2012-02-07 (×2): 25 mg via ORAL
  Filled 2012-02-06 (×4): qty 1

## 2012-02-06 MED ORDER — FLUTICASONE PROPIONATE 50 MCG/ACT NA SUSP
2.0000 | Freq: Every day | NASAL | Status: DC
  Administered 2012-02-06 – 2012-02-08 (×3): 2 via NASAL
  Filled 2012-02-06: qty 16

## 2012-02-06 MED ORDER — FLUCONAZOLE 100 MG PO TABS
100.0000 mg | ORAL_TABLET | Freq: Every day | ORAL | Status: DC
  Administered 2012-02-07 – 2012-02-08 (×2): 100 mg via ORAL
  Filled 2012-02-06 (×2): qty 1

## 2012-02-06 MED ORDER — LABETALOL HCL 5 MG/ML IV SOLN
5.0000 mg | Freq: Once | INTRAVENOUS | Status: AC
  Administered 2012-02-06: 5 mg via INTRAVENOUS
  Filled 2012-02-06: qty 4

## 2012-02-06 MED ORDER — FLUCONAZOLE 200 MG PO TABS
200.0000 mg | ORAL_TABLET | Freq: Once | ORAL | Status: AC
  Administered 2012-02-06: 200 mg via ORAL
  Filled 2012-02-06: qty 1

## 2012-02-06 MED ORDER — MAGIC MOUTHWASH W/LIDOCAINE
5.0000 mL | Freq: Four times a day (QID) | ORAL | Status: DC
  Administered 2012-02-06 – 2012-02-08 (×8): 5 mL via ORAL
  Filled 2012-02-06 (×12): qty 5

## 2012-02-06 MED ORDER — OXYMETAZOLINE HCL 0.05 % NA SOLN
2.0000 | Freq: Two times a day (BID) | NASAL | Status: DC
  Administered 2012-02-06 – 2012-02-08 (×5): 2 via NASAL
  Filled 2012-02-06: qty 15

## 2012-02-06 MED ORDER — LORATADINE 10 MG PO TABS
10.0000 mg | ORAL_TABLET | Freq: Every day | ORAL | Status: DC
  Administered 2012-02-06 – 2012-02-08 (×3): 10 mg via ORAL
  Filled 2012-02-06 (×3): qty 1

## 2012-02-06 MED ORDER — SALINE SPRAY 0.65 % NA SOLN
1.0000 | Freq: Four times a day (QID) | NASAL | Status: DC
  Administered 2012-02-06 – 2012-02-08 (×8): 1 via NASAL
  Filled 2012-02-06: qty 44

## 2012-02-06 MED ORDER — PANTOPRAZOLE SODIUM 40 MG PO TBEC
40.0000 mg | DELAYED_RELEASE_TABLET | Freq: Every day | ORAL | Status: DC
  Administered 2012-02-06 – 2012-02-08 (×3): 40 mg via ORAL
  Filled 2012-02-06 (×2): qty 1

## 2012-02-06 NOTE — Consult Note (Signed)
PULMONARY  / CRITICAL CARE MEDICINE  Name: Shawna [name] MRN: [id number] DOB: [birth year]    LOS: 4  REFERRING PROVIDER:   Jerral Ralph   CHIEF COMPLAINT:  Dyspnea   BRIEF PATIENT DESCRIPTION:  66 year old pt w/ mild obstructive disease by PFTs in march 2013, prior RUL lobectomy in April 2013. Admitted 11/16 for AECOPD and CAP.    CULTURES:   ANTIBIOTICS: azithro 11/16>>> Rocephin 11/16>>>  SIGNIFICANT EVENTS:  CT chest 11/18 1. No pulmonary embolus. 2. Postoperative changes of right upper lobectomy. 3. New peribronchovascular nodularity, ground-glass and airspace consolidation in the right middle lobe and both lower lobes, indicative of bronchopneumonia. Follow-up to clearing is recommended. 4. Tiny left pleural effusion. 5. Coronary artery calcification. Echo 11/20>>>   HISTORY OF PRESENT ILLNESS:   66 y.o. female with a history of O2 dependent COPD (Pre-op PFTs w/ mild obstructive disease in march 2013) and RUL lobectomy in April 2013 for benign lung mass. who presented to the ED on 11/16 with complaints of worsening SOB and wheezing along with sinus and chest congestion for over 2 weeks. She denied fevers and chills she reports greenish nasal drainage. Her PCP placed her on Biaxin 10 days prior but her symptons  worsened. She was admitted to the in-pt setting for failure to respond to out-pt therapy. Diagnostics included: CT chest: this was negative for PE, but did show: ground-glass and airspace consolidation in the right middle lobe and both lower lobes. This was treated for possible CAP. Additional rx has included: systemic steroids, scheduled bronchodilator therapy and supplemental oxygen.  PAST MEDICAL HISTORY :  Past Medical History  Diagnosis Date  . COPD (chronic obstructive pulmonary disease). Pulmonary function testing showed moderate COPD and FEV1 of 1.68 (64% of predicted) which improved to 1.86 with bronchodilators. DLCO is 50%  (05/22/2011)    . HTN (hypertension)   .  B12 deficiency   . OA (osteoarthritis)   . Hypokalemia   . Mass of lung: s/p RUL lobectomy 4/19-->there was no evidence of cancer, pathologist did mention specifically foci of smooth muscle proliferation. No name was given to describe this but was felt would need CT surveillance to watch for reoccurrence     Past Surgical History  Procedure Date  . Carpal tunnel release     bilateral  . Tubal ligation     x 2  . Cervical spine surgery    Prior to Admission medications   Medication Sig Start Date End Date Taking? Authorizing Provider  albuterol (PROVENTIL HFA;VENTOLIN HFA) 108 (90 BASE) MCG/ACT inhaler Inhale 2 puffs into the lungs every 6 (six) hours as needed. For shortness of breath   Yes Historical Provider, MD  albuterol (PROVENTIL) (2.5 MG/3ML) 0.083% nebulizer solution Take 2.5 mg by nebulization every 4 (four) hours as needed. For shortness of breath   Yes Historical Provider, MD  ALPRAZolam (XANAX) 0.25 MG tablet Take 0.25 mg by mouth 2 (two) times daily as needed. For sleep   Yes Historical Provider, MD  Alum & Mag Hydroxide-Simeth (MAGIC MOUTHWASH) SOLN Take 5 mLs by mouth 2 (two) times daily.   Yes Historical Provider, MD  clarithromycin (BIAXIN XL) 500 MG 24 hr tablet Take 1,000 mg by mouth daily.   Yes Historical Provider, MD  cyanocobalamin (,VITAMIN B-12,) 1000 MCG/ML injection Inject 1,000 mcg into the muscle every 30 (thirty) days.   Yes Historical Provider, MD  Fluticasone-Salmeterol (ADVAIR) 500-50 MCG/DOSE AEPB Inhale 2 puffs into the lungs 2 (two) times daily.  Yes Historical Provider, MD  HYDROcodone-homatropine (HYCODAN) 5-1.5 MG/5ML syrup Take 5-10 mLs by mouth every 6 (six) hours as needed. For cough   Yes Historical Provider, MD  ipratropium (ATROVENT) 0.02 % nebulizer solution Take 500 mcg by nebulization every 4 (four) hours.   Yes Historical Provider, MD  KLOR-CON M20 20 MEQ tablet Take 20 mEq by mouth daily.  05/11/11  Yes Historical Provider, MD  meloxicam  (MOBIC) 15 MG tablet Take 15 mg by mouth daily.   Yes Historical Provider, MD  ranitidine (ZANTAC) 150 MG tablet Take 150 mg by mouth 2 (two) times daily.    Yes Historical Provider, MD  verapamil (COVERA HS) 180 MG (CO) 24 hr tablet Take 180 mg by mouth at bedtime.   Yes Historical Provider, MD   Current Meds:  Scheduled Meds:   . albuterol  2.5 mg Nebulization QID   And  . ipratropium  0.5 mg Nebulization QID  . azithromycin  500 mg Oral q1800  . cefTRIAXone (ROCEPHIN)  IV  1 g Intravenous Q24H  . docusate sodium  100 mg Oral BID  . enoxaparin (LOVENOX) injection  30 mg Subcutaneous Q24H  . guaiFENesin  1,200 mg Oral BID  . polyethylene glycol  17 g Oral Daily  . predniSONE  60 mg Oral Q breakfast   Continuous Infusions:   . [DISCONTINUED] sodium chloride 75 mL/hr at 02/04/12 1452   PRN Meds:.acetaminophen, acetaminophen, albuterol, alum & mag hydroxide-simeth, benzonatate, ondansetron, zolpidem Allergies  Allergen Reactions  . Percodan (Oxycodone-Aspirin) Other (See Comments)    Reaction unknown    FAMILY HISTORY:  Family History  Problem Relation Age of Onset  . Coronary artery disease Mother 39  . Alzheimer's disease Mother    SOCIAL HISTORY:  reports that she quit smoking about 8 months ago. She has never used smokeless tobacco. She reports that she does not drink alcohol or use illicit drugs.  REVIEW OF SYSTEMS:   Constitutional: Negative for fever, chills, weight loss,+ malaise/fatigue and diaphoresis.  HENT: Negative for hearing loss, ear pain, nosebleeds, + nasal congestion, + very sore throat, lots of pain w/ swallowing, neck pain, tinnitus and ear discharge.   Eyes: Negative for blurred vision, double vision, photophobia, pain, discharge and redness.  Respiratory: still w/ course non-productive cough, - hemoptysis, some  sputum production, + shortness of breath, + wheezing and stridor.   Cardiovascular: Negative for chest pain, palpitations, orthopnea,  claudication, leg swelling and PND.  Gastrointestinal:++ for heartburn, - nausea, vomiting, no abdominal pain, diarrhea, constipation, blood in stool and melena.  Genitourinary: Negative for dysuria, urgency, frequency, hematuria and flank pain.  Musculoskeletal: Negative for myalgias, back pain, joint pain and falls.  Skin: Negative for itching and rash.  Neurological: Negative for dizziness, tingling, tremors, sensory change, speech change, focal weakness, seizures, loss of consciousness, weakness and headaches.  Endo/Heme/Allergies: Negative for environmental allergies and polydipsia. Does not bruise/bleed easily.  INTERVAL HISTORY:  Still no sig improvement in symptoms  VITAL SIGNS: Temp:  [97.9 F (36.6 C)-98.5 F (36.9 C)] 98 F (36.7 C) (11/19 2052) Pulse Rate:  [94-116] 98  (11/20 0859) Resp:  [18-22] 20  (11/20 0859) BP: (145-165)/(87-106) 145/104 mmHg (11/20 0904) SpO2:  [93 %-100 %] 95 % (11/20 0859)  PHYSICAL EXAMINATION: General:  Pleasant 66 year old female currently in no acute distress but still feels poor Neuro:  Awake, oriented, no focal def HEENT:  Phonation hoarse, blotchy scattered white areas covering posterior pharynx and tongue, post pharynx very erythremic  Cardiovascular:  rrr Lungs:  Scattered rhonchi, exp wheeze  Abdomen:  Soft non-tender Musculoskeletal:  intact Skin:  No edema    Lab 02/06/12 0615 02/05/12 0700 02/04/12 0636  NA 141 137 132*  K 3.8 3.8 4.0  CL 101 101 97  CO2 31 28 24   BUN 25* 30* 25*  CREATININE 1.11* 1.17* 1.46*  GLUCOSE 71 102* 154*    Lab 02/05/12 1146 02/05/12 0700 02/04/12 0636  HGB 12.2 11.6* 11.4*  HCT 36.0 34.4* 33.1*  WBC 13.6* 11.8* 10.4  PLT 230 215 220   Ct Angio Chest Pe W/cm &/or Wo Cm  02/04/2012  *RADIOLOGY REPORT*  Clinical Data: Persistent cough, pain.  Chest congestion.  CT ANGIOGRAPHY CHEST  Technique:  Multidetector CT imaging of the chest using the standard protocol during bolus administration of  intravenous contrast. Multiplanar reconstructed images including MIPs were obtained and reviewed to evaluate the vascular anatomy.  Contrast: OMNIPAQUE IOHEXOL 350 MG/ML SOLN  Comparison: PET 05/22/2011 and CT chest 05/10/2011.  Findings: No pulmonary embolus.  No pathologically enlarged mediastinal, hilar or axillary lymph nodes.  Coronary artery calcification.  Heart size normal.  No pericardial effusion.  Trace left pleural fluid.  Emphysema.  Postoperative changes of right upper lobectomy.  Confluent soft tissue and volume loss in the right middle lobe.  Peribronchovascular nodularity and airspace disease are seen predominantly in the lower lobes, right greater than left.  A 7 mm (9 x 5 mm) subpleural nodule in the left lower lobe (image 34) appears more prominent than on 05/10/2011 (image 31).  Debris seen dependently in the upper trachea.  Incidental imaging of the upper abdomen shows a 6 mm low attenuation lesion in the hepatic dome, too small to characterize. No worrisome lytic or sclerotic lesions.  IMPRESSION:  1.  No pulmonary embolus. 2.  Postoperative changes of right upper lobectomy. 3.  New peribronchovascular nodularity, ground-glass and airspace consolidation in the right middle lobe and both lower lobes, indicative of bronchopneumonia.  Follow-up to clearing is recommended. 4.  Tiny left pleural effusion. 5.  Coronary artery calcification.   Original Report Authenticated By: Leanna Battles, M.D.    Dg Chest Port 1 View  02/06/2012  *RADIOLOGY REPORT*  Clinical Data: Shortness of breath.  Nonproductive cough.  History of asthma.  PORTABLE CHEST - 1 VIEW  Comparison: 02/04/2012 CT and chest x-ray.  Findings: Post right upper lobectomy. Mediastinal shift to the right.  CT detected right middle lobe and bibasilar air space disease not as well appreciated on the present plain film exam.  Calcified tortuous aorta.  Heart size within normal limits.  IMPRESSION: Post right upper lobectomy.  Mediastinal shift to the right.  CT detected right middle lobe and bibasilar air space disease not as well appreciated on the present plain film exam.  Calcified tortuous aorta.   Original Report Authenticated By: Lacy Duverney, M.D.   agree clearing basilar airspace disease  ASSESSMENT / PLAN:  1) CAP vs possible aspiration PNA (favor CAP, but has h/o dysphagia).  Treated in out-pt setting w/ biaxin. Now on CAP coverage. CXR improved.  Rec Cont current abx and pulm hygiene, would probably send her home on oral levaquin to complete 10d rx Will ask SLP to eval.   2) AECOPD in setting of Mild (possibly worse) COPD (last PFTs march 2013): no sig improvement w/ current typical rx of empiric abx, systemic steroids and BD treatment. Suspect that #3 and #4 below are the driving factors for this.  Rec Cont abx Cont BDs Cont systemic steroid taper rx sinusitis and post-nasal gtt rx thrush  She needs to see [place] in out-pt setting. Will need repeat PFTs at some point  3) acute sinusitis w/ persistent post-nasal gtt Rec Nasal saline qid, afrin bid (3 days) and flonase daily Chlorpheniramine or benadryl  q hs/ Claritin during day time  mucinex Extend abx to 10d   4) oral candidiasis/ thrush  Rec Add magic mouth wash and diflucan  5) gastric reflux Rec Add PPI  6) possible dysphagia Rec SLP eval   7) prior RUL lobectomy.   Rec F/u CT scan 6 mo.   Patient seen and examined, agree with above note.  I dictated the care and orders written for this patient under my direction.  Koren Bound, M.D. [phone]

## 2012-02-06 NOTE — Progress Notes (Signed)
  Echocardiogram 2D Echocardiogram has been performed.  Charmain Diosdado 02/06/2012, 1:00 PM

## 2012-02-06 NOTE — Progress Notes (Signed)
02/06/12 0934  Vitals  Temp 98.2 F (36.8 C)  Temp src Oral  BP ! 164/104 mmHg  BP Location Left arm  BP Method Manual  Patient Position, if appropriate Sitting  Pulse Rate 95   Pulse Rate Source Apical  Resp 20   Oxygen Therapy  SpO2 98 %  O2 Device Nasal cannula  O2 Flow Rate (L/min) 2 L/min  Pain Assessment  Pain Assessment No/denies pain  PCA/Epidural/Spinal Assessment  Respiratory Pattern Regular;Unlabored    Dr Jerral Ralph notified, will address.

## 2012-02-06 NOTE — Progress Notes (Signed)
PATIENT DETAILS Name: Shawna [name] Age: 66 y.o. Sex: female Date of Birth: December 28, 1945 Admit Date: 02/02/2012 Admitting Physician Ron Parker, MD WUJ:[id number], CYNTHIA, DO  Subjective: Essentially unchanged from yesterday-feels SOB  Assessment/Plan: Principal Problem:  *Acute-on-chronic respiratory failure -Likely secondary to COPD exacerbation -slow improvement-will consult PCCM -c/w prednisone, bronchodilators, empiric antibiotics -add IS and Fluttter valve -some suspicion for Aspiration-SLP ordered by PCCM, and also SOB worsened by thrush, sinusitis and post nasal gtt-will treat all of these conditions  Bronchopneumonia  -Continue IV antibiotics -transition to levaquin on discharge  Acute sinusitis w/ persistent post-nasal gtt -antibiotics as above -Afrin BID -Antihistamines  oral candidiasis/ thrush  -start Diflucan  GERD -PPI  Acute renal failure -pre-renal -resolved  Leukocytosis -2/2 to PNA and steroids  Hyponatremia -2/2 to dehydration -resolved with Hydration  Recent Lobectomy for Right lung mass (non-malignant) -follow up CT Scan in 6 months  Disposition: Remain inpatient  DVT Prophylaxis: Prophylactic Lovenox   Code Status: Full code   Procedures:  None  CONSULTS:  pulmonary/intensive care  PHYSICAL EXAM: Vital signs in last 24 hours: Filed Vitals:   02/06/12 0618 02/06/12 0859 02/06/12 0904 02/06/12 0934  BP: 162/104  145/104 164/104  Pulse:  98  95  Temp:    98.2 F (36.8 C)  TempSrc:    Oral  Resp:  20  20  Height:      Weight:      SpO2:  95%  98%    Weight change:  Body mass index is 19.63 kg/(m^2).   Gen Exam: Awake and alert with clear speech.  Neck: Supple, No JVD.   Chest: B/L scattered rhonchi.   CVS: S1 S2 Regular, no murmurs.  Abdomen: soft, BS +, non tender, non distended.  Extremities: no edema, lower extremities warm to touch. Neurologic: Non Focal.   Skin: No Rash.   Wounds: N/A.     Intake/Output from previous day:  Intake/Output Summary (Last 24 hours) at 02/06/12 1152 Last data filed at 02/06/12 0937  Gross per 24 hour  Intake 1742.5 ml  Output   3275 ml  Net -1532.5 ml     LAB RESULTS: CBC  Lab 02/05/12 1146 02/05/12 0700 02/04/12 0636 02/03/12 0522 02/02/12 1808  WBC 13.6* 11.8* 10.4 13.6* 23.2*  HGB 12.2 11.6* 11.4* 11.4* 12.7  HCT 36.0 34.4* 33.1* 34.2* 35.9*  PLT 230 215 220 203 235  MCV 85.1 83.3 84.2 84.9 84.9  MCH 28.8 28.1 29.0 28.3 30.0  MCHC 33.9 33.7 34.4 33.3 35.4  RDW 15.8* 15.4 15.5 15.7* 15.8*  LYMPHSABS -- -- -- -- 0.6*  MONOABS -- -- -- -- 1.8*  EOSABS -- -- -- -- 0.1  BASOSABS -- -- -- -- 0.0  BANDABS -- -- -- -- --    Chemistries   Lab 02/06/12 0615 02/05/12 0700 02/04/12 0636 02/03/12 0522 02/02/12 1808  NA 141 137 132* 129* 125*  K 3.8 3.8 4.0 4.4 4.7  CL 101 101 97 93* 87*  CO2 31 28 24 24 25   GLUCOSE 71 102* 154* 205* 102*  BUN 25* 30* 25* 23 27*  CREATININE 1.11* 1.17* 1.46* 1.67* 2.23*  CALCIUM 9.6 9.6 9.5 9.0 9.7  MG -- -- -- -- --    CBG: No results found for this basename: GLUCAP:5 in the last 168 hours  GFR Estimated Creatinine Clearance: 45 ml/min (by C-G formula based on Cr of 1.11).  Coagulation profile No results found for this basename: INR:5,PROTIME:5 in the last 168 hours  Cardiac Enzymes No results found for this basename: CK:3,CKMB:3,TROPONINI:3,MYOGLOBIN:3 in the last 168 hours  No components found with this basename: POCBNP:3 No results found for this basename: DDIMER:2 in the last 72 hours No results found for this basename: HGBA1C:2 in the last 72 hours No results found for this basename: CHOL:2,HDL:2,LDLCALC:2,TRIG:2,CHOLHDL:2,LDLDIRECT:2 in the last 72 hours No results found for this basename: TSH,T4TOTAL,FREET3,T3FREE,THYROIDAB in the last 72 hours No results found for this basename: VITAMINB12:2,FOLATE:2,FERRITIN:2,TIBC:2,IRON:2,RETICCTPCT:2 in the last 72 hours No results found  for this basename: LIPASE:2,AMYLASE:2 in the last 72 hours  Urine Studies No results found for this basename: UACOL:2,UAPR:2,USPG:2,UPH:2,UTP:2,UGL:2,UKET:2,UBIL:2,UHGB:2,UNIT:2,UROB:2,ULEU:2,UEPI:2,UWBC:2,URBC:2,UBAC:2,CAST:2,CRYS:2,UCOM:2,BILUA:2 in the last 72 hours  MICROBIOLOGY: No results found for this or any previous visit (from the past 240 hour(s)).  RADIOLOGY STUDIES/RESULTS: Dg Chest 2 View  02/04/2012  *RADIOLOGY REPORT*  Clinical Data: Short of breath, evaluate pneumonia  CHEST - 2 VIEW  Comparison: Prior chest x-ray 02/02/2012  Findings: Surgical changes of prior right upper lobectomy again noted.  Surgical clips are present in the right suprahilar region and there is juxtaphrenic peaking of the right hemidiaphragm. Similar appearance of residual linear scarring in the right base and lingula.  No new focal airspace opacity, pleural effusion or pneumothorax.  Unchanged pulmonary hyperexpansion suggesting underlying COPD.  Incompletely imaged anterior cervical stabilization hardware at C6-C7.  No acute osseous abnormality. Mild gaseous distension of the colon incidentally noted.  IMPRESSION:  No acute cardiopulmonary disease.  No significant interval change in the appearance of the chest compared to 02/02/2012.   Original Report Authenticated By: Malachy Moan, M.D.    Dg Chest 2 View  02/02/2012  *RADIOLOGY REPORT*  Clinical Data: Shortness of breath.  CHEST - 2 VIEW  Comparison: 11/30/2011  Findings: Prior right upper lobectomy.  Stable scarring throughout both lung fields and elevation of right lung base.  No active infiltrates or failure.  Normal heart size.  Calcified tortuous aorta.  Prior cervical fusion.  IMPRESSION: Stable chest.   Original Report Authenticated By: Davonna Belling, M.D.    Ct Angio Chest Pe W/cm &/or Wo Cm  02/04/2012  *RADIOLOGY REPORT*  Clinical Data: Persistent cough, pain.  Chest congestion.  CT ANGIOGRAPHY CHEST  Technique:  Multidetector CT imaging of  the chest using the standard protocol during bolus administration of intravenous contrast. Multiplanar reconstructed images including MIPs were obtained and reviewed to evaluate the vascular anatomy.  Contrast: OMNIPAQUE IOHEXOL 350 MG/ML SOLN  Comparison: PET 05/22/2011 and CT chest 05/10/2011.  Findings: No pulmonary embolus.  No pathologically enlarged mediastinal, hilar or axillary lymph nodes.  Coronary artery calcification.  Heart size normal.  No pericardial effusion.  Trace left pleural fluid.  Emphysema.  Postoperative changes of right upper lobectomy.  Confluent soft tissue and volume loss in the right middle lobe.  Peribronchovascular nodularity and airspace disease are seen predominantly in the lower lobes, right greater than left.  A 7 mm (9 x 5 mm) subpleural nodule in the left lower lobe (image 34) appears more prominent than on 05/10/2011 (image 31).  Debris seen dependently in the upper trachea.  Incidental imaging of the upper abdomen shows a 6 mm low attenuation lesion in the hepatic dome, too small to characterize. No worrisome lytic or sclerotic lesions.  IMPRESSION:  1.  No pulmonary embolus. 2.  Postoperative changes of right upper lobectomy. 3.  New peribronchovascular nodularity, ground-glass and airspace consolidation in the right middle lobe and both lower lobes, indicative of bronchopneumonia.  Follow-up to clearing is recommended. 4.  Tiny  left pleural effusion. 5.  Coronary artery calcification.   Original Report Authenticated By: Leanna Battles, M.D.    Dg Chest Port 1 View  02/06/2012  *RADIOLOGY REPORT*  Clinical Data: Shortness of breath.  Nonproductive cough.  History of asthma.  PORTABLE CHEST - 1 VIEW  Comparison: 02/04/2012 CT and chest x-ray.  Findings: Post right upper lobectomy. Mediastinal shift to the right.  CT detected right middle lobe and bibasilar air space disease not as well appreciated on the present plain film exam.  Calcified tortuous aorta.  Heart size  within normal limits.  IMPRESSION: Post right upper lobectomy. Mediastinal shift to the right.  CT detected right middle lobe and bibasilar air space disease not as well appreciated on the present plain film exam.  Calcified tortuous aorta.   Original Report Authenticated By: Lacy Duverney, M.D.     MEDICATIONS: Scheduled Meds:   . albuterol  2.5 mg Nebulization QID   And  . ipratropium  0.5 mg Nebulization QID  . azithromycin  500 mg Oral q1800  . cefTRIAXone (ROCEPHIN)  IV  1 g Intravenous Q24H  . diphenhydrAMINE  25 mg Oral QHS  . docusate sodium  100 mg Oral BID  . enoxaparin (LOVENOX) injection  30 mg Subcutaneous Q24H  . fluconazole  100 mg Oral Daily  . [COMPLETED] fluconazole  200 mg Oral Once  . fluticasone  2 spray Each Nare Daily  . guaiFENesin  1,200 mg Oral BID  . guaiFENesin  1,200 mg Oral BID  . [COMPLETED] labetalol  5 mg Intravenous Once  . loratadine  10 mg Oral Daily  . magic mouthwash w/lidocaine  5 mL Oral QID  . oxymetazoline  2 spray Each Nare BID  . pantoprazole  40 mg Oral Q1200  . polyethylene glycol  17 g Oral Daily  . predniSONE  60 mg Oral Q breakfast  . sodium chloride  1 spray Each Nare QID  . [DISCONTINUED] albuterol  2.5 mg Nebulization Q4H  . [DISCONTINUED] albuterol  2.5 mg Nebulization Q4H  . [DISCONTINUED] ipratropium  0.5 mg Nebulization Q4H  . [DISCONTINUED] ipratropium  0.5 mg Nebulization QID   Continuous Infusions:   . [DISCONTINUED] sodium chloride 75 mL/hr at 02/04/12 1452   PRN Meds:.acetaminophen, acetaminophen, albuterol, alum & mag hydroxide-simeth, benzonatate, ondansetron, zolpidem  Antibiotics: Anti-infectives     Start     Dose/Rate Route Frequency Ordered Stop   02/07/12 1000   fluconazole (DIFLUCAN) tablet 100 mg        100 mg Oral Daily 02/06/12 1035     02/06/12 1145   fluconazole (DIFLUCAN) tablet 200 mg        200 mg Oral  Once 02/06/12 1035 02/06/12 1126   02/04/12 1800   azithromycin (ZITHROMAX) tablet 500 mg          500 mg Oral Daily-1800 02/04/12 1413     02/03/12 2000   azithromycin (ZITHROMAX) 500 mg in dextrose 5 % 250 mL IVPB  Status:  Discontinued        500 mg 250 mL/hr over 60 Minutes Intravenous Every 24 hours 02/02/12 2046 02/04/12 1413   02/03/12 1800   cefTRIAXone (ROCEPHIN) 1 g in dextrose 5 % 50 mL IVPB        1 g 100 mL/hr over 30 Minutes Intravenous Every 24 hours 02/02/12 2046     02/02/12 1930   cefTRIAXone (ROCEPHIN) 1 g in dextrose 5 % 50 mL IVPB        1  g 100 mL/hr over 30 Minutes Intravenous  Once 02/02/12 1928 02/02/12 2051   02/02/12 1930   azithromycin (ZITHROMAX) 500 mg in dextrose 5 % 250 mL IVPB        500 mg 250 mL/hr over 60 Minutes Intravenous  Once 02/02/12 1928 02/02/12 2146           Jeoffrey Massed, MD  [facility] Pager:[phone]  If 7PM-7AM, please contact night-coverage [email or web address] Password [email or web address] 02/06/2012, 11:52 AM   LOS: 4 days

## 2012-02-06 NOTE — Evaluation (Signed)
Clinical/Bedside Swallow Evaluation Patient Details  Name: Shawna [name] MRN: [id number] Date of Birth: February 10, 1946  Today's Date: 02/06/2012 Time: 1430-1500 SLP Time Calculation (min): 30 min  Past Medical History:  Past Medical History  Diagnosis Date  . COPD (chronic obstructive pulmonary disease)   . HTN (hypertension)   . B12 deficiency   . OA (osteoarthritis)   . Hypokalemia   . Mass of lung    Past Surgical History:  Past Surgical History  Procedure Date  . Carpal tunnel release     bilateral  . Tubal ligation     x 2  . Cervical spine surgery    HPI:  66 y.o. female with a history of O2 dependent COPD (Pre-op PFTs w/ mild obstructive disease in march 2013) and RUL lobectomy in April 2013 for benign lung mass. who presented to the ED on 11/16 with complaints of worsening SOB and wheezing along with sinus and chest congestion for over 2 weeks. She denied fevers and chills she reports greenish nasal drainage. Her PCP placed her on Biaxin 10 days prior but her symptons  worsened. She was admitted to the in-pt setting for failure to respond to out-pt therapy. Diagnostics included: CT chest: this was negative for PE, but did show: ground-glass and airspace consolidation in the right middle lobe and both lower lobes. This was treated for possible CAP. Additional rx has included: systemic steroids, scheduled bronchodilator therapy and supplemental oxygen. Lungs:  Scattered rhonchi, exp wheeze; candidiasis - being treated with Magic mouthwash; odynophagia.       Assessment / Plan / Recommendation Clinical Impression  Pt presents with functional oropharyngeal swallow upon clinical exam.  Cranial nerve function WNL.  Respiratory rate does not appear to interfere with swallow biomechanics.  In addition, pt observed with normal exhale-swallow-exhale pattern, which is sometimes compromised in pts with AECOPD.  Behaviors during PO consumption suggest adequate airway protection with all  consistencies.   Recommend continuing current regular diet with thin liquids.  Reviewed results/recs with pt, who is in agreement.  No SLP f/u warranted.             Diet Recommendation Regular;Thin liquid   Liquid Administration via: Cup;Straw Medication Administration: Whole meds with liquid Supervision: Patient able to self feed Compensations:  (exhale post-swallow) Postural Changes and/or Swallow Maneuvers: Seated upright 90 degrees    Other  Recommendations Oral Care Recommendations: Oral care BID   Follow Up Recommendations  None    Betzy Barbier L. Samson Frederic, MA CCC/SLP Pager [phone]   Blenda Mounts Laurice 02/06/2012,3:18 PM

## 2012-02-07 MED ORDER — VERAPAMIL HCL 180 MG (CO) PO TB24: 180.0000 mg | ORAL_TABLET | Freq: Every day | ORAL | Status: DC

## 2012-02-07 MED ORDER — PREDNISONE 50 MG PO TABS
50.0000 mg | ORAL_TABLET | Freq: Every day | ORAL | Status: DC
  Administered 2012-02-08: 50 mg via ORAL
  Filled 2012-02-07 (×2): qty 1

## 2012-02-07 MED ORDER — VERAPAMIL HCL ER 180 MG PO TBCR
180.0000 mg | EXTENDED_RELEASE_TABLET | Freq: Every day | ORAL | Status: DC
  Administered 2012-02-07: 180 mg via ORAL
  Filled 2012-02-07 (×2): qty 1

## 2012-02-07 NOTE — Progress Notes (Signed)
PATIENT DETAILS Name: Shawna [name] Age: 66 y.o. Sex: female Date of Birth: 18-Aug-1945 Admit Date: 02/02/2012 Admitting Physician Ron Parker, MD ZOX:WRUEAV, CYNTHIA, DO  Subjective: Feels better-"when can I go home"  Assessment/Plan: Principal Problem:  *Acute-on-chronic respiratory failure -Likely secondary to COPD exacerbation -slow improvement-will consult PCCM -c/w prednisone-but taper, bronchodilators, empiric antibiotics -add IS and Fluttter valve -SOB worsened by thrush, sinusitis and post nasal gtt-will treat all of these conditions -SLP eval done 11/20-no aspiration  Bronchopneumonia  -Continue IV antibiotics -transition to levaquin on discharge  Acute sinusitis w/ persistent post-nasal gtt -antibiotics as above -Afrin BID -Antihistamines  oral candidiasis/ thrush  -on Diflucan since 11/20 -plan on tx 14 days  GERD -PPI  Acute renal failure -pre-renal -resolved  Leukocytosis -2/2 to PNA and steroids  Hyponatremia -2/2 to dehydration -resolved with Hydration  Recent Lobectomy for Right lung mass (non-malignant) -follow up CT Scan in 6 months  Disposition: Remain inpatient-D/C 11/22  DVT Prophylaxis: Prophylactic Lovenox   Code Status: Full code   Procedures:  None  CONSULTS:  pulmonary/intensive care  PHYSICAL EXAM: Vital signs in last 24 hours: Filed Vitals:   02/07/12 0128 02/07/12 0415 02/07/12 0836 02/07/12 0945  BP: 146/97 143/91  160/96  Pulse: 92 89  112  Temp: 98.1 F (36.7 C) 98.3 F (36.8 C)  97.6 F (36.4 C)  TempSrc: Oral Oral  Oral  Resp: 18 18  20   Height:      Weight:      SpO2: 97% 98% 96% 97%    Weight change:  Body mass index is 19.63 kg/(m^2).   Gen Exam: Awake and alert with clear speech.  Neck: Supple, No JVD.   Chest: B/L scattered rhonchi.  But good air entry CVS: S1 S2 Regular, no murmurs.  Abdomen: soft, BS +, non tender, non distended.  Extremities: no edema, lower extremities warm  to touch. Neurologic: Non Focal.   Skin: No Rash.   Wounds: N/A.    Intake/Output from previous day:  Intake/Output Summary (Last 24 hours) at 02/07/12 1024 Last data filed at 02/06/12 2200  Gross per 24 hour  Intake    100 ml  Output      0 ml  Net    100 ml     LAB RESULTS: CBC  Lab 02/05/12 1146 02/05/12 0700 02/04/12 0636 02/03/12 0522 02/02/12 1808  WBC 13.6* 11.8* 10.4 13.6* 23.2*  HGB 12.2 11.6* 11.4* 11.4* 12.7  HCT 36.0 34.4* 33.1* 34.2* 35.9*  PLT 230 215 220 203 235  MCV 85.1 83.3 84.2 84.9 84.9  MCH 28.8 28.1 29.0 28.3 30.0  MCHC 33.9 33.7 34.4 33.3 35.4  RDW 15.8* 15.4 15.5 15.7* 15.8*  LYMPHSABS -- -- -- -- 0.6*  MONOABS -- -- -- -- 1.8*  EOSABS -- -- -- -- 0.1  BASOSABS -- -- -- -- 0.0  BANDABS -- -- -- -- --    Chemistries   Lab 02/06/12 0615 02/05/12 0700 02/04/12 0636 02/03/12 0522 02/02/12 1808  NA 141 137 132* 129* 125*  K 3.8 3.8 4.0 4.4 4.7  CL 101 101 97 93* 87*  CO2 31 28 24 24 25   GLUCOSE 71 102* 154* 205* 102*  BUN 25* 30* 25* 23 27*  CREATININE 1.11* 1.17* 1.46* 1.67* 2.23*  CALCIUM 9.6 9.6 9.5 9.0 9.7  MG -- -- -- -- --    CBG: No results found for this basename: GLUCAP:5 in the last 168 hours  GFR Estimated Creatinine Clearance: 45  ml/min (by C-G formula based on Cr of 1.11).  Coagulation profile No results found for this basename: INR:5,PROTIME:5 in the last 168 hours  Cardiac Enzymes No results found for this basename: CK:3,CKMB:3,TROPONINI:3,MYOGLOBIN:3 in the last 168 hours  No components found with this basename: POCBNP:3 No results found for this basename: DDIMER:2 in the last 72 hours No results found for this basename: HGBA1C:2 in the last 72 hours No results found for this basename: CHOL:2,HDL:2,LDLCALC:2,TRIG:2,CHOLHDL:2,LDLDIRECT:2 in the last 72 hours No results found for this basename: TSH,T4TOTAL,FREET3,T3FREE,THYROIDAB in the last 72 hours No results found for this basename:  VITAMINB12:2,FOLATE:2,FERRITIN:2,TIBC:2,IRON:2,RETICCTPCT:2 in the last 72 hours No results found for this basename: LIPASE:2,AMYLASE:2 in the last 72 hours  Urine Studies No results found for this basename: UACOL:2,UAPR:2,USPG:2,UPH:2,UTP:2,UGL:2,UKET:2,UBIL:2,UHGB:2,UNIT:2,UROB:2,ULEU:2,UEPI:2,UWBC:2,URBC:2,UBAC:2,CAST:2,CRYS:2,UCOM:2,BILUA:2 in the last 72 hours  MICROBIOLOGY: No results found for this or any previous visit (from the past 240 hour(s)).  RADIOLOGY STUDIES/RESULTS: Dg Chest 2 View  02/04/2012  *RADIOLOGY REPORT*  Clinical Data: Short of breath, evaluate pneumonia  CHEST - 2 VIEW  Comparison: Prior chest x-ray 02/02/2012  Findings: Surgical changes of prior right upper lobectomy again noted.  Surgical clips are present in the right suprahilar region and there is juxtaphrenic peaking of the right hemidiaphragm. Similar appearance of residual linear scarring in the right base and lingula.  No new focal airspace opacity, pleural effusion or pneumothorax.  Unchanged pulmonary hyperexpansion suggesting underlying COPD.  Incompletely imaged anterior cervical stabilization hardware at C6-C7.  No acute osseous abnormality. Mild gaseous distension of the colon incidentally noted.  IMPRESSION:  No acute cardiopulmonary disease.  No significant interval change in the appearance of the chest compared to 02/02/2012.   Original Report Authenticated By: Malachy Moan, M.D.    Dg Chest 2 View  02/02/2012  *RADIOLOGY REPORT*  Clinical Data: Shortness of breath.  CHEST - 2 VIEW  Comparison: 11/30/2011  Findings: Prior right upper lobectomy.  Stable scarring throughout both lung fields and elevation of right lung base.  No active infiltrates or failure.  Normal heart size.  Calcified tortuous aorta.  Prior cervical fusion.  IMPRESSION: Stable chest.   Original Report Authenticated By: Davonna Belling, M.D.    Ct Angio Chest Pe W/cm &/or Wo Cm  02/04/2012  *RADIOLOGY REPORT*  Clinical Data:  Persistent cough, pain.  Chest congestion.  CT ANGIOGRAPHY CHEST  Technique:  Multidetector CT imaging of the chest using the standard protocol during bolus administration of intravenous contrast. Multiplanar reconstructed images including MIPs were obtained and reviewed to evaluate the vascular anatomy.  Contrast: OMNIPAQUE IOHEXOL 350 MG/ML SOLN  Comparison: PET 05/22/2011 and CT chest 05/10/2011.  Findings: No pulmonary embolus.  No pathologically enlarged mediastinal, hilar or axillary lymph nodes.  Coronary artery calcification.  Heart size normal.  No pericardial effusion.  Trace left pleural fluid.  Emphysema.  Postoperative changes of right upper lobectomy.  Confluent soft tissue and volume loss in the right middle lobe.  Peribronchovascular nodularity and airspace disease are seen predominantly in the lower lobes, right greater than left.  A 7 mm (9 x 5 mm) subpleural nodule in the left lower lobe (image 34) appears more prominent than on 05/10/2011 (image 31).  Debris seen dependently in the upper trachea.  Incidental imaging of the upper abdomen shows a 6 mm low attenuation lesion in the hepatic dome, too small to characterize. No worrisome lytic or sclerotic lesions.  IMPRESSION:  1.  No pulmonary embolus. 2.  Postoperative changes of right upper lobectomy. 3.  New peribronchovascular nodularity,  ground-glass and airspace consolidation in the right middle lobe and both lower lobes, indicative of bronchopneumonia.  Follow-up to clearing is recommended. 4.  Tiny left pleural effusion. 5.  Coronary artery calcification.   Original Report Authenticated By: Leanna Battles, M.D.    Dg Chest Port 1 View  02/06/2012  *RADIOLOGY REPORT*  Clinical Data: Shortness of breath.  Nonproductive cough.  History of asthma.  PORTABLE CHEST - 1 VIEW  Comparison: 02/04/2012 CT and chest x-ray.  Findings: Post right upper lobectomy. Mediastinal shift to the right.  CT detected right middle lobe and bibasilar air  space disease not as well appreciated on the present plain film exam.  Calcified tortuous aorta.  Heart size within normal limits.  IMPRESSION: Post right upper lobectomy. Mediastinal shift to the right.  CT detected right middle lobe and bibasilar air space disease not as well appreciated on the present plain film exam.  Calcified tortuous aorta.   Original Report Authenticated By: Lacy Duverney, M.D.     MEDICATIONS: Scheduled Meds:    . albuterol  2.5 mg Nebulization QID   And  . ipratropium  0.5 mg Nebulization QID  . azithromycin  500 mg Oral q1800  . cefTRIAXone (ROCEPHIN)  IV  1 g Intravenous Q24H  . diphenhydrAMINE  25 mg Oral QHS  . docusate sodium  100 mg Oral BID  . enoxaparin (LOVENOX) injection  30 mg Subcutaneous Q24H  . fluconazole  100 mg Oral Daily  . [COMPLETED] fluconazole  200 mg Oral Once  . fluticasone  2 spray Each Nare Daily  . guaiFENesin  1,200 mg Oral BID  . guaiFENesin  1,200 mg Oral BID  . loratadine  10 mg Oral Daily  . magic mouthwash w/lidocaine  5 mL Oral QID  . oxymetazoline  2 spray Each Nare BID  . pantoprazole  40 mg Oral Q1200  . polyethylene glycol  17 g Oral Daily  . predniSONE  60 mg Oral Q breakfast  . sodium chloride  1 spray Each Nare QID   Continuous Infusions:  PRN Meds:.acetaminophen, acetaminophen, albuterol, alum & mag hydroxide-simeth, benzonatate, ondansetron, zolpidem  Antibiotics: Anti-infectives     Start     Dose/Rate Route Frequency Ordered Stop   02/07/12 1000   fluconazole (DIFLUCAN) tablet 100 mg        100 mg Oral Daily 02/06/12 1035     02/06/12 1145   fluconazole (DIFLUCAN) tablet 200 mg        200 mg Oral  Once 02/06/12 1035 02/06/12 1126   02/04/12 1800   azithromycin (ZITHROMAX) tablet 500 mg        500 mg Oral Daily-1800 02/04/12 1413     02/03/12 2000   azithromycin (ZITHROMAX) 500 mg in dextrose 5 % 250 mL IVPB  Status:  Discontinued        500 mg 250 mL/hr over 60 Minutes Intravenous Every 24 hours  02/02/12 2046 02/04/12 1413   02/03/12 1800   cefTRIAXone (ROCEPHIN) 1 g in dextrose 5 % 50 mL IVPB        1 g 100 mL/hr over 30 Minutes Intravenous Every 24 hours 02/02/12 2046     02/02/12 1930   cefTRIAXone (ROCEPHIN) 1 g in dextrose 5 % 50 mL IVPB        1 g 100 mL/hr over 30 Minutes Intravenous  Once 02/02/12 1928 02/02/12 2051   02/02/12 1930   azithromycin (ZITHROMAX) 500 mg in dextrose 5 % 250 mL IVPB  500 mg 250 mL/hr over 60 Minutes Intravenous  Once 02/02/12 1928 02/02/12 2146           Jeoffrey Massed, MD  [facility] Pager:[phone]  If 7PM-7AM, please contact night-coverage [email or web address] Password [email or web address] 02/07/2012, 10:24 AM   LOS: 5 days

## 2012-02-07 NOTE — Progress Notes (Addendum)
PULMONARY  / CRITICAL CARE MEDICINE  Name: Shawna [name] MRN: [id number] DOB: 01-02-1946    LOS: 5  REFERRING PROVIDER:   Jerral Ralph   CHIEF COMPLAINT:  Dyspnea   BRIEF PATIENT DESCRIPTION:  66 year old pt w/ mild obstructive disease by PFTs in march 2013, prior RUL lobectomy in April 2013. Admitted 11/16 for AECOPD and CAP.  She has been on home O2 x 3 months after hospitalisation   CULTURES:   ANTIBIOTICS: azithro 11/16>>> Rocephin 11/16>>>  SIGNIFICANT EVENTS:  CT chest 11/18 1. No pulmonary embolus. 2. Postoperative changes of right upper lobectomy. 3. New peribronchovascular nodularity, ground-glass and airspace consolidation in the right middle lobe and both lower lobes, indicative of bronchopneumonia. Follow-up to clearing is recommended. 4. Tiny left pleural effusion. 5. Coronary artery calcification. Echo 11/20>>>Left ventricle: The cavity size was normal. Wall thickness was normal. Systolic function was normal. The estimated ejection fraction was in the range of 55% to 60%. There was an increased relative contribution of atrial contraction to ventricular filling.    INTERVAL HISTORY:  Feels better.   VITAL SIGNS: Temp:  [98.1 F (36.7 C)-98.3 F (36.8 C)] 98.3 F (36.8 C) (11/21 0415) Pulse Rate:  [82-103] 89  (11/21 0415) Resp:  [18-22] 18  (11/21 0415) BP: (140-164)/(91-104) 143/91 mmHg (11/21 0415) SpO2:  [93 %-98 %] 96 % (11/21 0836)  PHYSICAL EXAMINATION: General:  Pleasant 66 year old female currently in no acute distress but still feels poor Neuro:  Awake, oriented, no focal def HEENT:  Phonation hoarse,post pharynx very erythremic  Cardiovascular:  rrr Lungs:  Scattered rhonchi, exp wheeze improved Abdomen:  Soft non-tender Musculoskeletal:  intact Skin:  No edema    Lab 02/06/12 0615 02/05/12 0700 02/04/12 0636  NA 141 137 132*  K 3.8 3.8 4.0  CL 101 101 97  CO2 31 28 24   BUN 25* 30* 25*  CREATININE 1.11* 1.17* 1.46*  GLUCOSE 71 102* 154*      Lab 02/05/12 1146 02/05/12 0700 02/04/12 0636  HGB 12.2 11.6* 11.4*  HCT 36.0 34.4* 33.1*  WBC 13.6* 11.8* 10.4  PLT 230 215 220   Dg Chest Port 1 View  02/06/2012  *RADIOLOGY REPORT*  Clinical Data: Shortness of breath.  Nonproductive cough.  History of asthma.  PORTABLE CHEST - 1 VIEW  Comparison: 02/04/2012 CT and chest x-ray.  Findings: Post right upper lobectomy. Mediastinal shift to the right.  CT detected right middle lobe and bibasilar air space disease not as well appreciated on the present plain film exam.  Calcified tortuous aorta.  Heart size within normal limits.  IMPRESSION: Post right upper lobectomy. Mediastinal shift to the right.  CT detected right middle lobe and bibasilar air space disease not as well appreciated on the present plain film exam.  Calcified tortuous aorta.   Original Report Authenticated By: Lacy Duverney, M.D.   agree clearing basilar airspace disease  ASSESSMENT / PLAN:  1) CAP rather than aspiration PNA (favor CAP, but has h/o dysphagia).  Treated in out-pt setting w/ biaxin. Now on CAP coverage. CXR improved.  Rec Cont current abx and pulm hygiene, would probably send her home on oral levaquin to complete 10d rx  SLP-eval neg.   2) AECOPD in setting of Mild (possibly worse) COPD (last PFTs march 2013): no sig improvement w/ current typical rx of empiric abx, systemic steroids and BD treatment. Suspect that #3 and #4 below are the driving factors for this.  Rec Cont abx (would switch to  oral) Cont BDs Cont systemic steroid taper over one week rx sinusitis and post-nasal gtt rx thrush  She needs to see [place] in out-pt setting. Will need repeat PFTs at some point, she has home O2 set up already  3) acute sinusitis w/ persistent post-nasal gtt Rec Nasal saline qid, afrin bid (day 2 of 3) and flonase daily (would continue saline and flonase until she completes her ABX course), then continue flonase 1 spray each nostril daily.  Chlorpheniramine or  benadryl  q hs/ Claritin during day time, would continue until completes abx course  mucinex   4) oral candidiasis/ thrush  Rec Added magic mouth wash and diflucan (day 2 of 5 for both)   5) gastric reflux Rec Added PPI (would send home on generic omeprazole)  6) prior RUL lobectomy.   Rec F/u CT scan 6 mo.   PCCM to sign off  ALVA,RAKESH V.

## 2012-02-07 NOTE — Plan of Care (Signed)
Problem: Phase III Progression Outcomes Goal: O2 sats > or equal to 93% on room air Outcome: Not Applicable Date Met:  02/07/12 Pt is O2 dependent on 3L

## 2012-02-07 NOTE — Progress Notes (Signed)
[facility]  Patient Status: New  [facility] is providing the following services: RN  If patient discharges after hours, please call [phone].   Shawna [name] 02/07/2012, 1:18 PM

## 2012-02-08 MED ORDER — PREDNISONE 10 MG PO TABS: ORAL_TABLET | ORAL | Status: DC

## 2012-02-08 MED ORDER — GUAIFENESIN ER 600 MG PO TB12: 1200.0000 mg | ORAL_TABLET | Freq: Two times a day (BID) | ORAL | Status: DC

## 2012-02-08 MED ORDER — IPRATROPIUM BROMIDE 0.02 % IN SOLN: 500.0000 ug | RESPIRATORY_TRACT | Status: DC | PRN

## 2012-02-08 MED ORDER — LEVOFLOXACIN 750 MG PO TABS: 750.0000 mg | ORAL_TABLET | Freq: Every day | ORAL | Status: DC

## 2012-02-08 MED ORDER — ALBUTEROL SULFATE (2.5 MG/3ML) 0.083% IN NEBU: 2.5000 mg | INHALATION_SOLUTION | RESPIRATORY_TRACT | Status: DC | PRN

## 2012-02-08 MED ORDER — FLUCONAZOLE 100 MG PO TABS: 100.0000 mg | ORAL_TABLET | Freq: Every day | ORAL | Status: DC

## 2012-02-08 MED ORDER — FLUTICASONE PROPIONATE 50 MCG/ACT NA SUSP: 1.0000 | Freq: Every day | NASAL | Status: DC

## 2012-02-08 MED ORDER — LORATADINE 10 MG PO TABS: 10.0000 mg | ORAL_TABLET | Freq: Every day | ORAL | Status: DC

## 2012-02-08 MED ORDER — TIOTROPIUM BROMIDE MONOHYDRATE 18 MCG IN CAPS: 18.0000 ug | ORAL_CAPSULE | Freq: Every day | RESPIRATORY_TRACT | Status: DC

## 2012-02-08 MED ORDER — OMEPRAZOLE 40 MG PO CPDR: 40.0000 mg | DELAYED_RELEASE_CAPSULE | Freq: Every day | ORAL | Status: DC

## 2012-02-08 MED ORDER — SALINE SPRAY 0.65 % NA SOLN: 1.0000 | Freq: Four times a day (QID) | NASAL | Status: DC

## 2012-02-08 MED ORDER — ALBUTEROL SULFATE HFA 108 (90 BASE) MCG/ACT IN AERS: 2.0000 | INHALATION_SPRAY | RESPIRATORY_TRACT | Status: DC | PRN

## 2012-02-08 MED ORDER — MAGIC MOUTHWASH W/LIDOCAINE: 5.0000 mL | Freq: Four times a day (QID) | ORAL | Status: DC

## 2012-02-08 NOTE — Progress Notes (Signed)
02/08/12 Patient  To be discharged today home. Discharge instruction reviewed with patient. IV site removed.

## 2012-02-08 NOTE — Discharge Summary (Signed)
PATIENT DETAILS Name: Shawna [name] Age: 66 y.o. Sex: female Date of Birth: [birth year] MRN: [id number]. Admit Date: 02/02/2012 Admitting Physician: Ron Parker, MD WUJ:[id number], CYNTHIA, DO  Recommendations for Outpatient Follow-up:  1. Optimizie COPD medications 2. Repeat PFT's and CT Chest in around 6 months  PRIMARY DISCHARGE DIAGNOSIS:  Principal Problem:  *Acute-on-chronic respiratory failure Active Problems:  COPD (chronic obstructive pulmonary disease)  COPD exacerbation  Hypoxemia  Acute sinusitis  ARF (acute renal failure)  Leukocytosis  Hyponatremia      PAST MEDICAL HISTORY: Past Medical History  Diagnosis Date  . COPD (chronic obstructive pulmonary disease)   . HTN (hypertension)   . B12 deficiency   . OA (osteoarthritis)   . Hypokalemia   . Mass of lung     DISCHARGE MEDICATIONS:   Medication List     As of 02/08/2012  8:55 AM    STOP taking these medications         clarithromycin 500 MG 24 hr tablet   Commonly known as: BIAXIN XL      HYDROcodone-homatropine 5-1.5 MG/5ML syrup   Commonly known as: HYCODAN      magic mouthwash Soln      ranitidine 150 MG tablet   Commonly known as: ZANTAC      TAKE these medications         albuterol 108 (90 BASE) MCG/ACT inhaler   Commonly known as: PROVENTIL HFA;VENTOLIN HFA   Inhale 2 puffs into the lungs every 4 (four) hours as needed. For shortness of breath      albuterol (2.5 MG/3ML) 0.083% nebulizer solution   Commonly known as: PROVENTIL   Take 3 mLs (2.5 mg total) by nebulization every 2 (two) hours as needed. For shortness of breath      ALPRAZolam 0.25 MG tablet   Commonly known as: XANAX   Take 0.25 mg by mouth 2 (two) times daily as needed. For sleep      cyanocobalamin 1000 MCG/ML injection   Commonly known as: (VITAMIN B-12)   Inject 1,000 mcg into the muscle every 30 (thirty) days.      fluconazole 100 MG tablet   Commonly known as: DIFLUCAN   Take 1 tablet (100 mg  total) by mouth daily.      fluticasone 50 MCG/ACT nasal spray   Commonly known as: FLONASE   Place 1 spray into the nose daily.      Fluticasone-Salmeterol 500-50 MCG/DOSE Aepb   Commonly known as: ADVAIR   Inhale 2 puffs into the lungs 2 (two) times daily.      guaiFENesin 600 MG 12 hr tablet   Commonly known as: MUCINEX   Take 2 tablets (1,200 mg total) by mouth 2 (two) times daily.      ipratropium 0.02 % nebulizer solution   Commonly known as: ATROVENT   Take 2.5 mLs (500 mcg total) by nebulization every 4 (four) hours as needed for wheezing.      KLOR-CON M20 20 MEQ tablet   Generic drug: potassium chloride SA   Take 20 mEq by mouth daily.      levofloxacin 750 MG tablet   Commonly known as: LEVAQUIN   Take 1 tablet (750 mg total) by mouth daily.      loratadine 10 MG tablet   Commonly known as: CLARITIN   Take 1 tablet (10 mg total) by mouth daily.      magic mouthwash w/lidocaine Soln   Take 5 mLs by mouth 4 (four) times  daily.      meloxicam 15 MG tablet   Commonly known as: MOBIC   Take 15 mg by mouth daily.      omeprazole 40 MG capsule   Commonly known as: PRILOSEC   Take 1 capsule (40 mg total) by mouth daily.      predniSONE 10 MG tablet   Commonly known as: DELTASONE   Take 4 tablets daily for 3 days, then,   Take 3 tablets daily for 3 days, then,   Take 2 tablets daily for 3 days, then,   Take 1 tablet daily for 1 day and then stop      sodium chloride 0.65 % Soln nasal spray   Commonly known as: OCEAN   Place 1 spray into the nose 4 (four) times daily.      tiotropium 18 MCG inhalation capsule   Commonly known as: SPIRIVA   Place 1 capsule (18 mcg total) into inhaler and inhale daily.      verapamil 180 MG (CO) 24 hr tablet   Commonly known as: COVERA HS   Take 180 mg by mouth at bedtime.         BRIEF HPI:  See H&P, Labs, Consult and Test reports for all details in brief, LUCELLA POMMIER is a 66 y.o. female with a history of O2  dependent COPD who presents tot he ED with complaints of worsening SOB and wheezing along with sinus and chest congestion for over 2 weeks.   CONSULTATIONS:   PCCM  PERTINENT RADIOLOGIC STUDIES: Dg Chest 2 View  02/04/2012  *RADIOLOGY REPORT*  Clinical Data: Short of breath, evaluate pneumonia  CHEST - 2 VIEW  Comparison: Prior chest x-ray 02/02/2012  Findings: Surgical changes of prior right upper lobectomy again noted.  Surgical clips are present in the right suprahilar region and there is juxtaphrenic peaking of the right hemidiaphragm. Similar appearance of residual linear scarring in the right base and lingula.  No new focal airspace opacity, pleural effusion or pneumothorax.  Unchanged pulmonary hyperexpansion suggesting underlying COPD.  Incompletely imaged anterior cervical stabilization hardware at C6-C7.  No acute osseous abnormality. Mild gaseous distension of the colon incidentally noted.  IMPRESSION:  No acute cardiopulmonary disease.  No significant interval change in the appearance of the chest compared to 02/02/2012.   Original Report Authenticated By: Malachy Moan, M.D.    Dg Chest 2 View  02/02/2012  *RADIOLOGY REPORT*  Clinical Data: Shortness of breath.  CHEST - 2 VIEW  Comparison: 11/30/2011  Findings: Prior right upper lobectomy.  Stable scarring throughout both lung fields and elevation of right lung base.  No active infiltrates or failure.  Normal heart size.  Calcified tortuous aorta.  Prior cervical fusion.  IMPRESSION: Stable chest.   Original Report Authenticated By: Davonna Belling, M.D.    Ct Angio Chest Pe W/cm &/or Wo Cm  02/04/2012  *RADIOLOGY REPORT*  Clinical Data: Persistent cough, pain.  Chest congestion.  CT ANGIOGRAPHY CHEST  Technique:  Multidetector CT imaging of the chest using the standard protocol during bolus administration of intravenous contrast. Multiplanar reconstructed images including MIPs were obtained and reviewed to evaluate the vascular anatomy.   Contrast: OMNIPAQUE IOHEXOL 350 MG/ML SOLN  Comparison: PET 05/22/2011 and CT chest 05/10/2011.  Findings: No pulmonary embolus.  No pathologically enlarged mediastinal, hilar or axillary lymph nodes.  Coronary artery calcification.  Heart size normal.  No pericardial effusion.  Trace left pleural fluid.  Emphysema.  Postoperative changes of right upper lobectomy.  Confluent soft tissue and volume loss in the right middle lobe.  Peribronchovascular nodularity and airspace disease are seen predominantly in the lower lobes, right greater than left.  A 7 mm (9 x 5 mm) subpleural nodule in the left lower lobe (image 34) appears more prominent than on 05/10/2011 (image 31).  Debris seen dependently in the upper trachea.  Incidental imaging of the upper abdomen shows a 6 mm low attenuation lesion in the hepatic dome, too small to characterize. No worrisome lytic or sclerotic lesions.  IMPRESSION:  1.  No pulmonary embolus. 2.  Postoperative changes of right upper lobectomy. 3.  New peribronchovascular nodularity, ground-glass and airspace consolidation in the right middle lobe and both lower lobes, indicative of bronchopneumonia.  Follow-up to clearing is recommended. 4.  Tiny left pleural effusion. 5.  Coronary artery calcification.   Original Report Authenticated By: Leanna Battles, M.D.    Dg Chest Port 1 View  02/06/2012  *RADIOLOGY REPORT*  Clinical Data: Shortness of breath.  Nonproductive cough.  History of asthma.  PORTABLE CHEST - 1 VIEW  Comparison: 02/04/2012 CT and chest x-ray.  Findings: Post right upper lobectomy. Mediastinal shift to the right.  CT detected right middle lobe and bibasilar air space disease not as well appreciated on the present plain film exam.  Calcified tortuous aorta.  Heart size within normal limits.  IMPRESSION: Post right upper lobectomy. Mediastinal shift to the right.  CT detected right middle lobe and bibasilar air space disease not as well appreciated on the present  plain film exam.  Calcified tortuous aorta.   Original Report Authenticated By: Lacy Duverney, M.D.      PERTINENT LAB RESULTS: CBC:  Basename 02/05/12 1146  WBC 13.6*  HGB 12.2  HCT 36.0  PLT 230   CMET CMP     Component Value Date/Time   NA 141 02/06/2012 0615   K 3.8 02/06/2012 0615   CL 101 02/06/2012 0615   CO2 31 02/06/2012 0615   GLUCOSE 71 02/06/2012 0615   BUN 25* 02/06/2012 0615   CREATININE 1.11* 02/06/2012 0615   CALCIUM 9.6 02/06/2012 0615   PROT 6.8 02/02/2012 1808   ALBUMIN 2.8* 02/02/2012 1808   AST 17 02/02/2012 1808   ALT 19 02/02/2012 1808   ALKPHOS 97 02/02/2012 1808   BILITOT 0.4 02/02/2012 1808   GFRNONAA 51* 02/06/2012 0615   GFRAA 59* 02/06/2012 0615    GFR Estimated Creatinine Clearance: 40.6 ml/min (by C-G formula based on Cr of 1.11). No results found for this basename: LIPASE:2,AMYLASE:2 in the last 72 hours No results found for this basename: CKTOTAL:3,CKMB:3,CKMBINDEX:3,TROPONINI:3 in the last 72 hours No components found with this basename: POCBNP:3 No results found for this basename: DDIMER:2 in the last 72 hours No results found for this basename: HGBA1C:2 in the last 72 hours No results found for this basename: CHOL:2,HDL:2,LDLCALC:2,TRIG:2,CHOLHDL:2,LDLDIRECT:2 in the last 72 hours No results found for this basename: TSH,T4TOTAL,FREET3,T3FREE,THYROIDAB in the last 72 hours No results found for this basename: VITAMINB12:2,FOLATE:2,FERRITIN:2,TIBC:2,IRON:2,RETICCTPCT:2 in the last 72 hours Coags: No results found for this basename: PT:2,INR:2 in the last 72 hours Microbiology: No results found for this or any previous visit (from the past 240 hour(s)).   BRIEF HOSPITAL COURSE:   Principal Problem:  *Acute-on-chronic respiratory failure -Likely secondary to COPD exacerbation -significantly better and back to baseline -resume home O2 on discharge  Active Problems:  COPD (chronic obstructive pulmonary disease) -much better, on  admission, started on Solumedrol, IV Antibiotics. Given slow improvement, PCCM was  consulted. She is much better clinically. -On discharge start Spiriva -continue with Advair -c/w Nebulized bronchodilators/Inhalers as needed -taper off steroids over a 10 day period -has follow up with Pulmonology  Bronchopneumonia  -CT Chest did show bronchopneumonia -she was on Rocephin and Zithromax while inpatient, will be transitioned to Levaquin on Discharge  Acute sinusitis w/ persistent post-nasal gtt -better -continue with above noted medications -was likely playing a role in her SOB  oral candidiasis/ thrush  -on Diflucan since 11/20, continue on discharge    GERD  -PPI   Acute renal failure  -pre-renal  -resolved   Leukocytosis  -2/2 to PNA and steroids   Hyponatremia  -2/2 to dehydration  -resolved with Hydration   Recent Lobectomy for Right lung mass (non-malignant)  -follow up CT Scan in 6 months  TODAY-DAY OF DISCHARGE:  Subjective:   Baron Hamper today has no headache,no chest abdominal pain,no new weakness tingling or numbness, feels much better wants to go home today.   Objective:   Blood pressure 148/95, pulse 83, temperature 98.1 F (36.7 C), temperature source Oral, resp. rate 18, height 5' 7.2" (1.707 m), weight 51.6 kg (113 lb 12.1 oz), SpO2 98.00%.  Intake/Output Summary (Last 24 hours) at 02/08/12 0855 Last data filed at 02/08/12 0446  Gross per 24 hour  Intake    750 ml  Output    650 ml  Net    100 ml    Exam Awake Alert, Oriented *3, No new F.N deficits, Normal affect [place].AT,PERRAL Supple Neck,No JVD, No cervical lymphadenopathy appriciated.  Symmetrical Chest wall movement, Good air movement bilaterally, CTAB RRR,No Gallops,Rubs or new Murmurs, No Parasternal Heave +ve B.Sounds, Abd Soft, Non tender, No organomegaly appriciated, No rebound -guarding or rigidity. No Cyanosis, Clubbing or edema, No new Rash or bruise  DISCHARGE  CONDITION: Stable  DISPOSITION:  HOME  DISCHARGE INSTRUCTIONS:    Activity:  As tolerated with Full fall precautions use walker/cane & assistance as needed  Diet recommendation: Heart Healthy diet      Follow-up Information    Follow up with Oretha Milch., MD. On 02/13/2012. (230pm)    Contact information:   [address] [place] [address] [phone]       Follow up with BUTLER, CYNTHIA, DO. Schedule an appointment as soon as possible for a visit in 1 week.   Contact information:   [address] [place] [address] [phone]          Total Time spent on discharge equals 45 minutes.  SignedJeoffrey Massed 02/08/2012 8:55 AM

## 2012-02-13 ENCOUNTER — Ambulatory Visit (INDEPENDENT_AMBULATORY_CARE_PROVIDER_SITE_OTHER): Payer: Medicare Other | Admitting: Pulmonary Disease

## 2012-02-13 ENCOUNTER — Inpatient Hospital Stay: Payer: Medicare Other | Admitting: Adult Health

## 2012-02-13 ENCOUNTER — Encounter: Payer: Self-pay | Admitting: Pulmonary Disease

## 2012-02-13 VITALS — BP 110/78 | HR 89 | Temp 97.6°F | Ht 66.5 in | Wt 115.2 lb

## 2012-02-13 DIAGNOSIS — J441 Chronic obstructive pulmonary disease with (acute) exacerbation: Secondary | ICD-10-CM

## 2012-02-13 NOTE — Progress Notes (Signed)
  Subjective:    Patient ID: Shawna [name], female    DOB: November 28, 1945, 66 y.o.   MRN: [id number]  HPI 65 year old pt w/ mild obstructive disease by PFTs in march 2013, prior RUL lobectomy in April 2013. Admitted 11/16 for AECOPD and CAP.  She has been on home O2 since 6/13 (by PCP) Quit smoking 3/13 ABGs showed compensated hypercarbia. CT chest 11/18 No pulmonary embolus.  New peribronchovascular nodularity, ground-glass and airspace consolidation in the right middle lobe and both lower lobes, indicative of bronchopneumonia. Follow-up to clearing is recommended. Tiny left pleural effusion. Coronary artery calcification.  Echo 11/20>>>Left ventricle:Systolic function was normal.  ejection fraction was in the range of 55% to 60%.  02/13/2012 Post hosp FU visit Still gets SOB occasionally, cough w/ clear-yellow phlem. no wheezing, chest tx. only wheeze when she is very SOB. She's compliant with oxygen. She has 2 days left on her antibiotics and is down to 20 mg of prednisone. Daughter wonders what is causing her mood changes.  Past Medical History  Diagnosis Date  . COPD (chronic obstructive pulmonary disease)   . HTN (hypertension)   . B12 deficiency   . OA (osteoarthritis)   . Hypokalemia   . Mass of lung     Past Surgical History  Procedure Date  . Carpal tunnel release     bilateral  . Tubal ligation     x 2  . Cervical spine surgery      Review of Systems neg for any significant sore throat, dysphagia, itching, sneezing, nasal congestion or excess/ purulent secretions, fever, chills, sweats, unintended wt loss, pleuritic or exertional cp, hempoptysis, orthopnea pnd or change in chronic leg swelling. Also denies presyncope, palpitations, heartburn, abdominal pain, nausea, vomiting, diarrhea or change in bowel or urinary habits, dysuria,hematuria, rash, arthralgias, visual complaints, headache, numbness weakness or ataxia.     Objective:   Physical Exam  Gen. Pleasant,  well-nourished, in no distress, normal affect ENT - no lesions, no post nasal drip Neck: No JVD, no thyromegaly, no carotid bruits Lungs: no use of accessory muscles, no dullness to percussion, decreased without rales or rhonchi  Cardiovascular: Rhythm regular, heart sounds  normal, no murmurs or gallops, no peripheral edema Abdomen: soft and non-tender, no hepatosplenomegaly, BS normal. Musculoskeletal: No deformities, no cyanosis or clubbing Neuro:  alert, non focal        Assessment & Plan:

## 2012-02-13 NOTE — Patient Instructions (Signed)
Complete course of prednisone & antibiotic STOP ipratropium nebs OK to cut down albuterol nebs twice daily OK to stop claritin dec 15th  You will benefit from pulmonary rehab Check oxygen level next visit

## 2012-02-13 NOTE — Assessment & Plan Note (Signed)
Complete course of prednisone & antibiotic STOP ipratropium nebs OK to cut down albuterol nebs twice daily OK to stop claritin dec 15th  You will benefit from pulmonary rehab at [facility] Check oxygen level next visit & spirometry pre-post in future

## 2012-02-19 ENCOUNTER — Ambulatory Visit: Payer: Medicare Other | Admitting: Internal Medicine

## 2012-03-27 ENCOUNTER — Encounter: Payer: Self-pay | Admitting: Adult Health

## 2012-03-27 ENCOUNTER — Ambulatory Visit (INDEPENDENT_AMBULATORY_CARE_PROVIDER_SITE_OTHER): Payer: Medicare Other | Admitting: Adult Health

## 2012-03-27 VITALS — HR 88 | Temp 97.5°F | Ht 66.5 in | Wt 122.8 lb

## 2012-03-27 DIAGNOSIS — J449 Chronic obstructive pulmonary disease, unspecified: Secondary | ICD-10-CM

## 2012-03-27 NOTE — Patient Instructions (Addendum)
Bring Drug Formulary to next office visit.  We referred you to pulmonary rehab.  Continue on current regimen.  Follow up 3 months with Dr. Vassie Loll

## 2012-03-27 NOTE — Progress Notes (Signed)
Subjective:    Patient ID: Shawna [name], female    DOB: 1945-11-16, 67 y.o.   MRN: [id number]  HPI 67 year old pt w/ mild obstructive disease by PFTs in march 2013, prior RUL lobectomy in April 2013. Admitted 11/16 for AECOPD and CAP.  She has been on home O2 since 6/13 (by PCP) Quit smoking 3/13 ABGs showed compensated hypercarbia. CT chest 11/18 No pulmonary embolus.  New peribronchovascular nodularity, ground-glass and airspace consolidation in the right middle lobe and both lower lobes, indicative of bronchopneumonia. Follow-up to clearing is recommended. Tiny left pleural effusion. Coronary artery calcification.  Echo 11/20>>>Left ventricle:Systolic function was normal.  ejection fraction was in the range of 55% to 60%.   02/13/12  Post hosp FU visit Still gets SOB occasionally, cough w/ clear-yellow phlem. no wheezing, chest tx. only wheeze when she is very SOB. She's compliant with oxygen. She has 2 days left on her antibiotics and is down to 20 mg of prednisone. Daughter wonders what is causing her mood changes. >>finish abx and steroids   03/27/12 Follow up  Patient returns for a 6 week followup. Pt reports breathing is better, still little short winded at times w activities around her home-- pt states she has not worn o2 (2l) during day x2 days but will wear at night--denies any other concerns She re-qualifies for O2 today w/ sats below.  Patient Saturations on Room Air at Rest = 98%  Patient Saturations on [employer] while Ambulating = 86%  Patient Saturations on 2 Liters of oxygen while Ambulating = 95% She was encouraged to wear O2 w/ activity  Does complain that insurance charges a lot of money for Advair and spiriva  We discussed alternatives however she will bring her formulary list to next ov.     Past Medical History  Diagnosis Date  . COPD (chronic obstructive pulmonary disease)   . HTN (hypertension)   . B12 deficiency   . OA (osteoarthritis)   . Hypokalemia     . Mass of lung     Past Surgical History  Procedure Date  . Carpal tunnel release     bilateral  . Tubal ligation     x 2  . Cervical spine surgery      Review of Systems Constitutional:   No  weight loss, night sweats,  Fevers, chills, + fatigue, or  lassitude.  HEENT:   No headaches,  Difficulty swallowing,  Tooth/dental problems, or  Sore throat,                No sneezing, itching, ear ache,  +nasal congestion, post nasal drip,   CV:  No chest pain,  Orthopnea, PND, swelling in lower extremities, anasarca, dizziness, palpitations, syncope.   GI  No heartburn, indigestion, abdominal pain, nausea, vomiting, diarrhea, change in bowel habits, loss of appetite, bloody stools.   Resp:  No coughing up of blood.  No change in color of mucus.  No wheezing.  No chest wall deformity  Skin: no rash or lesions.  GU: no dysuria, change in color of urine, no urgency or frequency.  No flank pain, no hematuria   MS:  No joint pain or swelling.  No decreased range of motion.  No back pain.  Psych:  No change in mood or affect. No depression or anxiety.  No memory loss.         Objective:   Physical Exam   Gen. Pleasant, in no distress, normal affect ENT -  no lesions, no post nasal drip Neck: No JVD, no thyromegaly, no carotid bruits Lungs: no use of accessory muscles, no dullness to percussion, decreased without rales or rhonchi  Cardiovascular: Rhythm regular, heart sounds  normal, no murmurs or gallops, no peripheral edema Abdomen: soft and non-tender, no hepatosplenomegaly, BS normal. Musculoskeletal: No deformities, no cyanosis or clubbing Neuro:  alert, non focal        Assessment & Plan:

## 2012-04-02 NOTE — Assessment & Plan Note (Addendum)
Compensated on present regimen  However cost of medications may be an issues was advised to return w/ drug formulary on return  Also can consider change to nebs   Plan   Bring Drug Formulary to next office visit.  We referred you to pulmonary rehab.  Continue on current regimen.  Follow up 3 months with Dr. Vassie Loll

## 2012-04-21 ENCOUNTER — Encounter: Payer: Self-pay | Admitting: Gastroenterology

## 2012-04-29 ENCOUNTER — Ambulatory Visit
Admission: RE | Admit: 2012-04-29 | Discharge: 2012-04-29 | Disposition: A | Discharge status: Home / Self Care | Payer: Medicare Other | Source: Ambulatory Visit | Attending: Thoracic Surgery (Cardiothoracic Vascular Surgery) | Admitting: Thoracic Surgery (Cardiothoracic Vascular Surgery)

## 2012-04-29 ENCOUNTER — Ambulatory Visit (INDEPENDENT_AMBULATORY_CARE_PROVIDER_SITE_OTHER): Payer: Medicare Other | Admitting: Thoracic Surgery (Cardiothoracic Vascular Surgery)

## 2012-04-29 ENCOUNTER — Encounter: Payer: Self-pay | Admitting: Thoracic Surgery (Cardiothoracic Vascular Surgery)

## 2012-04-29 VITALS — BP 136/91 | HR 82 | Resp 18 | Ht 66.0 in | Wt 122.0 lb

## 2012-04-29 DIAGNOSIS — J984 Other disorders of lung: Secondary | ICD-10-CM

## 2012-04-29 DIAGNOSIS — C3490 Malignant neoplasm of unspecified part of unspecified bronchus or lung: Secondary | ICD-10-CM

## 2012-04-29 DIAGNOSIS — R911 Solitary pulmonary nodule: Secondary | ICD-10-CM

## 2012-04-29 NOTE — Progress Notes (Signed)
HPI: [name] returns for a scheduled followup visit. We were originally supposed to see her in November for a 6 month followup visit after a right upper lobectomy. However she was hospitalized at [facility] with pneumonia at that time. She had a right upper lobectomy for a suspicious mass central in the right upper lobe on CT scan. This turned out not to be a cancer. There was an abnormal smooth muscle proliferation, but no other pathology was seen. Plan was to do a chest x-ray in 6 months and then a repeat CT at 1 year, to make sure that nothing else was showing out. She did have a CT at [facility] in November which showed pneumonia in both the right lower and middle lobes. She had done well after that until last week when she developed a cough and and shortness of breath. She currently is being treated for bronchitis with antibiotics and prednisone. She does still have some pain from time to time in her right chest from the surgery, this is worse when it is cold. She also is been through a lot recently with problems with her heater and water issues. She says that she has been smoking less than half a pack a day over the past couple of weeks because of stress.  Past Medical History  Diagnosis Date  . COPD (chronic obstructive pulmonary disease)   . HTN (hypertension)   . B12 deficiency   . OA (osteoarthritis)   . Hypokalemia   . Mass of lung   . GERD (gastroesophageal reflux disease)   . Diverticulosis of colon (without mention of hemorrhage)   . 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 albuterol (PROVENTIL HFA;VENTOLIN HFA) 108 (90 BASE) MCG/ACT inhaler Inhale 2 puffs into the lungs every 4 (four) hours as needed. For shortness of breath      . albuterol (PROVENTIL) (2.5 MG/3ML) 0.083% nebulizer solution Take 2.5 mg by nebulization 2 (two) times daily. For shortness of breath      . ALPRAZolam (XANAX) 0.25 MG  tablet Take 0.25 mg by mouth 2 (two) times daily as needed. For sleep      . cyanocobalamin (,VITAMIN B-12,) 1000 MCG/ML injection Inject 1,000 mcg into the muscle every 30 (thirty) days.      Marland Kitchen esomeprazole (NEXIUM) 40 MG capsule Take 40 mg by mouth daily before breakfast.      . fluticasone (FLONASE) 50 MCG/ACT nasal spray Place 1 spray into the nose daily as needed.      . Fluticasone-Salmeterol (ADVAIR) 500-50 MCG/DOSE AEPB Inhale 2 puffs into the lungs 2 (two) times daily.       Marland Kitchen guaiFENesin (MUCINEX) 600 MG 12 hr tablet Take 1,200 mg by mouth daily.      . hydrochlorothiazide (HYDRODIURIL) 25 MG tablet Take 25 mg by mouth daily.      Marland Kitchen ipratropium (ATROVENT) 0.02 % nebulizer solution Take 2.5 mLs (500 mcg total) by nebulization every 4 (four) hours as needed for wheezing.  75 mL    . KLOR-CON M20 20 MEQ tablet Take 20 mEq by mouth daily.       Marland Kitchen loratadine (CLARITIN) 10 MG tablet Take 1 tablet (10 mg total) by mouth daily.  30 tablet  0  . meloxicam (MOBIC) 15 MG tablet Take 15 mg by mouth daily.      Marland Kitchen omeprazole (PRILOSEC) 40 MG capsule Take 1 capsule (40 mg total) by  mouth daily.  30 capsule  0  . predniSONE (DELTASONE) 10 MG tablet       . sucralfate (CARAFATE) 1 G tablet       . tiotropium (SPIRIVA HANDIHALER) 18 MCG inhalation capsule Place 1 capsule (18 mcg total) into inhaler and inhale daily.  30 capsule  12  . verapamil (COVERA HS) 180 MG (CO) 24 hr tablet Take 180 mg by mouth at bedtime.       No current facility-administered medications for this visit.    Physical Exam: BP 136/91  Pulse 82  Resp 18  Ht 5\' 6"  (1.676 m)  Wt 122 lb (55.339 kg)  BMI 19.7 kg/m2  SpO18 66% 66 year old woman in no acute distress No cervical or supraclavicular adenopathy Lungs coarse breath sounds bilaterally Incisions well healed  Diagnostic Tests: Chest x-ray 04/29/12 CHEST - 2 VIEW  Comparison: Portable chest x-ray of 02/06/2012  Findings: Changes of right upper lobectomy are noted  with scarring  at the right lung base and volume loss on the right. The left lung  is clear. Mediastinal contours are stable. The heart is within  normal limits in size. No skeletal abnormality is seen. A lower  anterior cervical spine fusion plate is noted.  IMPRESSION:  Stable postoperative change on the right. No active lung disease.    Impression: [name] is a 67 year old woman who had a right upper lobectomy for a right lung mass. This turned out to be a bizarre smooth muscle proliferation, but was not cancerous at least. She has started smoking again after having quit for several months. She cannot tolerate Chantix and says that patches do not totally relieve her cravings. She currently is being treated for bronchitis. She did have a CT scan in November which was 6 months from her surgery which showed pneumonia in both the right lower and middle lobes. I still think it's a good idea to get another CT scan on her in may for one year followup from the lung nodule as well as 6 month followup from the pneumonia to make sure that all these changes of resolved and there is nothing new or suspicious.  Plan: Return in 3 months with CT of chest

## 2012-05-06 ENCOUNTER — Ambulatory Visit (INDEPENDENT_AMBULATORY_CARE_PROVIDER_SITE_OTHER): Payer: Medicare Other | Admitting: Adult Health

## 2012-05-06 ENCOUNTER — Encounter: Payer: Self-pay | Admitting: Gastroenterology

## 2012-05-06 ENCOUNTER — Encounter: Payer: Self-pay | Admitting: Adult Health

## 2012-05-06 ENCOUNTER — Ambulatory Visit (INDEPENDENT_AMBULATORY_CARE_PROVIDER_SITE_OTHER): Payer: Medicare Other | Admitting: Gastroenterology

## 2012-05-06 VITALS — BP 98/68 | HR 96 | Ht 66.5 in | Wt 121.0 lb

## 2012-05-06 VITALS — BP 118/78 | HR 97 | Temp 98.3°F | Ht 66.5 in | Wt 121.8 lb

## 2012-05-06 DIAGNOSIS — R079 Chest pain, unspecified: Secondary | ICD-10-CM

## 2012-05-06 DIAGNOSIS — J953 Chronic pulmonary insufficiency following surgery: Secondary | ICD-10-CM

## 2012-05-06 DIAGNOSIS — J441 Chronic obstructive pulmonary disease with (acute) exacerbation: Secondary | ICD-10-CM

## 2012-05-06 DIAGNOSIS — J9589 Other postprocedural complications and disorders of respiratory system, not elsewhere classified: Secondary | ICD-10-CM

## 2012-05-06 MED ORDER — NYSTATIN 100000 UNIT/ML MT SUSP: 500000.0000 [IU] | Freq: Four times a day (QID) | OROMUCOSAL | Status: DC

## 2012-05-06 MED ORDER — HYDROCODONE-HOMATROPINE 5-1.5 MG/5ML PO SYRP: 5.0000 mL | ORAL_SOLUTION | Freq: Four times a day (QID) | ORAL | Status: DC | PRN

## 2012-05-06 MED ORDER — FLUCONAZOLE 100 MG PO TABS: ORAL_TABLET | ORAL | Status: DC

## 2012-05-06 MED ORDER — PREDNISONE 10 MG PO TABS: ORAL_TABLET | ORAL | Status: DC

## 2012-05-06 NOTE — Progress Notes (Signed)
History of Present Illness:  This is a very sick and very complicated 67 year old Caucasian female with severe COPD who recently one year ago had a right upper lobe lobectomy apparently for benign lung tumor.  Over the last year she's had worsening COPD with chronic coughing, shortness of breath, clear sputum production, and has become oxygen dependent.  She been on multiple courses of antibiotics and prednisone, and is currently going back on prednisone because of her shortness of breath.  Her GI issues going back several years with a normal endoscopy in 2008.  The patient currently describes rather severe burning substernal chest pain radiating into her back without dysphagia.  She's been on Nexium and liquid Carafate for one month without improvement.  She does relate that she has severe thrush with a recent hospitalization.  The patient denies any hepatobiliary or lower gastrointestinal problems except for some mild constipation from narcotic use requiring when necessary MiraLax.  She is on multiple inhalers, Mucinex, HCTZ and verapamil.  I have reviewed this patient's present history, medical and surgical past history, allergies and medications.     ROS:   All systems were reviewed and are negative unless otherwise stated in the HPI.    Physical Exam: Blood pressure 90/68, pulse 96 and regular, and weight 121 with 95% oxygen saturation on O2.  She is a chronically ill-appearing patient who is constantly coughing making interview and exam difficult.  General well developed well nourished patient in no acute distress, appearing their stated age.  Were pharyngeal exam showed rather severe erythema in the posterior pharyngeal area but no definite Candida plaques. Eyes PERRLA, no icterus, fundoscopic exam per opthamologist Skin no lesions noted Neck supple, no adenopathy, no thyroid enlargement, no tenderness Chest wheezes and rhonchi in both lung fields with diminished spirometric pasty. Heart no  significant murmurs, gallops or rubs noted Abdomen no hepatosplenomegaly masses or tenderness, BS normal.  Extremities no acute joint lesions, edema, phlebitis or evidence of cellulitis. Neurologic patient oriented x 3, cranial nerves intact, no focal neurologic deficits noted. Psychological mental status normal and normal affect.  Assessment and plan: This patient has severe pulmonary compromise and is oxygen dependent.  She is not a candidate for conscious sedation and endoscopy per her very tenuous pulmonary status..  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  I have discontinued these medications and have placed her on Mycostatin mouthwash 5 cc 4 times a day, Diflucan 100 mg twice a day for one day and then daily for 2 weeks, and she is to call for progress report.  She is up-to-date on her colonoscopy last done in 2008.  He is to continue other medications as listed and reviewed.   Please copy her primary care physician, referring physician, and pertinent subspecialists.  DME = [facility]  SATURATION QUALIFICATIONS: (This note is used to comply with regulatory documentation for home oxygen) Patient Saturations on Room Air at Rest = 98% Patient Saturations on Room Air while Ambulating = 86% Patient Saturations on 2 Liters of oxygen while Ambulating = 95% Please briefly explain why patient needs home oxygen: continue on O2 2 lpm w/ activity Boone Master 03-27-2012      No diagnosis found.

## 2012-05-06 NOTE — Assessment & Plan Note (Signed)
Slow to resolve flare  Recent xray w/ nad   Plan  Finish Clarithromycin  Prednisone taper as directed.  Fluids and rest  No smoking.  Hydromet 1 tsp every 6 hr As needed  Cough , may make you sleepy.  follow up 1 month Dr. Vassie Loll  And As needed   Please contact office for sooner follow up if symptoms do not improve or worsen or seek emergency care

## 2012-05-06 NOTE — Patient Instructions (Signed)
Please stop Carafate and Nexium  We have sent the following medications to your pharmacy for you to pick up at your convenience: Diflucan, please take two tablets by mouth on day one and then one tablet by mouth once daily for two weeks. AND Nystatin, take as directed.  Please call us back if not feeling better.

## 2012-05-06 NOTE — Patient Instructions (Addendum)
Finish Clarithromycin  Prednisone taper as directed.  Fluids and rest  No smoking.  Hydromet 1 tsp every 6 hr As needed  Cough , may make you sleepy.  follow up 1 month Dr. Vassie Loll  And As needed   Please contact office for sooner follow up if symptoms do not improve or worsen or seek emergency care

## 2012-05-06 NOTE — Progress Notes (Signed)
Subjective:    Patient ID: Shawna [name], female    DOB: 21-Mar-1945, 67 y.o.   MRN: [id number]  HPI 67 year old pt w/ mild obstructive disease by PFTs in march 2013, prior RUL lobectomy in April 2013. Admitted 11/16 for AECOPD and CAP.  She has been on home O2 since 6/13 (by PCP) Quit smoking 3/13 ABGs showed compensated hypercarbia. CT chest 11/18 No pulmonary embolus.  New peribronchovascular nodularity, ground-glass and airspace consolidation in the right middle lobe and both lower lobes, indicative of bronchopneumonia. Follow-up to clearing is recommended. Tiny left pleural effusion. Coronary artery calcification.  Echo 11/20>>>Left ventricle:Systolic function was normal.  ejection fraction was in the range of 55% to 60%.   02/13/12  Post hosp FU visit Still gets SOB occasionally, cough w/ clear-yellow phlem. no wheezing, chest tx. only wheeze when she is very SOB. She's compliant with oxygen. She has 2 days left on her antibiotics and is down to 20 mg of prednisone. Daughter wonders what is causing her mood changes. >>finish abx and steroids   03/27/12 Follow up  Patient returns for a 6 week followup. Pt reports breathing is better, still little short winded at times w activities around her home-- pt states she has not worn o2 (2l) during day x2 days but will wear at night--denies any other concerns She re-qualifies for O2 today w/ sats below.  Patient Saturations on Room Air at Rest = 98%  Patient Saturations on [employer] while Ambulating = 86%  Patient Saturations on 2 Liters of oxygen while Ambulating = 95% She was encouraged to wear O2 w/ activity  Does complain that insurance charges a lot of money for Advair and spiriva  We discussed alternatives however she will bring her formulary list to next ov.   05/06/2012 Acute OV  Complains of chest congestion, dry cough, increased SOB, wheezing, tightness in chest, chills x5days.  was given clarithromycin 500mg  x10 days by PCP, finish  tomorrow. Started back smoking, stress at home.  CXR last week w/ NAD.  Had some left over prednisone started 10mg  daily  Having break thru reflux despite nexium and carafate.  Has GI ov today .  No hemoptysis, edema , no chest pain or orthopnea.  More wheezing last few days.  Cough is keeping her up at night .     Past Medical History  Diagnosis Date  . COPD (chronic obstructive pulmonary disease)   . HTN (hypertension)   . B12 deficiency   . OA (osteoarthritis)   . Hypokalemia   . Mass of lung   . GERD (gastroesophageal reflux disease)   . Diverticulosis of colon (without mention of hemorrhage)   . 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 Carpal tunnel release      bilateral  . Tubal ligation      x 2  . Cervical spine surgery       Review of Systems Constitutional:   No  weight loss, night sweats,  Fevers, chills, + fatigue, or  lassitude.  HEENT:   No headaches,  Difficulty swallowing,  Tooth/dental problems, or  Sore throat,                No sneezing, itching, ear ache,  +nasal congestion, post nasal drip,   CV:  No chest pain,  Orthopnea, PND, swelling in lower extremities, anasarca, dizziness, palpitations, syncope.   GI  No  nausea, vomiting, diarrhea,  change in bowel habits, loss of appetite, bloody stools.   Resp:  No coughing up of blood.  Marland Kitchen  No chest wall deformity  Skin: no rash or lesions.  GU: no dysuria, change in color of urine, no urgency or frequency.  No flank pain, no hematuria   MS:  No joint pain or swelling.  No decreased range of motion.  No back pain.  Psych:  No change in mood or affect. No depression or anxiety.  No memory loss.         Objective:   Physical Exam   Gen. Pleasant, in no distress, normal affect ENT - no lesions, no post nasal drip Neck: No JVD, no thyromegaly, no carotid bruits Lungs:diminished BS in bases , faint exp wheeze    Cardiovascular: Rhythm regular, heart sounds  normal, no murmurs or gallops, no peripheral edema Abdomen: soft and non-tender, no hepatosplenomegaly, BS normal. Musculoskeletal: No deformities, no cyanosis or clubbing Neuro:  alert, non focal        Assessment & Plan:

## 2012-05-12 ENCOUNTER — Encounter: Payer: Self-pay | Admitting: Internal Medicine

## 2012-05-12 ENCOUNTER — Other Ambulatory Visit: Payer: Self-pay | Admitting: Gastroenterology

## 2012-05-12 ENCOUNTER — Encounter: Payer: Self-pay | Admitting: Gastroenterology

## 2012-05-12 ENCOUNTER — Telehealth: Payer: Self-pay | Admitting: Gastroenterology

## 2012-05-12 MED ORDER — NYSTATIN 100000 UNIT/ML MT SUSP: 500000.0000 [IU] | Freq: Four times a day (QID) | OROMUCOSAL | Status: DC

## 2012-05-12 NOTE — Telephone Encounter (Signed)
lmom for pt to call back

## 2012-05-12 NOTE — Telephone Encounter (Signed)
Pt reports she was only given enough Nystatin for 3.5 days and so she tried to stretch it over the week, but her stomach is really burning. Per Dr Jarold Motto, OK to refill if helping. Informed pt to call back for other problems; pt stated understanding.

## 2012-06-26 ENCOUNTER — Ambulatory Visit (INDEPENDENT_AMBULATORY_CARE_PROVIDER_SITE_OTHER): Payer: Medicare Other | Admitting: Pulmonary Disease

## 2012-06-26 ENCOUNTER — Encounter: Payer: Self-pay | Admitting: Pulmonary Disease

## 2012-06-26 VITALS — BP 122/80 | HR 85 | Temp 97.6°F | Ht 66.5 in | Wt 119.4 lb

## 2012-06-26 DIAGNOSIS — J441 Chronic obstructive pulmonary disease with (acute) exacerbation: Secondary | ICD-10-CM

## 2012-06-26 DIAGNOSIS — R911 Solitary pulmonary nodule: Secondary | ICD-10-CM

## 2012-06-26 MED ORDER — ARFORMOTEROL TARTRATE 15 MCG/2ML IN NEBU: 15.0000 ug | INHALATION_SOLUTION | Freq: Two times a day (BID) | RESPIRATORY_TRACT | Status: DC

## 2012-06-26 MED ORDER — BUDESONIDE 0.5 MG/2ML IN SUSP: 0.5000 mg | Freq: Two times a day (BID) | RESPIRATORY_TRACT | Status: DC

## 2012-06-26 NOTE — Progress Notes (Signed)
  Subjective:    Patient ID: Shawna [name], female    DOB: Jan 26, 1946, 67 y.o.   MRN: [id number]  HPI 67 year old pt w/ mild obstructive disease by PFTs in march 2013, prior RUL lobectomy in April 2013. Admitted 02/02/12 for AECOPD and CAP.  She has been on home O2 since 6/13 (by PCP)  Quit smoking 3/13 but relapsed in 2014 ABGs showed compensated hypercarbia.  CT chest 11/18 No pulmonary embolus. New peribronchovascular nodularity, ground-glass and airspace consolidation in the right middle lobe and both lower lobes, indicative of bronchopneumonia.  Tiny left pleural effusion.  Echo 11/20>>>Ejection fraction - 55% to 60%.   06/26/2012  Pt stated her breathing is worse x 4ds. C/o dry cough, wheezing, chest tx. Pt began taking pred 10 mg daily x monday and it has helped some.  05/06/2012 Acute OV  -clarithromycin , prednisone Started back smoking, stress at home.  CXR feb '14 Lt ASd has resol ved, no nodules  Did not get call from pulm rehab Satn 95% on RA Advair & spiriva are very expensive & she would like substitutes  Past Medical History  Diagnosis Date  . COPD (chronic obstructive pulmonary disease)   . HTN (hypertension)   . B12 deficiency   . OA (osteoarthritis)   . Hypokalemia   . Mass of lung   . GERD (gastroesophageal reflux disease)   . Diverticulosis of colon (without mention of hemorrhage)   . 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 Also denies presyncope, palpitations, heartburn, abdominal pain, nausea, vomiting, diarrhea or change in bowel or urinary habits, dysuria,hematuria, rash, arthralgias, visual complaints, headache, numbness weakness or ataxia.     Objective:   Physical  Exam  Gen. Pleasant, thin woman, in no distress, normal affect ENT - no lesions, no post nasal drip Neck: No JVD, no thyromegaly, no carotid bruits Lungs: no use of accessory muscles, no dullness to percussion, decreased without rales, faint scattered rhonchi  Cardiovascular: Rhythm regular, heart sounds  normal, no murmurs or gallops, no peripheral edema Abdomen: soft and non-tender, no hepatosplenomegaly, BS normal. Musculoskeletal: No deformities, no cyanosis or clubbing Neuro:  alert, non focal       Assessment & Plan:

## 2012-06-26 NOTE — Patient Instructions (Signed)
Prednisone 5 mg tabs  Take 4 tabs daily with food x 5ds, then 2 tab daily with food x 5ds, then 1 tab daily x 5ds then STOP You have to quit smoking - OK to use nicotine patch Ct scan in May 2014 We will refer to pulm rehab in [place] Substitute budesonide & brovana nebs twice daily  for advair  Substitute atrovent nebs for spiriva

## 2012-06-26 NOTE — Assessment & Plan Note (Signed)
Ct scan in May 2014

## 2012-06-26 NOTE — Assessment & Plan Note (Signed)
Prednisone 5 mg tabs  Take 4 tabs daily with food x 5ds, then 2 tab daily with food x 5ds, then 1 tab daily x 5ds then STOP You have to quit smoking - OK to use nicotine patch We will refer to pulm rehab in [place] Substitute budesonide & brovana nebs twice daily  for advair  Substitute atrovent nebs for spiriva

## 2012-07-25 ENCOUNTER — Other Ambulatory Visit: Payer: Self-pay | Admitting: *Deleted

## 2012-07-25 DIAGNOSIS — C3490 Malignant neoplasm of unspecified part of unspecified bronchus or lung: Secondary | ICD-10-CM

## 2012-08-01 ENCOUNTER — Telehealth: Payer: Self-pay | Admitting: Pulmonary Disease

## 2012-08-01 NOTE — Telephone Encounter (Signed)
ATC patient no answer [id number] 

## 2012-08-01 NOTE — Telephone Encounter (Signed)
I spoke with the pt and she states that she has a ct scheduled by Dr. Vassie Loll on 08-06-12, but then [facility] called and scheduled one on for 08-04-12. Pt wanted to know what to do . According to epic the CT was cancelled for [facility] so I advised the pt to contact them to see what she needs to do.Carron Curie, CMA

## 2012-08-04 ENCOUNTER — Other Ambulatory Visit: Payer: Medicare Other

## 2012-08-05 ENCOUNTER — Ambulatory Visit: Payer: Medicare Other | Admitting: Thoracic Surgery (Cardiothoracic Vascular Surgery)

## 2012-08-06 ENCOUNTER — Ambulatory Visit (INDEPENDENT_AMBULATORY_CARE_PROVIDER_SITE_OTHER)
Admission: RE | Admit: 2012-08-06 | Discharge: 2012-08-06 | Disposition: A | Discharge status: Home / Self Care | Payer: Medicare Other | Source: Ambulatory Visit | Attending: Pulmonary Disease | Admitting: Pulmonary Disease

## 2012-08-06 DIAGNOSIS — R911 Solitary pulmonary nodule: Secondary | ICD-10-CM

## 2012-08-12 ENCOUNTER — Ambulatory Visit: Payer: Medicare Other | Admitting: Thoracic Surgery (Cardiothoracic Vascular Surgery)

## 2012-08-14 ENCOUNTER — Other Ambulatory Visit: Payer: Self-pay | Admitting: Pulmonary Disease

## 2012-08-14 DIAGNOSIS — R911 Solitary pulmonary nodule: Secondary | ICD-10-CM

## 2012-09-25 ENCOUNTER — Ambulatory Visit: Payer: Medicare Other | Admitting: Adult Health

## 2012-11-19 ENCOUNTER — Telehealth: Payer: Self-pay | Admitting: Pulmonary Disease

## 2012-11-19 NOTE — Telephone Encounter (Signed)
Needs CT in October & FU  Pt is scheduled for follow up appt with RA 12/25/12 at 3:00. Order for CT is already in EPIC. Please advise PCC's thanks

## 2012-11-19 NOTE — Telephone Encounter (Signed)
LMOAM for pt to return my call to schedule CT Chest. Ollen Gross

## 2012-11-19 NOTE — Telephone Encounter (Signed)
Contacted patient and CT Chest has been scheduled for Thurs. 12/25/12 at 1:30 at [facility]. Pt is aware of this appointment date, time and location. No prep. Appointment cards mailed to patient. Rhonda J Cobb

## 2012-12-05 ENCOUNTER — Telehealth: Payer: Self-pay | Admitting: Pulmonary Disease

## 2012-12-05 ENCOUNTER — Emergency Department (HOSPITAL_COMMUNITY): Payer: Medicare Other

## 2012-12-05 ENCOUNTER — Inpatient Hospital Stay (HOSPITAL_COMMUNITY)
Admission: EM | Admit: 2012-12-05 | Discharge: 2012-12-09 | DRG: 189 | Disposition: A | Discharge status: Home / Self Care | Payer: Medicare Other | Attending: Internal Medicine | Admitting: Internal Medicine

## 2012-12-05 ENCOUNTER — Encounter (HOSPITAL_COMMUNITY): Payer: Self-pay | Admitting: Emergency Medicine

## 2012-12-05 DIAGNOSIS — J449 Chronic obstructive pulmonary disease, unspecified: Secondary | ICD-10-CM | POA: Diagnosis present

## 2012-12-05 DIAGNOSIS — Z9981 Dependence on supplemental oxygen: Secondary | ICD-10-CM

## 2012-12-05 DIAGNOSIS — D510 Vitamin B12 deficiency anemia due to intrinsic factor deficiency: Secondary | ICD-10-CM

## 2012-12-05 DIAGNOSIS — R911 Solitary pulmonary nodule: Secondary | ICD-10-CM

## 2012-12-05 DIAGNOSIS — E538 Deficiency of other specified B group vitamins: Secondary | ICD-10-CM

## 2012-12-05 DIAGNOSIS — Z01810 Encounter for preprocedural cardiovascular examination: Secondary | ICD-10-CM

## 2012-12-05 DIAGNOSIS — E876 Hypokalemia: Secondary | ICD-10-CM

## 2012-12-05 DIAGNOSIS — N179 Acute kidney failure, unspecified: Secondary | ICD-10-CM

## 2012-12-05 DIAGNOSIS — M1990 Unspecified osteoarthritis, unspecified site: Secondary | ICD-10-CM | POA: Diagnosis present

## 2012-12-05 DIAGNOSIS — K449 Diaphragmatic hernia without obstruction or gangrene: Secondary | ICD-10-CM

## 2012-12-05 DIAGNOSIS — K648 Other hemorrhoids: Secondary | ICD-10-CM

## 2012-12-05 DIAGNOSIS — R0902 Hypoxemia: Secondary | ICD-10-CM

## 2012-12-05 DIAGNOSIS — Z720 Tobacco use: Secondary | ICD-10-CM

## 2012-12-05 DIAGNOSIS — J441 Chronic obstructive pulmonary disease with (acute) exacerbation: Secondary | ICD-10-CM | POA: Diagnosis present

## 2012-12-05 DIAGNOSIS — I10 Essential (primary) hypertension: Secondary | ICD-10-CM | POA: Diagnosis present

## 2012-12-05 DIAGNOSIS — J9620 Acute and chronic respiratory failure, unspecified whether with hypoxia or hypercapnia: Principal | ICD-10-CM | POA: Diagnosis present

## 2012-12-05 DIAGNOSIS — D72829 Elevated white blood cell count, unspecified: Secondary | ICD-10-CM

## 2012-12-05 DIAGNOSIS — K2940 Chronic atrophic gastritis without bleeding: Secondary | ICD-10-CM

## 2012-12-05 DIAGNOSIS — K5730 Diverticulosis of large intestine without perforation or abscess without bleeding: Secondary | ICD-10-CM

## 2012-12-05 DIAGNOSIS — F17200 Nicotine dependence, unspecified, uncomplicated: Secondary | ICD-10-CM | POA: Diagnosis present

## 2012-12-05 DIAGNOSIS — E871 Hypo-osmolality and hyponatremia: Secondary | ICD-10-CM

## 2012-12-05 DIAGNOSIS — Z902 Acquired absence of lung [part of]: Secondary | ICD-10-CM

## 2012-12-05 DIAGNOSIS — J019 Acute sinusitis, unspecified: Secondary | ICD-10-CM

## 2012-12-05 HISTORY — DX: Solitary pulmonary nodule: R91.1

## 2012-12-05 MED ORDER — METHYLPREDNISOLONE SODIUM SUCC 125 MG IJ SOLR
125.0000 mg | Freq: Once | INTRAMUSCULAR | Status: AC
  Administered 2012-12-06: 125 mg via INTRAVENOUS
  Filled 2012-12-05: qty 2

## 2012-12-05 MED ORDER — ALBUTEROL SULFATE (5 MG/ML) 0.5% IN NEBU
INHALATION_SOLUTION | RESPIRATORY_TRACT | Status: AC
  Filled 2012-12-05: qty 1

## 2012-12-05 MED ORDER — IPRATROPIUM BROMIDE 0.02 % IN SOLN
RESPIRATORY_TRACT | Status: AC
  Administered 2012-12-06: 0.5 mg
  Filled 2012-12-05: qty 2.5

## 2012-12-05 MED ORDER — ALBUTEROL SULFATE (5 MG/ML) 0.5% IN NEBU
5.0000 mg | INHALATION_SOLUTION | Freq: Once | RESPIRATORY_TRACT | Status: AC
  Administered 2012-12-06: 5 mg via RESPIRATORY_TRACT
  Filled 2012-12-05: qty 1

## 2012-12-05 NOTE — ED Notes (Signed)
Patient transported to X-ray 

## 2012-12-05 NOTE — Telephone Encounter (Signed)
Nothing elso to offer over the phone, needs to go to ER

## 2012-12-05 NOTE — Telephone Encounter (Signed)
Spoke with patients daughter Luster Landsberg, states: x2 days ago: patient was was generally not feeling well x1 day ago: increased difficulty breathing and wearing o2 all day long, spoke with patients NP and orders given for clarithromycin in which she has taken two doses as of now Today patient is Worse than past two days. Renee states she got up to use bathroom and since then @ 2pm she took a neb treatment, used inhaler and is currently taking another nebulizer Renee reports patient is wheezing and is unable to take breath in to do her IS and cant get breath out to do her flutter valve Dr. Sherene Sires please advise, thank you!  Last OV: 06/26/12 Next OV: 12/25/12

## 2012-12-05 NOTE — Telephone Encounter (Signed)
Spoke with Renee and notified of recs per MW She verbalized understanding and states nothing further needed

## 2012-12-05 NOTE — ED Notes (Addendum)
Pt. reports progressing SOB / exertional dyspnea with dry cough onset yesterday , denies fever or chills, pt. stated history of COPD /pulmonary nodule / smokes cigarettes. Prescribed with Biaxin tabs by PCP yesterday.

## 2012-12-06 ENCOUNTER — Encounter (HOSPITAL_COMMUNITY): Payer: Self-pay | Admitting: *Deleted

## 2012-12-06 DIAGNOSIS — J441 Chronic obstructive pulmonary disease with (acute) exacerbation: Secondary | ICD-10-CM

## 2012-12-06 DIAGNOSIS — J9620 Acute and chronic respiratory failure, unspecified whether with hypoxia or hypercapnia: Principal | ICD-10-CM

## 2012-12-06 LAB — BASIC METABOLIC PANEL
BUN: 12 mg/dL (ref 6–23)
BUN: 14 mg/dL (ref 6–23)
CO2: 28 mEq/L (ref 19–32)
CO2: 27 mEq/L (ref 19–32)
Calcium: 10 mg/dL (ref 8.4–10.5)
Calcium: 9.6 mg/dL (ref 8.4–10.5)
Chloride: 95 mEq/L — ABNORMAL LOW (ref 96–112)
Creatinine, Ser: 0.72 mg/dL (ref 0.50–1.10)
Creatinine, Ser: 0.73 mg/dL (ref 0.50–1.10)
GFR calc non Af Amer: 86 mL/min — ABNORMAL LOW (ref >90)
Glucose, Bld: 136 mg/dL — ABNORMAL HIGH (ref 70–99)
Glucose, Bld: 183 mg/dL — ABNORMAL HIGH (ref 70–99)

## 2012-12-06 LAB — POCT I-STAT, CHEM 8
BUN: 11 mg/dL (ref 6–23)
Chloride: 102 mEq/L (ref 96–112)
Glucose, Bld: 100 mg/dL — ABNORMAL HIGH (ref 70–99)
HCT: 48 % — ABNORMAL HIGH (ref 36.0–46.0)
Sodium: 137 mEq/L (ref 135–145)
TCO2: 27 mmol/L (ref 0–100)

## 2012-12-06 LAB — CBC
HCT: 41.7 % (ref 36.0–46.0)
HCT: 43.3 % (ref 36.0–46.0)
Hemoglobin: 14.3 g/dL (ref 12.0–15.0)
Hemoglobin: 15 g/dL (ref 12.0–15.0)
MCH: 29.5 pg (ref 26.0–34.0)
MCV: 86 fL (ref 78.0–100.0)
RBC: 4.85 MIL/uL (ref 3.87–5.11)
RBC: 5.06 MIL/uL (ref 3.87–5.11)
RDW: 15.1 % (ref 11.5–15.5)
WBC: 6.3 10*3/uL (ref 4.0–10.5)

## 2012-12-06 LAB — POCT I-STAT TROPONIN I: Troponin i, poc: 0.01 ng/mL (ref 0.00–0.08)

## 2012-12-06 LAB — PRO B NATRIURETIC PEPTIDE: Pro B Natriuretic peptide (BNP): 349.3 pg/mL — ABNORMAL HIGH (ref 0–125)

## 2012-12-06 MED ORDER — BUDESONIDE 0.5 MG/2ML IN SUSP: 0.5000 mg | Freq: Two times a day (BID) | RESPIRATORY_TRACT | Status: DC

## 2012-12-06 MED ORDER — LORATADINE 10 MG PO TABS
10.0000 mg | ORAL_TABLET | Freq: Every day | ORAL | Status: DC
  Administered 2012-12-06 – 2012-12-09 (×4): 10 mg via ORAL
  Filled 2012-12-06 (×4): qty 1

## 2012-12-06 MED ORDER — CYANOCOBALAMIN 1000 MCG/ML IJ SOLN: 1000.0000 ug | INTRAMUSCULAR | Status: DC

## 2012-12-06 MED ORDER — HEPARIN SODIUM (PORCINE) 5000 UNIT/ML IJ SOLN
5000.0000 [IU] | Freq: Three times a day (TID) | INTRAMUSCULAR | Status: DC
  Administered 2012-12-06 – 2012-12-09 (×10): 5000 [IU] via SUBCUTANEOUS
  Filled 2012-12-06 (×12): qty 1

## 2012-12-06 MED ORDER — VERAPAMIL HCL 180 MG (CO) PO TB24: 180.0000 mg | ORAL_TABLET | Freq: Every day | ORAL | Status: DC

## 2012-12-06 MED ORDER — SODIUM CHLORIDE 0.9 % IJ SOLN
3.0000 mL | Freq: Two times a day (BID) | INTRAMUSCULAR | Status: DC
  Administered 2012-12-06 – 2012-12-09 (×8): 3 mL via INTRAVENOUS

## 2012-12-06 MED ORDER — TIOTROPIUM BROMIDE MONOHYDRATE 18 MCG IN CAPS
18.0000 ug | ORAL_CAPSULE | Freq: Every day | RESPIRATORY_TRACT | Status: DC
  Administered 2012-12-06: 18 ug via RESPIRATORY_TRACT
  Filled 2012-12-06: qty 5

## 2012-12-06 MED ORDER — ALBUTEROL SULFATE (5 MG/ML) 0.5% IN NEBU
2.5000 mg | INHALATION_SOLUTION | RESPIRATORY_TRACT | Status: DC | PRN
  Administered 2012-12-07: 2.5 mg via RESPIRATORY_TRACT
  Filled 2012-12-06: qty 0.5

## 2012-12-06 MED ORDER — HYDROCHLOROTHIAZIDE 25 MG PO TABS
25.0000 mg | ORAL_TABLET | Freq: Every day | ORAL | Status: DC
  Administered 2012-12-06 – 2012-12-09 (×4): 25 mg via ORAL
  Filled 2012-12-06 (×4): qty 1

## 2012-12-06 MED ORDER — MELOXICAM 15 MG PO TABS
15.0000 mg | ORAL_TABLET | Freq: Every day | ORAL | Status: DC
  Administered 2012-12-06 – 2012-12-09 (×4): 15 mg via ORAL
  Filled 2012-12-06 (×4): qty 1

## 2012-12-06 MED ORDER — MOMETASONE FURO-FORMOTEROL FUM 200-5 MCG/ACT IN AERO
2.0000 | INHALATION_SPRAY | Freq: Two times a day (BID) | RESPIRATORY_TRACT | Status: DC
  Administered 2012-12-06: 2 via RESPIRATORY_TRACT
  Filled 2012-12-06: qty 8.8

## 2012-12-06 MED ORDER — IPRATROPIUM BROMIDE 0.02 % IN SOLN
0.5000 mg | Freq: Four times a day (QID) | RESPIRATORY_TRACT | Status: DC
  Administered 2012-12-06 – 2012-12-09 (×14): 0.5 mg via RESPIRATORY_TRACT
  Filled 2012-12-06 (×14): qty 2.5

## 2012-12-06 MED ORDER — METHYLPREDNISOLONE SODIUM SUCC 125 MG IJ SOLR
60.0000 mg | Freq: Three times a day (TID) | INTRAMUSCULAR | Status: DC
  Administered 2012-12-06 – 2012-12-07 (×2): 60 mg via INTRAVENOUS
  Filled 2012-12-06 (×5): qty 0.96

## 2012-12-06 MED ORDER — METHYLPREDNISOLONE SODIUM SUCC 125 MG IJ SOLR
60.0000 mg | Freq: Two times a day (BID) | INTRAMUSCULAR | Status: DC
  Administered 2012-12-06: 60 mg via INTRAVENOUS
  Filled 2012-12-06 (×2): qty 0.96

## 2012-12-06 MED ORDER — CLARITHROMYCIN 500 MG PO TABS
500.0000 mg | ORAL_TABLET | Freq: Two times a day (BID) | ORAL | Status: DC
  Administered 2012-12-06 – 2012-12-09 (×8): 500 mg via ORAL
  Filled 2012-12-06 (×9): qty 1

## 2012-12-06 MED ORDER — BUDESONIDE 0.5 MG/2ML IN SUSP
0.5000 mg | Freq: Two times a day (BID) | RESPIRATORY_TRACT | Status: DC
  Administered 2012-12-06 – 2012-12-08 (×5): 0.5 mg via RESPIRATORY_TRACT
  Filled 2012-12-06 (×6): qty 2

## 2012-12-06 MED ORDER — IPRATROPIUM BROMIDE 0.02 % IN SOLN: 500.0000 ug | RESPIRATORY_TRACT | Status: DC | PRN

## 2012-12-06 MED ORDER — GUAIFENESIN ER 600 MG PO TB12
600.0000 mg | ORAL_TABLET | Freq: Two times a day (BID) | ORAL | Status: DC
  Administered 2012-12-06 – 2012-12-09 (×8): 600 mg via ORAL
  Filled 2012-12-06 (×9): qty 1

## 2012-12-06 MED ORDER — VERAPAMIL HCL ER 180 MG PO TBCR
180.0000 mg | EXTENDED_RELEASE_TABLET | Freq: Every day | ORAL | Status: DC
  Administered 2012-12-06 – 2012-12-08 (×4): 180 mg via ORAL
  Filled 2012-12-06 (×5): qty 1

## 2012-12-06 MED ORDER — ALBUTEROL SULFATE (5 MG/ML) 0.5% IN NEBU: 2.5000 mg | INHALATION_SOLUTION | RESPIRATORY_TRACT | Status: AC | PRN

## 2012-12-06 MED ORDER — ALBUTEROL SULFATE (5 MG/ML) 0.5% IN NEBU
10.0000 mg | INHALATION_SOLUTION | Freq: Once | RESPIRATORY_TRACT | Status: AC
  Administered 2012-12-06: 10 mg via RESPIRATORY_TRACT
  Filled 2012-12-06: qty 2

## 2012-12-06 MED ORDER — ALPRAZOLAM 0.25 MG PO TABS
0.2500 mg | ORAL_TABLET | Freq: Every evening | ORAL | Status: DC | PRN
  Administered 2012-12-06: 0.25 mg via ORAL

## 2012-12-06 MED ORDER — WHITE PETROLATUM GEL
Status: AC
  Administered 2012-12-06: 1
  Filled 2012-12-06: qty 5

## 2012-12-06 MED ORDER — POTASSIUM CHLORIDE CRYS ER 20 MEQ PO TBCR
20.0000 meq | EXTENDED_RELEASE_TABLET | Freq: Every day | ORAL | Status: DC
  Administered 2012-12-06 – 2012-12-09 (×4): 20 meq via ORAL
  Filled 2012-12-06 (×4): qty 1

## 2012-12-06 MED ORDER — ALBUTEROL SULFATE (5 MG/ML) 0.5% IN NEBU
2.5000 mg | INHALATION_SOLUTION | Freq: Four times a day (QID) | RESPIRATORY_TRACT | Status: DC
  Administered 2012-12-06 – 2012-12-09 (×15): 2.5 mg via RESPIRATORY_TRACT
  Filled 2012-12-06 (×15): qty 0.5

## 2012-12-06 NOTE — H&P (Signed)
[facility] History and Physical  CAMBRIA OSTEN OZH:[id number] DOB: [birth year] DOA: 12/05/2012  Referring physician: ED PCP: Samuel Jester, DO  Chief Complaint: COPD exacerbation  HPI: Shawna [name] is a 67 y.o. female with PMH of fairly severe COPD, although she is only intermittently on home O2, s/p lung resection for what was thought to be lung cancer but ended up being a benign mass.  Patient began to feel unwell on 9/17, on 9/18 she was started on clarithromycin due to worsening SOB, and then finally on the 19th the patient called her pulmonologist with c/o worsening SOB, pulmonologist sent the patient to the ED for evaluation.  In the ED patient was noted to be in respiratory distress with severe wheezing, this was finally improved after solumedrol and an hour long breathing treatment.  Review of Systems: 12 systems reviewed and otherwise negative.  Past Medical History  Diagnosis Date  . COPD (chronic obstructive pulmonary disease)   . HTN (hypertension)   . B12 deficiency   . OA (osteoarthritis)   . Hypokalemia   . Mass of lung   . GERD (gastroesophageal reflux disease)   . Diverticulosis of colon (without mention of hemorrhage)   . 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 Pulmonary nodule    Past Surgical History  Procedure Laterality Date  . Carpal tunnel release      bilateral  . Tubal ligation      x 2  . Cervical spine surgery     Social History:  reports that she quit smoking about 17 months ago. She has never used smokeless tobacco. She reports that she does not drink alcohol or use illicit drugs.   Allergies  Allergen Reactions  . Percodan [Oxycodone-Aspirin] Other (See Comments)    Reaction unknown    Family History  Problem Relation Age of Onset  . Coronary artery disease Mother 76  . Alzheimer's disease Mother   . Colon cancer      Prior to Admission medications   Medication Sig Start Date End Date Taking? Authorizing  Provider  albuterol (PROVENTIL HFA;VENTOLIN HFA) 108 (90 BASE) MCG/ACT inhaler Inhale 2 puffs into the lungs every 4 (four) hours as needed. For shortness of breath 02/08/12  Yes Shanker Levora Dredge, MD  albuterol (PROVENTIL) (2.5 MG/3ML) 0.083% nebulizer solution Take 2.5 mg by nebulization 4 (four) times daily. For shortness of breath 02/08/12  Yes Shanker Levora Dredge, MD  ALPRAZolam [name]) 0.25 MG tablet Take 0.25 mg by mouth at bedtime as needed for sleep.    Yes Historical Provider, MD  budesonide (PULMICORT) 0.5 MG/2ML nebulizer solution Take 2 mLs (0.5 mg total) by nebulization 2 (two) times daily. DX 496 06/26/12  Yes Oretha Milch, MD  clarithromycin (BIAXIN) 500 MG tablet Take 500 mg by mouth 2 (two) times daily.   Yes Historical Provider, MD  cyanocobalamin (,VITAMIN B-12,) 1000 MCG/ML injection Inject 1,000 mcg into the muscle every 30 (thirty) days.   Yes Historical Provider, MD  Fluticasone-Salmeterol (ADVAIR) 500-50 MCG/DOSE AEPB Inhale 1 puff into the lungs 2 (two) times daily.    Yes Historical Provider, MD  guaiFENesin (MUCINEX) 600 MG 12 hr tablet Take 600 mg by mouth 2 (two) times daily.  02/08/12  Yes Shanker Levora Dredge, MD  hydrochlorothiazide (HYDRODIURIL) 25 MG tablet Take 25 mg by mouth daily.   Yes Historical Provider, MD  ipratropium (ATROVENT) 0.02 % nebulizer solution Take 2.5 mLs (500 mcg total) by nebulization every 4 (four) hours as  needed for wheezing. 02/08/12  Yes Shanker Levora Dredge, MD  KLOR-CON M20 20 MEQ tablet Take 20 mEq by mouth daily.  05/11/11  Yes Historical Provider, MD  loratadine (CLARITIN) 10 MG tablet Take 1 tablet (10 mg total) by mouth daily. 02/08/12  Yes Shanker Levora Dredge, MD  meloxicam (MOBIC) 15 MG tablet Take 15 mg by mouth daily.    Yes Historical Provider, MD  predniSONE (DELTASONE) 10 MG tablet Take 10 mg by mouth daily. 10 mg daily 05/06/12  Yes Tammy S Parrett, NP  tiotropium (SPIRIVA HANDIHALER) 18 MCG inhalation capsule Place 1 capsule (18 mcg  total) into inhaler and inhale daily. 02/08/12  Yes Shanker Levora Dredge, MD  verapamil (COVERA HS) 180 MG (CO) 24 hr tablet Take 180 mg by mouth at bedtime.   Yes Historical Provider, MD   Physical Exam: Filed Vitals:   12/06/12 0224  BP: 155/108  Pulse: 115  Temp:   Resp: 25    General:  NAD, resting comfortably in bed Eyes: PEERLA EOMI ENT: mucous membranes moist Neck: supple w/o JVD Cardiovascular: RRR w/o MRG Respiratory: severe wheezing noted in all lung fields Abdomen: soft, nt, nd, bs+ Skin: no rash nor lesion Musculoskeletal: MAE, full ROM all 4 extremities Psychiatric: normal tone and affect Neurologic: AAOx3, grossly non-focal  Labs on Admission:  Basic Metabolic Panel:  Recent Labs Lab 12/06/12 0004  NA 137  K 3.8  CL 102  GLUCOSE 100*  BUN 11  CREATININE 1.10   Liver Function Tests: No results found for this basename: AST, ALT, ALKPHOS, BILITOT, PROT, ALBUMIN,  in the last 168 hours No results found for this basename: LIPASE, AMYLASE,  in the last 168 hours No results found for this basename: AMMONIA,  in the last 168 hours CBC:  Recent Labs Lab 12/05/12 2359 12/06/12 0004  WBC 9.6  --   HGB 15.0 16.3*  HCT 43.3 48.0*  MCV 85.6  --   PLT 175  --    Cardiac Enzymes: No results found for this basename: CKTOTAL, CKMB, CKMBINDEX, TROPONINI,  in the last 168 hours  BNP (last 3 results)  Recent Labs  02/06/12 0615 12/05/12 2359  PROBNP 1979.0* 349.3*   CBG: No results found for this basename: GLUCAP,  in the last 168 hours  Radiological Exams on Admission: Dg Chest 2 View  12/05/2012   CLINICAL DATA:  Increased shortness of  breath; COPD  EXAM: CHEST  2 VIEW  COMPARISON:  Chest radiograph April 29, 2012 and chest CT Aug 06, 2012  FINDINGS: There is postoperative change on the right. There is scarring with chronic blunting of the right costophrenic angle which is stable. There is underlying emphysema.  There is no edema or consolidation.  Heart size is normal. Pulmonary vascularity reflects underlying emphysema. No adenopathy. There is thoracic levoscoliosis. There is postoperative change in the lower cervical spine.  IMPRESSION: Scarring on the right. Underlying emphysema. No edema or consolidation.   Electronically Signed   By: Bretta Bang   On: 12/05/2012 20:27    EKG: Independently reviewed.  Assessment/Plan Active Problems:   COPD (chronic obstructive pulmonary disease)   COPD exacerbation   Acute-on-chronic respiratory failure   1. COPD exacerbation, Acute on chronic respiratory failure - Patient improved after breathing treatment and steroids, on adult wheeze protocol, continue home neb treatments, solumedrol 60 q 12h IV ordered.  Tele monitor, contunious pulse ox, 2L o2 via [place], and admitted to stepdown.    Code Status: Full Code (  must indicate code status--if unknown or must be presumed, indicate so) Family Communication: Daughters at bedside (indicate person spoken with, if applicable, with phone number if by telephone) Disposition Plan: Admit to inpatient (indicate anticipated LOS)  Time spent: 70 min  Carston Riedl M. [facility] Pager [phone]  If 7PM-7AM, please contact night-coverage [email or web address] Password [email or web address] 12/06/2012, 3:03 AM

## 2012-12-06 NOTE — ED Provider Notes (Signed)
CSN: [id number]     Arrival date & time 12/05/12  1939 History   First MD Initiated Contact with Patient 12/05/12 2354     Chief Complaint  Patient presents with  . Shortness of Breath   (Consider location/radiation/quality/duration/timing/severity/associated sxs/prior Treatment) HPI History provided by patient and family bedside. Has history of COPD and despite inhaler medications at home having worsening symptoms over the last few hours. No fevers. Has ongoing persistent dry cough, Somewhat worse since yesterday. No leg pain or leg swelling. No known sick contacts. No rash. Denies any chest pain or chest tightness. Patient continues to smoke cigarettes. Was started on antibiotics yesterday by primary care physician. Symptoms moderate to severe and worse with exertion. Past Medical History  Diagnosis Date  . COPD (chronic obstructive pulmonary disease)   . HTN (hypertension)   . B12 deficiency   . OA (osteoarthritis)   . Hypokalemia   . Mass of lung   . GERD (gastroesophageal reflux disease)   . Diverticulosis of colon (without mention of hemorrhage)   . 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 Pulmonary nodule    Past Surgical History  Procedure Laterality Date  . Carpal tunnel release      bilateral  . Tubal ligation      x 2  . Cervical spine surgery     Family History  Problem Relation Age of Onset  . Coronary artery disease Mother 65  . Alzheimer's disease Mother   . Colon cancer     History  Substance Use Topics  . Smoking status: Current Every Day Smoker -- 0.30 packs/day for 40 years    Last Attempt to Quit: 05/18/2011  . Smokeless tobacco: Never Used  . Alcohol Use: No   OB History   Grav Para Term Preterm Abortions TAB SAB Ect Mult Living                 Review of Systems  Constitutional: Negative for fever and chills.  HENT: Negative for neck pain and neck stiffness.   Eyes: Negative for visual disturbance.  Respiratory: Positive for  shortness of breath and wheezing.   Cardiovascular: Negative for palpitations and leg swelling.  Gastrointestinal: Negative for abdominal pain.  Genitourinary: Negative for dysuria.  Musculoskeletal: Negative for back pain.  Skin: Negative for rash.  Neurological: Negative for headaches.  All other systems reviewed and are negative.    Allergies  Percodan  Home Medications   Current Outpatient Rx  Name  Route  Sig  Dispense  Refill  . albuterol (PROVENTIL HFA;VENTOLIN HFA) 108 (90 BASE) MCG/ACT inhaler   Inhalation   Inhale 2 puffs into the lungs every 4 (four) hours as needed. For shortness of breath         . albuterol (PROVENTIL) (2.5 MG/3ML) 0.083% nebulizer solution   Nebulization   Take 2.5 mg by nebulization 4 (four) times daily. For shortness of breath         . ALPRAZolam (XANAX) 0.25 MG tablet   Oral   Take 0.25 mg by mouth at bedtime as needed for sleep.          . budesonide (PULMICORT) 0.5 MG/2ML nebulizer solution   Nebulization   Take 2 mLs (0.5 mg total) by nebulization 2 (two) times daily. DX 496   120 mL   5   . clarithromycin (BIAXIN) 500 MG tablet   Oral   Take 500 mg by mouth 2 (two) times daily.         Marland Kitchen  cyanocobalamin (,VITAMIN B-12,) 1000 MCG/ML injection   Intramuscular   Inject 1,000 mcg into the muscle every 30 (thirty) days.         . Fluticasone-Salmeterol (ADVAIR) 500-50 MCG/DOSE AEPB   Inhalation   Inhale 1 puff into the lungs 2 (two) times daily.          Marland Kitchen guaiFENesin (MUCINEX) 600 MG 12 hr tablet   Oral   Take 600 mg by mouth 2 (two) times daily.          . hydrochlorothiazide (HYDRODIURIL) 25 MG tablet   Oral   Take 25 mg by mouth daily.         Marland Kitchen ipratropium (ATROVENT) 0.02 % nebulizer solution   Nebulization   Take 2.5 mLs (500 mcg total) by nebulization every 4 (four) hours as needed for wheezing.   75 mL      . KLOR-CON M20 20 MEQ tablet   Oral   Take 20 mEq by mouth daily.          Marland Kitchen  loratadine (CLARITIN) 10 MG tablet   Oral   Take 1 tablet (10 mg total) by mouth daily.   30 tablet   0   . meloxicam (MOBIC) 15 MG tablet   Oral   Take 15 mg by mouth daily.          . predniSONE (DELTASONE) 10 MG tablet   Oral   Take 10 mg by mouth daily. 10 mg daily         . tiotropium (SPIRIVA HANDIHALER) 18 MCG inhalation capsule   Inhalation   Place 1 capsule (18 mcg total) into inhaler and inhale daily.   30 capsule   12   . verapamil (COVERA HS) 180 MG (CO) 24 hr tablet   Oral   Take 180 mg by mouth at bedtime.          BP 144/97  Pulse 99  Temp(Src) 98.3 F (36.8 C) (Oral)  Resp 23  SpO2 97% Physical Exam  Constitutional: She is oriented to person, place, and time. She appears well-developed and well-nourished.  HENT:  Head: Normocephalic and atraumatic.  Eyes: EOM are normal. Pupils are equal, round, and reactive to light.  Neck: Neck supple.  Cardiovascular: Regular rhythm and intact distal pulses.   Tachycardic  Pulmonary/Chest:  Tachypnea with increased work of breathing and moderate respiratory distress. Bilateral inspiratory and exp wheezes  Abdominal: Soft. She exhibits no distension. There is no tenderness.  Musculoskeletal: Normal range of motion. She exhibits no tenderness.  Neurological: She is alert and oriented to person, place, and time.  Skin: Skin is warm and dry.    ED Course  Procedures (including critical care time) Labs Review Labs Reviewed  PRO B NATRIURETIC PEPTIDE - Abnormal; Notable for the following:    Pro B Natriuretic peptide (BNP) 349.3 (*)    All other components within normal limits  POCT I-STAT, CHEM 8 - Abnormal; Notable for the following:    Glucose, Bld 100 (*)    Hemoglobin 16.3 (*)    HCT 48.0 (*)    All other components within normal limits  CBC  POCT I-STAT TROPONIN I   Imaging Review Dg Chest 2 View  12/05/2012   CLINICAL DATA:  Increased shortness of  breath; COPD  EXAM: CHEST  2 VIEW  COMPARISON:   Chest radiograph April 29, 2012 and chest CT Aug 06, 2012  FINDINGS: There is postoperative change on the right. There is scarring with  chronic blunting of the right costophrenic angle which is stable. There is underlying emphysema.  There is no edema or consolidation. Heart size is normal. Pulmonary vascularity reflects underlying emphysema. No adenopathy. There is thoracic levoscoliosis. There is postoperative change in the lower cervical spine.  IMPRESSION: Scarring on the right. Underlying emphysema. No edema or consolidation.   Electronically Signed   By: Bretta Bang   On: 12/05/2012 20:27   CRITICAL CARE Performed by: Sunnie Nielsen Total critical care time: 35 Critical care time was exclusive of separately billable procedures and treating other patients. Critical care was necessary to treat or prevent imminent or life-threatening deterioration. Critical care was time spent personally by me on the following activities: development of treatment plan with patient and/or surrogate as well as nursing, discussions with consultants, evaluation of patient's response to treatment, examination of patient, obtaining history from patient or surrogate, ordering and performing treatments and interventions, ordering and review of laboratory studies, ordering and review of radiographic studies, pulse oximetry and re-evaluation of patient's condition.serial evaluations, continuous albuterol neb, d/w Dr Julian Reil and will admit step down unit, has O2 requirement   Date: 12/06/2012  Rate: 104  Rhythm: sinus tachycardia  QRS Axis: normal  Intervals: normal  ST/T Wave abnormalities: nonspecific ST changes  Conduction Disutrbances:none  Narrative Interpretation:   Old EKG Reviewed: none available  12:55 AM Dr Julian Reil to admit  MDM  Diagnosis acute COPD exacerbation IV steroids and continuous albuterol neb EKG, labs, chest x-ray Admit step down unit   Sunnie Nielsen, MD 12/07/12 [phone]

## 2012-12-06 NOTE — Progress Notes (Signed)
I took the patient off the CAT at this time and switched back to 2 lpm [place]. Patient currently on 2 lpm [place] with o2 sats 100%, HR 115, RR 25 and BP 155/108. Patient has more air movement with only expiratory wheezes throughout at this time. I assessed the patient and scheduled her albuterol Q6 treatments per her home regimen. Patient also has albuterol Q2 PRN treatments if needed. RT and RN waiting for further instructions/Orders from MD. RT will continue to assist as needed.

## 2012-12-06 NOTE — Progress Notes (Signed)
[facility] Progress Note [place] TEAM 1 - Stepdown/ICU TEAM   CORRISSA MARTELLO ZOX:[id number] DOB: 29-May-1945 DOA: 12/05/2012 PCP: Samuel Jester, DO  Admit HPI / Brief Narrative: 67 y.o. female with PMH of COPD intermittently on home O2, s/p lung resection for what was thought to be lung cancer but ended up being a benign mass. Patient began to feel unwell on 9/17.  On 9/18 she was started on clarithromycin due to worsening SOB, and then finally on 9/19 the patient called her Pulmonologist with c/o worsening SOB and was sent to the ED for evaluation. In the ED patient was noted to be in respiratory distress with severe wheezing.  Assessment/Plan:  Acute bronchospastic COPD exacerbation  Hypertension  Status post resection benign lung mass  Code Status: FULL Family Communication: no family present at time of exam Disposition Plan: SDU  Consultants: None  Procedures: None  Antibiotics: Clarithromycin 9/19 >>  DVT prophylaxis: SQ heparin  HPI/Subjective: Patient seen for followup visit  Objective: Blood pressure 117/87, pulse 80, temperature 98 F (36.7 C), temperature source Oral, resp. rate 17, height 5\' 6"  (1.676 m), weight 52.9 kg (116 lb 10 oz), SpO2 99.00%.  Intake/Output Summary (Last 24 hours) at 12/06/12 1206 Last data filed at 12/06/12 0330  Gross per 24 hour  Intake    243 ml  Output    400 ml  Net   -157 ml   Exam: Followup exam completed  Data Reviewed: Basic Metabolic Panel:  Recent Labs Lab 12/06/12 0004 12/06/12 0545 12/06/12 1018  NA 137 135 132*  K 3.8 3.4* 3.6  CL 102 95* 92*  CO2  --  28 27  GLUCOSE 100* 136* 183*  BUN 11 12 14   CREATININE 1.10 0.72 0.73  CALCIUM  --  10.0 9.6   Liver Function Tests: No results found for this basename: AST, ALT, ALKPHOS, BILITOT, PROT, ALBUMIN,  in the last 168 hours  CBC:  Recent Labs Lab 12/05/12 2359 12/06/12 0004 12/06/12 0545  WBC 9.6  --  6.3  HGB 15.0 16.3* 14.3  HCT  43.3 48.0* 41.7  MCV 85.6  --  86.0  PLT 175  --  173    Recent Results (from the past 240 hour(s))  MRSA PCR SCREENING     Status: None   Collection Time    12/06/12  1:38 AM      Result Value Range Status   MRSA by PCR NEGATIVE  NEGATIVE Final   Comment:            The GeneXpert MRSA Assay (FDA     approved for NASAL specimens     only), is one component of a     comprehensive MRSA colonization     surveillance program. It is not     intended to diagnose MRSA     infection nor to guide or     monitor treatment for     MRSA infections.     Studies:  Recent x-ray studies have been reviewed in detail by the Attending Physician  Scheduled Meds:  Scheduled Meds: . albuterol  2.5 mg Nebulization Q6H  . budesonide  0.5 mg Nebulization BID  . clarithromycin  500 mg Oral BID  . [START ON 12/27/2012] cyanocobalamin  1,000 mcg Intramuscular Q30 days  . guaiFENesin  600 mg Oral BID  . heparin  5,000 Units Subcutaneous Q8H  . hydrochlorothiazide  25 mg Oral Daily  . loratadine  10 mg Oral Daily  .  meloxicam  15 mg Oral Daily  . methylPREDNISolone (SOLU-MEDROL) injection  60 mg Intravenous Q12H  . mometasone-formoterol  2 puff Inhalation BID  . potassium chloride SA  20 mEq Oral Daily  . sodium chloride  3 mL Intravenous Q12H  . tiotropium  18 mcg Inhalation Daily  . verapamil  180 mg Oral QHS    Time spent on care of this patient: 25+ mins   [facility] T  [facility] Office  [phone] Pager - Text Page per Loretha Stapler as per below:  On-Call/Text Page:      [email or web address]      password [email or web address]  If 7PM-7AM, please contact night-coverage [email or web address] Password [email or web address] 12/06/2012, 12:06 PM   LOS: 1 day

## 2012-12-07 MED ORDER — METHYLPREDNISOLONE SODIUM SUCC 40 MG IJ SOLR
40.0000 mg | Freq: Two times a day (BID) | INTRAMUSCULAR | Status: AC
  Administered 2012-12-07 – 2012-12-08 (×3): 40 mg via INTRAVENOUS
  Filled 2012-12-07 (×4): qty 1

## 2012-12-07 MED ORDER — SALINE SPRAY 0.65 % NA SOLN
1.0000 | NASAL | Status: DC | PRN
  Administered 2012-12-07: 1 via NASAL
  Filled 2012-12-07: qty 44

## 2012-12-07 MED ORDER — CLONAZEPAM 0.5 MG PO TABS
0.2500 mg | ORAL_TABLET | Freq: Two times a day (BID) | ORAL | Status: DC
  Administered 2012-12-07 – 2012-12-08 (×3): 0.25 mg via ORAL
  Filled 2012-12-07 (×3): qty 1

## 2012-12-07 NOTE — Progress Notes (Signed)
[facility] Progress Note [place] TEAM 1 - Stepdown/ICU TEAM   Shawna [name] ZOX:[id number] DOB: Jul 25, 1945 DOA: 12/05/2012 PCP: Samuel Jester, DO  Admit HPI / Brief Narrative: 67 y.o. female with PMH of COPD intermittently on home O2, s/p lung resection for what was thought to be lung cancer but ended up being a benign mass. Patient began to feel unwell on 9/17.  On 9/18 she was started on clarithromycin due to worsening SOB, and then finally on 9/19 the patient called her Pulmonologist with c/o worsening SOB and was sent to the ED for evaluation. In the ED patient was noted to be in respiratory distress with severe wheezing.  Assessment/Plan:  Acute bronchospastic COPD exacerbation Last wheezing today but patient continues to complain of severe dyspnea - continue current treatment plan - if symptoms not improved further by a.m. we'll consult her pulmonologist - add low-dose scheduled Klonopin as anxiety appears to be contributing  Hypertension Well-controlled at this time  Status post resection benign lung mass  Code Status: FULL Family Communication: no family present at time of exam Disposition Plan: SDU  Consultants: None  Procedures: None  Antibiotics: Clarithromycin 9/19 >>  DVT prophylaxis: SQ heparin  HPI/Subjective: Patient was feeling much better this morning until she attempted to move at which time she became markedly dyspneic.  She is sitting up at the side of the bed complaining of severe dyspnea at the time of my exam.  She denies chest pain fevers chills nausea vomiting or abdominal pain.  Objective: Blood pressure 109/66, pulse 85, temperature 98.1 F (36.7 C), temperature source Axillary, resp. rate 21, height 5\' 6"  (1.676 m), weight 53.8 kg (118 lb 9.7 oz), SpO2 97.00%.  Intake/Output Summary (Last 24 hours) at 12/07/12 1026 Last data filed at 12/07/12 0408  Gross per 24 hour  Intake    473 ml  Output    600 ml  Net   -127 ml    Exam: General: Appears uncomfortable in regard to respirations but not in extremis Lungs: Faint diffuse wheeze with no crackles with improved air movement since yesterday Cardiovascular: Regular rate and rhythm without murmur gallop or rub normal S1 and S2 Abdomen: Nontender, nondistended, soft, bowel sounds positive, no rebound, no ascites, no appreciable mass Extremities: No significant cyanosis, clubbing, or edema bilateral lower extremities   Data Reviewed: Basic Metabolic Panel:  Recent Labs Lab 12/06/12 0004 12/06/12 0545 12/06/12 1018  NA 137 135 132*  K 3.8 3.4* 3.6  CL 102 95* 92*  CO2  --  28 27  GLUCOSE 100* 136* 183*  BUN 11 12 14   CREATININE 1.10 0.72 0.73  CALCIUM  --  10.0 9.6   CBC:  Recent Labs Lab 12/05/12 2359 12/06/12 0004 12/06/12 0545  WBC 9.6  --  6.3  HGB 15.0 16.3* 14.3  HCT 43.3 48.0* 41.7  MCV 85.6  --  86.0  PLT 175  --  173    Recent Results (from the past 240 hour(s))  MRSA PCR SCREENING     Status: None   Collection Time    12/06/12  1:38 AM      Result Value Range Status   MRSA by PCR NEGATIVE  NEGATIVE Final   Comment:            The GeneXpert MRSA Assay (FDA     approved for NASAL specimens     only), is one component of a     comprehensive MRSA colonization  surveillance program. It is not     intended to diagnose MRSA     infection nor to guide or     monitor treatment for     MRSA infections.     Studies:  Recent x-ray studies have been reviewed in detail by the Attending Physician  Scheduled Meds:  Scheduled Meds: . albuterol  2.5 mg Nebulization Q6H  . budesonide  0.5 mg Nebulization BID  . clarithromycin  500 mg Oral BID  . [START ON 12/27/2012] cyanocobalamin  1,000 mcg Intramuscular Q30 days  . guaiFENesin  600 mg Oral BID  . heparin  5,000 Units Subcutaneous Q8H  . hydrochlorothiazide  25 mg Oral Daily  . ipratropium  0.5 mg Nebulization Q6H  . loratadine  10 mg Oral Daily  . meloxicam  15 mg  Oral Daily  . methylPREDNISolone (SOLU-MEDROL) injection  60 mg Intravenous Q8H  . potassium chloride SA  20 mEq Oral Daily  . sodium chloride  3 mL Intravenous Q12H  . verapamil  180 mg Oral QHS    Time spent on care of this patient: 35 mins   [facility] T  [facility] Office  [phone] Pager - Text Page per Loretha Stapler as per below:  On-Call/Text Page:      [email or web address]      password [email or web address]  If 7PM-7AM, please contact night-coverage [email or web address] Password [email or web address] 12/07/2012, 10:26 AM   LOS: 2 days

## 2012-12-08 MED ORDER — BUDESONIDE 0.5 MG/2ML IN SUSP
0.5000 mg | Freq: Two times a day (BID) | RESPIRATORY_TRACT | Status: DC
  Administered 2012-12-08 – 2012-12-09 (×3): 0.5 mg via RESPIRATORY_TRACT
  Filled 2012-12-08 (×4): qty 2

## 2012-12-08 MED ORDER — CLONAZEPAM 0.5 MG PO TABS
0.5000 mg | ORAL_TABLET | Freq: Two times a day (BID) | ORAL | Status: DC
  Administered 2012-12-08 – 2012-12-09 (×2): 0.5 mg via ORAL
  Filled 2012-12-08 (×2): qty 1

## 2012-12-08 MED ORDER — PREDNISONE 20 MG PO TABS
20.0000 mg | ORAL_TABLET | Freq: Two times a day (BID) | ORAL | Status: DC
  Administered 2012-12-09 (×2): 20 mg via ORAL
  Filled 2012-12-08 (×3): qty 1

## 2012-12-08 NOTE — Progress Notes (Signed)
[facility] Progress Note [place] TEAM 1 - Stepdown/ICU TEAM   Shawna [name] ZOX:[id number] DOB: Oct 06, 1945 DOA: 12/05/2012 PCP: Samuel Jester, DO  Admit HPI / Brief Narrative: 67 y.o. female with PMH of COPD intermittently on home O2, s/p lung resection for what was thought to be lung cancer but ended up being a benign mass. Patient began to feel unwell on 9/17.  On 9/18 she was started on clarithromycin due to worsening SOB, and then finally on 9/19 the patient called her Pulmonologist with c/o worsening SOB and was sent to the ED for evaluation. In the ED patient was noted to be in respiratory distress with severe wheezing.  Assessment/Plan:  Acute bronchospastic COPD exacerbation Very slowly improving - has made some progress over last 24hrs - added low-dose scheduled Klonopin as anxiety appears to be contributing - add flutter valve - possible D/C in next 24hrs   Hypertension Well-controlled at this time  Status post resection benign lung mass  Code Status: FULL Family Communication: no family present at time of exam Disposition Plan: SDU  Consultants: None  Procedures: None  Antibiotics: Clarithromycin 9/19 >>  DVT prophylaxis: SQ heparin  HPI/Subjective: Patient is making slow but gradual progress.  She continues to wheeze today but does admit that she feels less dyspneic than yesterday.  She has no new complaints.  She denies chest pain.  Objective: Blood pressure 144/86, pulse 78, temperature 97.9 F (36.6 C), temperature source Oral, resp. rate 20, height 5\' 6"  (1.676 m), weight 53.5 kg (117 lb 15.1 oz), SpO2 98.00%.  Intake/Output Summary (Last 24 hours) at 12/08/12 1500 Last data filed at 12/08/12 1100  Gross per 24 hour  Intake    566 ml  Output   1450 ml  Net   -884 ml   Exam: General: More comfortable with relaxed respirations today Lungs: Faint diffuse wheeze with no crackles with improved air movement since yesterday again  today Cardiovascular: Regular rate and rhythm without murmur gallop or rub normal S1 and S2 Abdomen: Nontender, nondistended, soft, bowel sounds positive, no rebound, no ascites, no appreciable mass Extremities: No significant cyanosis, clubbing, or edema bilateral lower extremities   Data Reviewed: Basic Metabolic Panel:  Recent Labs Lab 12/06/12 0004 12/06/12 0545 12/06/12 1018  NA 137 135 132*  K 3.8 3.4* 3.6  CL 102 95* 92*  CO2  --  28 27  GLUCOSE 100* 136* 183*  BUN 11 12 14   CREATININE 1.10 0.72 0.73  CALCIUM  --  10.0 9.6   CBC:  Recent Labs Lab 12/05/12 2359 12/06/12 0004 12/06/12 0545  WBC 9.6  --  6.3  HGB 15.0 16.3* 14.3  HCT 43.3 48.0* 41.7  MCV 85.6  --  86.0  PLT 175  --  173    Recent Results (from the past 240 hour(s))  MRSA PCR SCREENING     Status: None   Collection Time    12/06/12  1:38 AM      Result Value Range Status   MRSA by PCR NEGATIVE  NEGATIVE Final   Comment:            The GeneXpert MRSA Assay (FDA     approved for NASAL specimens     only), is one component of a     comprehensive MRSA colonization     surveillance program. It is not     intended to diagnose MRSA     infection nor to guide or     monitor  treatment for     MRSA infections.     Studies:  Recent x-ray studies have been reviewed in detail by the Attending Physician  Scheduled Meds:  Scheduled Meds: . albuterol  2.5 mg Nebulization Q6H  . budesonide  0.5 mg Nebulization BID  . clarithromycin  500 mg Oral BID  . clonazePAM  0.25 mg Oral BID  . [START ON 12/27/2012] cyanocobalamin  1,000 mcg Intramuscular Q30 days  . guaiFENesin  600 mg Oral BID  . heparin  5,000 Units Subcutaneous Q8H  . hydrochlorothiazide  25 mg Oral Daily  . ipratropium  0.5 mg Nebulization Q6H  . loratadine  10 mg Oral Daily  . meloxicam  15 mg Oral Daily  . methylPREDNISolone (SOLU-MEDROL) injection  40 mg Intravenous Q12H  . potassium chloride SA  20 mEq Oral Daily  . sodium  chloride  3 mL Intravenous Q12H  . verapamil  180 mg Oral QHS    Time spent on care of this patient: 25 mins   [facility] T  [facility] Office  [phone] Pager - Text Page per Loretha Stapler as per below:  On-Call/Text Page:      [email or web address]      password [email or web address]  If 7PM-7AM, please contact night-coverage [email or web address] Password [email or web address] 12/08/2012, 3:00 PM   LOS: 3 days

## 2012-12-08 NOTE — Evaluation (Signed)
Physical Therapy Evaluation Patient Details Name: Shawna [name] MRN: [id number] DOB: 15-Jan-1946 Today's Date: 12/08/2012 Time: 4098-1191 PT Time Calculation (min): 16 min  PT Assessment / Plan / Recommendation History of Present Illness  Pt presents with acute on chronic COPD exacerbation.  Clinical Impression  Pt presents with normal strength and balance and functional mobility. Modified independent with activity but with very poor activity tolerance. Ambulated 250', HR up to 140 bpm, O2 sats mostly in upper 90's, occ down to upper 80%. Educated her on energy conservation techniques. No further acute PT needs, PT signing off.    PT Assessment  Patent does not need any further PT services    Follow Up Recommendations  No PT follow up    Does the patient have the potential to tolerate intense rehabilitation      Barriers to Discharge        Equipment Recommendations  None recommended by PT    Recommendations for Other Services     Frequency      Precautions / Restrictions Restrictions Weight Bearing Restrictions: No   Pertinent Vitals/Pain No c/o pain      Mobility  Bed Mobility Bed Mobility: Supine to Sit Supine to Sit: 7: Independent Transfers Transfers: Sit to Stand;Stand to Sit Sit to Stand: 7: Independent Stand to Sit: 7: Independent Ambulation/Gait Ambulation/Gait Assistance: 6: Modified independent (Device/Increase time) Ambulation Distance (Feet): 250 Feet Assistive device: None Ambulation/Gait Assistance Details: pt ambulates with normal gait pattern, no instability or LOB Gait Pattern: Within Functional Limits Gait velocity: WFL General Gait Details: HR up to 140's with ambulation, O2 sats 88-97% on 2 L O2 Stairs: No Wheelchair Mobility Wheelchair Mobility: No    Exercises Other Exercises Other Exercises: discussed energy conservation techniques   PT Diagnosis:    PT Problem List:   PT Treatment Interventions:       PT Goals(Current goals  can be found in the care plan section) Acute Rehab PT Goals Patient Stated Goal: return home quickly PT Goal Formulation: No goals set, d/c therapy  Visit Information  Last PT Received On: 12/08/12 Assistance Needed: +1 History of Present Illness: Pt presents with acute on chronic COPD exacerbation.       Prior Functioning  Home Living Family/patient expects to be discharged to:: Private residence Living Arrangements: Alone Available Help at Discharge: Family;Available PRN/intermittently Type of Home: House Home Access: Stairs to enter [employer] of Steps: 4 Entrance Stairs-Rails: Right Home Layout: One level Home Equipment: Other (comment) (oxygen at night and PRN) Additional Comments: pt drives, gets own groceries Prior Function Level of Independence: Independent Communication Communication: No difficulties    Cognition  Cognition Arousal/Alertness: Awake/alert Behavior During Therapy: WFL for tasks assessed/performed Overall Cognitive Status: Within Functional Limits for tasks assessed    Extremity/Trunk Assessment Upper Extremity Assessment Upper Extremity Assessment: Overall WFL for tasks assessed Lower Extremity Assessment Lower Extremity Assessment: Overall WFL for tasks assessed Cervical / Trunk Assessment Cervical / Trunk Assessment: Normal   Balance Balance Balance Assessed: Yes Dynamic Standing Balance Dynamic Standing - Balance Support: No upper extremity supported;During functional activity Dynamic Standing - Level of Assistance: 6: Modified independent (Device/Increase time)  End of Session PT - End of Session Equipment Utilized During Treatment: Gait belt;Oxygen Activity Tolerance: Patient limited by fatigue Patient left: in bed;with call bell/phone within reach Nurse Communication: Mobility status  GP   Lyanne Co, PT  Acute Rehab Services  [phone]   Lyanne Co 12/08/2012, 3:23 PM

## 2012-12-08 NOTE — Progress Notes (Signed)
Pt states she will have daughter bring in flutter from home.Pt does not want to be charged for flutter at hospital when she already has one at home. Encouraged pt to let RT know if family is unable to bring it.

## 2012-12-09 DIAGNOSIS — J449 Chronic obstructive pulmonary disease, unspecified: Secondary | ICD-10-CM

## 2012-12-09 LAB — BASIC METABOLIC PANEL
CO2: 30 mEq/L (ref 19–32)
Calcium: 9.5 mg/dL (ref 8.4–10.5)
Chloride: 90 mEq/L — ABNORMAL LOW (ref 96–112)
Creatinine, Ser: 0.79 mg/dL (ref 0.50–1.10)
GFR calc Af Amer: >90 mL/min (ref >90)
GFR calc non Af Amer: 84 mL/min — ABNORMAL LOW (ref >90)
Glucose, Bld: 144 mg/dL — ABNORMAL HIGH (ref 70–99)
Potassium: 3.4 mEq/L — ABNORMAL LOW (ref 3.5–5.1)

## 2012-12-09 LAB — MAGNESIUM: Magnesium: 1.9 mg/dL (ref 1.5–2.5)

## 2012-12-09 MED ORDER — FAMOTIDINE 20 MG PO TABS: 20.0000 mg | ORAL_TABLET | Freq: Two times a day (BID) | ORAL | Status: DC

## 2012-12-09 MED ORDER — CLARITHROMYCIN 500 MG PO TABS: 500.0000 mg | ORAL_TABLET | Freq: Two times a day (BID) | ORAL | Status: DC

## 2012-12-09 MED ORDER — PREDNISONE 20 MG PO TABS: 20.0000 mg | ORAL_TABLET | Freq: Two times a day (BID) | ORAL | Status: DC

## 2012-12-09 NOTE — Discharge Summary (Signed)
Physician Discharge Summary  Shawna [name] WGN:[id number] DOB: 1946-01-21 DOA: 12/05/2012  PCP: Samuel Jester, DO  Admit date: 12/05/2012 Discharge date: 12/09/2012  Time spent:>45 minutes   Discharge Diagnoses:  Active Problems:   COPD (chronic obstructive pulmonary disease)   COPD exacerbation   Acute-on-chronic respiratory failure   Discharge Condition: stable   Diet recommendation: heart healthy   Filed Weights   12/06/12 0135 12/07/12 0351 12/08/12 0420  Weight: 52.9 kg (116 lb 10 oz) 53.8 kg (118 lb 9.7 oz) 53.5 kg (117 lb 15.1 oz)    History of present illness:  67 y.o. female with PMH of COPD intermittently on home O2, s/p lung resection for what was thought to be lung cancer but ended up being a benign mass. Patient began to feel unwell on 9/17. On 9/18 she was started on clarithromycin due to worsening SOB, and then finally on 9/19 the patient called her Pulmonologist with c/o worsening SOB and was sent to the ED for evaluation. In the ED patient was noted to be in respiratory distress with severe wheezing   Hospital Course:  Acute bronchospastic COPD exacerbation  Was slow to improve - added low-dose scheduled Klonopin as anxiety appears to be contributing - med rec states she takes it at home but she cannot remember her meds- added flutter valve - - she is ambulating and feels near baseline - states she uses O2 at home- 2 L - discharge on slow prednisone taper- cont clarithromycin started as outpt until course complete (10 day course- stop on 27th). Has an appt to see Dr Vassie Loll on 9/9.  - add Pepcid for GI protection- note pt also uses Meloxicam   Hypertension  Well-controlled at this time   Status post resection benign lung mass   Procedures:  none  Consultations:  none  Discharge Exam: Filed Vitals:   12/09/12 1201  BP: 147/81  Pulse: 88  Temp: 98.1 F (36.7 C)  Resp: 15    General: AAO x 3 Cardiovascular: RRR, no murmus Respiratory: mild  ronchi b/l - on 2 L O2- pulse ox 100 %  Discharge Instructions      Discharge Orders   Future Appointments Provider Department Dept Phone   12/25/2012 1:30 PM [facility] [place] [facility] [address] [phone]   Patient to arrive 15 minutes prior to appointment time.   12/25/2012 3:00 PM Oretha Milch, MD [facility] [phone]   Future Orders Complete By Expires   Diet - low sodium heart healthy  As directed    Increase activity slowly  As directed        Medication List         albuterol 108 (90 BASE) MCG/ACT inhaler  Commonly known as:  PROVENTIL HFA;VENTOLIN HFA  Inhale 2 puffs into the lungs every 4 (four) hours as needed. For shortness of breath     albuterol (2.5 MG/3ML) 0.083% nebulizer solution  Commonly known as:  PROVENTIL  Take 2.5 mg by nebulization 4 (four) times daily. For shortness of breath     ALPRAZolam 0.25 MG tablet  Commonly known as:  XANAX  Take 0.25 mg by mouth at bedtime as needed for sleep.     budesonide 0.5 MG/2ML nebulizer solution  Commonly known as:  PULMICORT  Take 2 mLs (0.5 mg total) by nebulization 2 (two) times daily. DX 496     clarithromycin 500 MG tablet  Commonly known as:  BIAXIN  Take 1 tablet (500 mg total) by  mouth 2 (two) times daily.     cyanocobalamin 1000 MCG/ML injection  Commonly known as:  (VITAMIN B-12)  Inject 1,000 mcg into the muscle every 30 (thirty) days.     famotidine 20 MG tablet  Commonly known as:  PEPCID  Take 1 tablet (20 mg total) by mouth 2 (two) times daily. Use until Prednisone complete     Fluticasone-Salmeterol 500-50 MCG/DOSE Aepb  Commonly known as:  ADVAIR  Inhale 1 puff into the lungs 2 (two) times daily.     guaiFENesin 600 MG 12 hr tablet  Commonly known as:  MUCINEX  Take 600 mg by mouth 2 (two) times daily.     hydrochlorothiazide 25 MG tablet  Commonly known as:  HYDRODIURIL  Take 25 mg by mouth daily.     ipratropium 0.02 % nebulizer solution   Commonly known as:  ATROVENT  Take 2.5 mLs (500 mcg total) by nebulization every 4 (four) hours as needed for wheezing.     KLOR-CON M20 20 MEQ tablet  Generic drug:  potassium chloride SA  Take 20 mEq by mouth daily.     loratadine 10 MG tablet  Commonly known as:  CLARITIN  Take 1 tablet (10 mg total) by mouth daily.     meloxicam 15 MG tablet  Commonly known as:  MOBIC  Take 15 mg by mouth daily.     predniSONE 20 MG tablet  Commonly known as:  DELTASONE  - Take 1 tablet (20 mg total) by mouth 2 (two) times daily with a meal. Continue for 3 days  - Then 20 once a day for 3 days  - Then 10 daily for 3 days  - Then 5 daily for 3 days     tiotropium 18 MCG inhalation capsule  Commonly known as:  SPIRIVA HANDIHALER  Place 1 capsule (18 mcg total) into inhaler and inhale daily.     verapamil 180 MG (CO) 24 hr tablet  Commonly known as:  COVERA HS  Take 180 mg by mouth at bedtime.       Allergies  Allergen Reactions  . Percodan [Oxycodone-Aspirin] Other (See Comments)    Reaction unknown      The results of significant diagnostics from this hospitalization (including imaging, microbiology, ancillary and laboratory) are listed below for reference.    Significant Diagnostic Studies: Dg Chest 2 View  12/05/2012   CLINICAL DATA:  Increased shortness of  breath; COPD  EXAM: CHEST  2 VIEW  COMPARISON:  Chest radiograph April 29, 2012 and chest CT Aug 06, 2012  FINDINGS: There is postoperative change on the right. There is scarring with chronic blunting of the right costophrenic angle which is stable. There is underlying emphysema.  There is no edema or consolidation. Heart size is normal. Pulmonary vascularity reflects underlying emphysema. No adenopathy. There is thoracic levoscoliosis. There is postoperative change in the lower cervical spine.  IMPRESSION: Scarring on the right. Underlying emphysema. No edema or consolidation.   Electronically Signed   By: Bretta Bang   On: 12/05/2012 20:27    Microbiology: Recent Results (from the past 240 hour(s))  MRSA PCR SCREENING     Status: None   Collection Time    12/06/12  1:38 AM      Result Value Range Status   MRSA by PCR NEGATIVE  NEGATIVE Final   Comment:            The GeneXpert MRSA Assay (FDA     approved for  NASAL specimens     only), is one component of a     comprehensive MRSA colonization     surveillance program. It is not     intended to diagnose MRSA     infection nor to guide or     monitor treatment for     MRSA infections.     Labs: Basic Metabolic Panel:  Recent Labs Lab 12/06/12 0004 12/06/12 0545 12/06/12 1018 12/09/12 0425  NA 137 135 132* 130*  K 3.8 3.4* 3.6 3.4*  CL 102 95* 92* 90*  CO2  --  28 27 30   GLUCOSE 100* 136* 183* 144*  BUN 11 12 14  27*  CREATININE 1.10 0.72 0.73 0.79  CALCIUM  --  10.0 9.6 9.5  MG  --   --   --  1.9   Liver Function Tests: No results found for this basename: AST, ALT, ALKPHOS, BILITOT, PROT, ALBUMIN,  in the last 168 hours No results found for this basename: LIPASE, AMYLASE,  in the last 168 hours No results found for this basename: AMMONIA,  in the last 168 hours CBC:  Recent Labs Lab 12/05/12 2359 12/06/12 0004 12/06/12 0545  WBC 9.6  --  6.3  HGB 15.0 16.3* 14.3  HCT 43.3 48.0* 41.7  MCV 85.6  --  86.0  PLT 175  --  173   Cardiac Enzymes: No results found for this basename: CKTOTAL, CKMB, CKMBINDEX, TROPONINI,  in the last 168 hours BNP: BNP (last 3 results)  Recent Labs  02/06/12 0615 12/05/12 2359  PROBNP 1979.0* 349.3*   CBG: No results found for this basename: GLUCAP,  in the last 168 hours     Signed:  Fallou Hulbert  [facility] 12/09/2012, 3:07 PM

## 2012-12-09 NOTE — Progress Notes (Signed)
Utilization Review Completed.  

## 2012-12-09 NOTE — Evaluation (Signed)
Occupational Therapy Evaluation Patient Details Name: Shawna [name] MRN: [id number] DOB: 08/30/1945 Today's Date: 12/09/2012 Time: 1478-2956 OT Time Calculation (min): 41 min  OT Assessment / Plan / Recommendation History of present illness Pt presents with acute on chronic COPD exacerbation.   Clinical Impression   Pt is independent in ADL and mobility.  Educated at [employer] in energy conservation and provided handout.  No further OT needs.    OT Assessment  Patient does not need any further OT services    Follow Up Recommendations  No OT follow up;Supervision - Intermittent    Barriers to Discharge      Equipment Recommendations  None recommended by OT    Recommendations for Other Services    Frequency       Precautions / Restrictions Precautions Precautions: None Restrictions Weight Bearing Restrictions: No   Pertinent Vitals/Pain VS monitored throughout, sats remained in 90s with 2L 02    ADL  Eating/Feeding: Independent Where Assessed - Eating/Feeding: Edge of bed Grooming: Wash/dry hands;Independent Where Assessed - Grooming: Unsupported standing Upper Body Bathing: Independent Where Assessed - Upper Body Bathing: Unsupported sitting Lower Body Bathing: Independent Where Assessed - Lower Body Bathing: Unsupported sitting;Supported sit to stand Upper Body Dressing: Independent Where Assessed - Upper Body Dressing: Unsupported sitting Lower Body Dressing: Independent Where Assessed - Lower Body Dressing: Unsupported sitting;Supported sit to stand Toilet Transfer: Independent Toilet Transfer Method: Sit to Barista: Regular height toilet Toileting - Clothing Manipulation and Hygiene: Independent Where Assessed - Toileting Clothing Manipulation and Hygiene: Standing Transfers/Ambulation Related to ADLs: independent, no device ADL Comments: Educated pt at [employer] in energy conservation, handout provided.    OT Diagnosis:    OT Problem  List:   OT Treatment Interventions:     OT Goals(Current goals can be found in the care plan section) Acute Rehab OT Goals Patient Stated Goal: return home quickly  Visit Information  Last OT Received On: 12/09/12 History of Present Illness: Pt presents with acute on chronic COPD exacerbation.       Prior Functioning     Home Living Family/patient expects to be discharged to:: Private residence Living Arrangements: Alone Available Help at Discharge: Family;Available PRN/intermittently Type of Home: House Home Access: Stairs to enter [employer] of Steps: 4 Entrance Stairs-Rails: Right Home Layout: One level Home Equipment:  (02, plastic chair she can use as shower seat) Additional Comments: pt drives, gets own groceries Prior Function Level of Independence: Independent Comments: pt struggled to complete housekeeping, daughter carries in groceries Communication Communication: No difficulties Dominant Hand: Right         Vision/Perception Vision - History Patient Visual Report: No change from baseline   Cognition  Cognition Arousal/Alertness: Awake/alert Behavior During Therapy: WFL for tasks assessed/performed Overall Cognitive Status: Within Functional Limits for tasks assessed    Extremity/Trunk Assessment Upper Extremity Assessment Upper Extremity Assessment: Overall WFL for tasks assessed Lower Extremity Assessment Lower Extremity Assessment: Defer to PT evaluation Cervical / Trunk Assessment Cervical / Trunk Assessment: Normal     Mobility Bed Mobility Bed Mobility: Supine to Sit;Sit to Supine Supine to Sit: 7: Independent Sit to Supine: 7: Independent Transfers Transfers: Sit to Stand;Stand to Sit Sit to Stand: 7: Independent Stand to Sit: 7: Independent     Exercise     Balance     End of Session OT - End of Session Activity Tolerance: Patient limited by fatigue Patient left: in bed;with call bell/phone within reach  GO  Evern Bio 12/09/2012, 9:53 AM [phone]

## 2012-12-25 ENCOUNTER — Encounter: Payer: Self-pay | Admitting: Pulmonary Disease

## 2012-12-25 ENCOUNTER — Ambulatory Visit (INDEPENDENT_AMBULATORY_CARE_PROVIDER_SITE_OTHER): Payer: Medicare Other | Admitting: Pulmonary Disease

## 2012-12-25 ENCOUNTER — Ambulatory Visit (INDEPENDENT_AMBULATORY_CARE_PROVIDER_SITE_OTHER)
Admission: RE | Admit: 2012-12-25 | Discharge: 2012-12-25 | Disposition: A | Discharge status: Home / Self Care | Payer: Medicare Other | Source: Ambulatory Visit | Attending: Pulmonary Disease | Admitting: Pulmonary Disease

## 2012-12-25 VITALS — BP 126/80 | HR 99 | Temp 98.8°F | Ht 66.5 in | Wt 117.8 lb

## 2012-12-25 DIAGNOSIS — R911 Solitary pulmonary nodule: Secondary | ICD-10-CM

## 2012-12-25 DIAGNOSIS — J441 Chronic obstructive pulmonary disease with (acute) exacerbation: Secondary | ICD-10-CM

## 2012-12-25 DIAGNOSIS — J449 Chronic obstructive pulmonary disease, unspecified: Secondary | ICD-10-CM

## 2012-12-25 MED ORDER — PREDNISONE 10 MG PO TABS: ORAL_TABLET | ORAL | Status: DC

## 2012-12-25 NOTE — Patient Instructions (Signed)
Left lower lung nodule has increased in size to 10 mm  Schedule pET scan STOP advair Prednisone 10 mg tabs -Take 4 tabs  daily with food x 4 days, then 3 tabs daily x 4 days, then 2 tabs daily x 4 days, then 1 tab daily x4 days then stop. #40 OK to use albuterol nebs every 6h as needed Flu shot next visit once better

## 2012-12-25 NOTE — Assessment & Plan Note (Signed)
Left lower lung nodule has increased in size to 10 mm  Schedule pET scan

## 2012-12-25 NOTE — Assessment & Plan Note (Signed)
Pulmonary rehab referral to [facility] Feel DLCO more representative of lung function than FEv1 here  Reassess lung function once acute flare resolved for resectability

## 2012-12-25 NOTE — Progress Notes (Signed)
  Subjective:    Patient ID: Shawna [name], female    DOB: 27-Dec-1945, 67 y.o.   MRN: [id number]  HPI  67 year old pt w/ mild obstructive disease by PFTs in march 2013, prior RUL lobectomy in April 2013.  Admitted 02/02/12 for AECOPD and CAP.  She has been on home O2 since 6/13 (by PCP)  Quit smoking 3/13 but relapsed in 2014  ABGs showed compensated hypercarbia.  3/ 2013 PFTs - DLCO 50%, FEV1 1.68 - 64% (pre-op)  12/25/2012 28m FU  Adm 9/19- 12/09/12 for COPD flare Pt here to discuss CT results. Pt reports breathing has not been doing good. She has coughing spells at times but never can get mucus up. C/o frequent chest tx. She has been using the O2 24/7 since her last hospital visit.  CT chest  -Mildly increased size of the chronic left lung nodule, now 8 x 10 mm. This has been slowly increasing since 05/10/2011 Did not get started with pulm rehab since not available at [facility] ON last visit , she felt Advair & spiriva are very expensive >> substituted budesonide/ brovana/ atrovent, but sees like she is back on advair/spiriva C/o hoarseness  Past Medical History  Diagnosis Date  . COPD (chronic obstructive pulmonary disease)   . HTN (hypertension)   . B12 deficiency   . OA (osteoarthritis)   . Hypokalemia   . Mass of lung   . GERD (gastroesophageal reflux disease)   . Diverticulosis of colon (without mention of hemorrhage)   . 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 Pulmonary nodule      Review of Systems neg for any significant sore throat, dysphagia, itching, sneezing, nasal congestion or excess/ purulent secretions, fever, chills, sweats, unintended wt loss, pleuritic or exertional cp, hempoptysis, orthopnea pnd or change in chronic leg swelling. Also denies presyncope, palpitations, heartburn, abdominal pain, nausea, vomiting, diarrhea or change in bowel or urinary habits, dysuria,hematuria, rash, arthralgias, visual complaints, headache, numbness weakness or  ataxia.     Objective:   Physical Exam  Gen. Pleasant, thin, in no distress, normal affect ENT - no lesions, no post nasal drip Neck: No JVD, no thyromegaly, no carotid bruits Lungs: no use of accessory muscles, no dullness to percussion, decreased without rales or rhonchi  Cardiovascular: Rhythm regular, heart sounds  normal, no murmurs or gallops, no peripheral edema Abdomen: soft and non-tender, no hepatosplenomegaly, BS normal. Musculoskeletal: No deformities, no cyanosis or clubbing Neuro:  alert, non focal       Assessment & Plan:

## 2012-12-25 NOTE — Assessment & Plan Note (Signed)
STOP advair Prednisone 10 mg tabs -Take 4 tabs  daily with food x 4 days, then 3 tabs daily x 4 days, then 2 tabs daily x 4 days, then 1 tab daily x4 days then stop. #40 OK to use albuterol nebs every 6h as needed Flu shot next visit once better

## 2012-12-30 ENCOUNTER — Telehealth: Payer: Self-pay | Admitting: Pulmonary Disease

## 2012-12-30 NOTE — Telephone Encounter (Signed)
ATC PT NA line rang several times and NA WCB

## 2012-12-31 NOTE — Telephone Encounter (Signed)
Trial of symbicort 160 2 puffs bid  - to see if hoarseness less with this

## 2012-12-31 NOTE — Telephone Encounter (Signed)
I spoke with pt and is aware of recs. Pt has symbicort at home and did not need sample or rx.

## 2012-12-31 NOTE — Telephone Encounter (Signed)
I called and spoke with pt. She stated she stopped the advair from last OV. Once she did this she noticed increase SOB and chest tx. So on Monday she restarted the advair. Her chest does not feels as bad but is still hoarse. She washes her mouth out after using the advair. She does report when she stopped the advair the hoarseness did get better. Please advise RA if pt needs to be changed to a different inhaler. She has pending appt with [place] 01/15/13. thanks

## 2013-01-08 ENCOUNTER — Encounter (HOSPITAL_COMMUNITY)
Admission: RE | Admit: 2013-01-08 | Discharge: 2013-01-08 | Disposition: A | Discharge status: Home / Self Care | Payer: Medicare Other | Source: Ambulatory Visit | Attending: Pulmonary Disease | Admitting: Pulmonary Disease

## 2013-01-08 ENCOUNTER — Other Ambulatory Visit: Payer: Self-pay | Admitting: Pulmonary Disease

## 2013-01-08 DIAGNOSIS — R911 Solitary pulmonary nodule: Secondary | ICD-10-CM | POA: Insufficient documentation

## 2013-01-08 LAB — GLUCOSE, CAPILLARY: Glucose-Capillary: 99 mg/dL (ref 70–99)

## 2013-01-08 MED ORDER — FLUDEOXYGLUCOSE F - 18 (FDG) INJECTION
18.1000 | Freq: Once | INTRAVENOUS | Status: AC | PRN
  Administered 2013-01-08: 18.1 via INTRAVENOUS

## 2013-01-15 ENCOUNTER — Telehealth: Payer: Self-pay | Admitting: Pulmonary Disease

## 2013-01-15 ENCOUNTER — Encounter: Payer: Self-pay | Admitting: Pulmonary Disease

## 2013-01-15 ENCOUNTER — Ambulatory Visit (INDEPENDENT_AMBULATORY_CARE_PROVIDER_SITE_OTHER): Payer: Medicare Other | Admitting: Pulmonary Disease

## 2013-01-15 VITALS — BP 132/84 | HR 95 | Temp 98.4°F | Ht 66.5 in | Wt 119.0 lb

## 2013-01-15 DIAGNOSIS — N83209 Unspecified ovarian cyst, unspecified side: Secondary | ICD-10-CM

## 2013-01-15 DIAGNOSIS — R911 Solitary pulmonary nodule: Secondary | ICD-10-CM

## 2013-01-15 MED ORDER — FLUCONAZOLE 100 MG PO TABS: 100.0000 mg | ORAL_TABLET | Freq: Every day | ORAL | Status: DC

## 2013-01-15 NOTE — Progress Notes (Signed)
  Subjective:    Patient ID: Shawna [name], female    DOB: 02/28/1946, 67 y.o.   MRN: [id number]  HPI  67 year old ex smoker with COPD -gold B,  RUL lobectomy in April 2013 for benign lesion (hendrickson).  Admitted 02/02/12 for AECOPD and CAP.  She has been on home O2 since 6/13 (by PCP)  Quit smoking 3/13 but relapsed in 2014  ABGs showed compensated hypercarbia.  3/ 2013 PFTs - DLCO 50%, FEV1 1.68 - 64% (pre-op)    Adm 9/19- 12/09/12 for COPD flare  CT chest 12/2012  -Mildly increased size of the chronic left lung nodule, now 8 x 10 mm. This has been slowly increasing since 05/10/2011  Did not get started with pulm rehab since not available at [facility]  ON last visit , she felt Advair & spiriva are very expensive >> substituted budesonide/ brovana/ atrovent, but seems like she is back on advair/spiriva  C/o hoarseness Advair changed to symbicort for hoarseness.  01/15/2013 Accompanied by daughter  Chief Complaint  Patient presents with  . Follow-up    Discuss PET scan results. Pt c/o hoarseness, Still uses the O2 24/7. finished spiriva today and has not noticed any difference   Breathing appears to be worse, feels like chest congestion, but is unable to expectorate. PET scan showed Interval left lower lobe consolidation/collapse. Associated hypermetabolism. The 10 mm left lower lobe nodule noted on prior CTs was not discretely visualized.Associated debris within the left lower lobe bronchus  Two adjacent hypermetabolic foci were present without corresponding CT abnormality. Focal hypermetabolism within the left ovary  Past Medical History  Diagnosis Date  . COPD (chronic obstructive pulmonary disease)   . HTN (hypertension)   . B12 deficiency   . OA (osteoarthritis)   . Hypokalemia   . Mass of lung   . GERD (gastroesophageal reflux disease)   . Diverticulosis of colon (without mention of hemorrhage)   . 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 Pulmonary nodule        Review of Systems neg for any significant sore throat, dysphagia, itching, sneezing, nasal congestion or excess/ purulent secretions, fever, chills, sweats, unintended wt loss, pleuritic or exertional cp, hempoptysis, orthopnea pnd or change in chronic leg swelling. Also denies presyncope, palpitations, heartburn, abdominal pain, nausea, vomiting, diarrhea or change in bowel or urinary habits, dysuria,hematuria, rash, arthralgias, visual complaints, headache, numbness weakness or ataxia.     Objective:   Physical Exam  Gen. Pleasant, well-nourished, in no distress, normal affect ENT - no lesions, no post nasal drip Neck: No JVD, no thyromegaly, no carotid bruits Lungs: no use of accessory muscles, no dullness to percussion, decreased left base without rales or rhonchi  Cardiovascular: Rhythm regular, heart sounds  normal, no murmurs or gallops, no peripheral edema Abdomen: soft and non-tender, no hepatosplenomegaly, BS normal. Musculoskeletal: No deformities, no cyanosis or clubbing Neuro:  alert, non focal       Assessment & Plan:   

## 2013-01-15 NOTE — Telephone Encounter (Signed)
Wait until after bronchoscopy - clarify no acute infection Cal if temperature or green s putum - may need antibiotic

## 2013-01-15 NOTE — Telephone Encounter (Signed)
Pt did not get PNA or flu vaccine at visit today, and she wants to know should she get it? Last PNA documented was 05/06/2002. Please advise. Carron Curie, CMA

## 2013-01-15 NOTE — Patient Instructions (Signed)
Bronchoscopy scheduled for nov 4th Tuesday at 0700 - come to [address] & go to admitting Nothing to eat after midnight the night before STOP advair & symbicort Diflucan 100 mg daily x 7days for thrush Ultrasound of ovary will be scheduled

## 2013-01-15 NOTE — Assessment & Plan Note (Signed)
Ultrasound of ovary will be scheduled

## 2013-01-15 NOTE — Assessment & Plan Note (Addendum)
Interval collapse of left lower lobe and debris within the left lower lobe bronchus is concerning for endobronchial malignancy. Also paramediastinal hypermetabolic noted, no corresponding enlarged lymph nodes are noted on CT Bronchoscopy scheduled for nov 4th Tuesday at 0700  The various options of biopsy including bronchoscopy, CT guided needle aspiration and surgical biopsy were discussed.The risks of each procedure including coughing, bleeding and the  chances of lung puncture requiring chest tube were discussed in great detail. The benefits & alternatives including serial follow up were also discussed.  Nothing to eat after midnight the night before STOP advair & symbicort Diflucan 100 mg daily x 7days for thrush

## 2013-01-16 NOTE — Telephone Encounter (Signed)
LMOMTCB X1 for pt

## 2013-01-16 NOTE — Telephone Encounter (Signed)
Renee advised. Carron Curie, CMA

## 2013-01-19 ENCOUNTER — Encounter (HOSPITAL_COMMUNITY): Payer: Self-pay | Admitting: Pharmacist

## 2013-01-20 ENCOUNTER — Ambulatory Visit (HOSPITAL_COMMUNITY)
Admission: RE | Admit: 2013-01-20 | Discharge: 2013-01-20 | Disposition: A | Discharge status: Home / Self Care | Payer: Medicare Other | Source: Ambulatory Visit | Attending: Pulmonary Disease | Admitting: Pulmonary Disease

## 2013-01-20 ENCOUNTER — Other Ambulatory Visit: Payer: Self-pay | Admitting: Pulmonary Disease

## 2013-01-20 ENCOUNTER — Ambulatory Visit (HOSPITAL_BASED_OUTPATIENT_CLINIC_OR_DEPARTMENT_OTHER)
Admission: RE | Admit: 2013-01-20 | Discharge: 2013-01-20 | Disposition: A | Discharge status: Home / Self Care | Payer: Medicare Other | Source: Ambulatory Visit | Attending: Pulmonary Disease | Admitting: Pulmonary Disease

## 2013-01-20 ENCOUNTER — Encounter (HOSPITAL_COMMUNITY)
Admission: RE | Disposition: A | Discharge status: Home / Self Care | Payer: Medicare Other | Source: Ambulatory Visit | Attending: Pulmonary Disease

## 2013-01-20 ENCOUNTER — Encounter (HOSPITAL_COMMUNITY): Payer: Self-pay | Admitting: Respiratory Therapy

## 2013-01-20 ENCOUNTER — Ambulatory Visit (HOSPITAL_COMMUNITY): Payer: Medicare Other

## 2013-01-20 DIAGNOSIS — R911 Solitary pulmonary nodule: Secondary | ICD-10-CM

## 2013-01-20 DIAGNOSIS — J939 Pneumothorax, unspecified: Secondary | ICD-10-CM

## 2013-01-20 HISTORY — PX: VIDEO BRONCHOSCOPY: SHX5072

## 2013-01-20 SURGERY — BRONCHOSCOPY, WITH FLUOROSCOPY
Anesthesia: Moderate Sedation | Laterality: Bilateral

## 2013-01-20 MED ORDER — LIDOCAINE HCL (PF) 2 % IJ SOLN
INTRAMUSCULAR | Status: DC | PRN
  Administered 2013-01-20: 6 mL

## 2013-01-20 MED ORDER — FENTANYL CITRATE 0.05 MG/ML IJ SOLN
INTRAMUSCULAR | Status: AC
  Filled 2013-01-20: qty 4

## 2013-01-20 MED ORDER — MIDAZOLAM HCL 5 MG/ML IJ SOLN
INTRAMUSCULAR | Status: AC
  Filled 2013-01-20: qty 2

## 2013-01-20 MED ORDER — FENTANYL CITRATE 0.05 MG/ML IJ SOLN
INTRAMUSCULAR | Status: DC | PRN
  Administered 2013-01-20 (×4): 25 ug via INTRAVENOUS

## 2013-01-20 MED ORDER — MIDAZOLAM HCL 10 MG/2ML IJ SOLN
INTRAMUSCULAR | Status: DC | PRN
  Administered 2013-01-20 (×4): 1 mg via INTRAVENOUS

## 2013-01-20 NOTE — Interval H&P Note (Signed)
Shawna [name] has presented today for procedure, with the diagnosis of LLL atelectasis & nodule. The various methods of treatment have been discussed with the patient and family. After consideration of risks, benefits and other options for treatment, the patient has consented to Procedure(s) :  Video bronchoscopy with biopsy .  The patient's history has been reviewed, patient examined, no change in status, stable for surgery. I have reviewed the patient's chart and labs. Questions were answered to the patient's satisfaction    ALVA,RAKESH V.

## 2013-01-20 NOTE — Progress Notes (Signed)
Notified Dr Vassie Loll of chest xray results, will continue to monitor patient for another hour in recovery area. Pt is set up to follow up with Dr Vassie Loll in his office tomorrow.

## 2013-01-20 NOTE — Brief Op Note (Signed)
Indication: LLL atelectasis with PET positive nodules in the 67 -year-old smoker with prior RULobectomy  Written informed consent was obtained from the patient prior to the procedure. The risks of the procedure including coughing, bleeding and a small chance of lung cancer requiring a chest tube were discussed with the patient in great detail and evidenced understanding.  4 mg of Versed and  100 mcg of fentanyl were used in divided doses during the procedure. Bronchoscope was inserted from the mouth. The upper airway appeared normal. Vocal cord showed normal appearance in motion. The trachea bronchial tree was then examined to the subsegmental level. Plenty of thin white mucoid secretions were noted. No endobronchial lesions were noted. The mucosa over RUL stump appeared clear.  Attention was then turned to the left lower lobe. Bronchoalveolar lavage was obtained from the left lower lobe with good return. Transbronchial biopsies x3 were obtained from the medial & posterior subsegments of the left lower lobe. The patient tolerated procedure well with minimal bleeding.  A portable chest XR. will be performed to rule out presence of pneumothorax. She was awake and alert in the end of the procedure.  Cyril Mourning MD. Tonny Bollman. [facility] Pager [phone] If no response call [phone]

## 2013-01-20 NOTE — H&P (View-Only) (Signed)
  Subjective:    Patient ID: Shawna [name], female    DOB: Jun 27, 1945, 67 y.o.   MRN: [id number]  HPI  67 year old ex smoker with COPD -gold B,  RUL lobectomy in April 2013 for benign lesion (hendrickson).  Admitted 02/02/12 for AECOPD and CAP.  She has been on home O2 since 6/13 (by PCP)  Quit smoking 3/13 but relapsed in 2014  ABGs showed compensated hypercarbia.  3/ 2013 PFTs - DLCO 50%, FEV1 1.68 - 64% (pre-op)    Adm 9/19- 12/09/12 for COPD flare  CT chest 12/2012  -Mildly increased size of the chronic left lung nodule, now 8 x 10 mm. This has been slowly increasing since 05/10/2011  Did not get started with pulm rehab since not available at [facility]  ON last visit , she felt Advair & spiriva are very expensive >> substituted budesonide/ brovana/ atrovent, but seems like she is back on advair/spiriva  C/o hoarseness Advair changed to symbicort for hoarseness.  01/15/2013 Accompanied by daughter  Chief Complaint  Patient presents with  . Follow-up    Discuss PET scan results. Pt c/o hoarseness, Still uses the O2 24/7. finished spiriva today and has not noticed any difference   Breathing appears to be worse, feels like chest congestion, but is unable to expectorate. PET scan showed Interval left lower lobe consolidation/collapse. Associated hypermetabolism. The 10 mm left lower lobe nodule noted on prior CTs was not discretely visualized.Associated debris within the left lower lobe bronchus  Two adjacent hypermetabolic foci were present without corresponding CT abnormality. Focal hypermetabolism within the left ovary  Past Medical History  Diagnosis Date  . COPD (chronic obstructive pulmonary disease)   . HTN (hypertension)   . B12 deficiency   . OA (osteoarthritis)   . Hypokalemia   . Mass of lung   . GERD (gastroesophageal reflux disease)   . Diverticulosis of colon (without mention of hemorrhage)   . 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 Pulmonary nodule        Review of Systems neg for any significant sore throat, dysphagia, itching, sneezing, nasal congestion or excess/ purulent secretions, fever, chills, sweats, unintended wt loss, pleuritic or exertional cp, hempoptysis, orthopnea pnd or change in chronic leg swelling. Also denies presyncope, palpitations, heartburn, abdominal pain, nausea, vomiting, diarrhea or change in bowel or urinary habits, dysuria,hematuria, rash, arthralgias, visual complaints, headache, numbness weakness or ataxia.     Objective:   Physical Exam  Gen. Pleasant, well-nourished, in no distress, normal affect ENT - no lesions, no post nasal drip Neck: No JVD, no thyromegaly, no carotid bruits Lungs: no use of accessory muscles, no dullness to percussion, decreased left base without rales or rhonchi  Cardiovascular: Rhythm regular, heart sounds  normal, no murmurs or gallops, no peripheral edema Abdomen: soft and non-tender, no hepatosplenomegaly, BS normal. Musculoskeletal: No deformities, no cyanosis or clubbing Neuro:  alert, non focal       Assessment & Plan:

## 2013-01-20 NOTE — Progress Notes (Signed)
CXR reviewed - small left apical pneumothorax Oxygenating well, no pain D/w daughter Will obtain FU CXR in 24h for resolution - stay on o2 meantime, OK to d once fully recovered from sedation.  Nashaly Dorantes V. [phone]

## 2013-01-20 NOTE — Progress Notes (Addendum)
Video bronchoscopy procedure performed. Bronchial washing intervention performed. Bronchial brushing intervention performed. Biopsy intervention performed.

## 2013-01-21 ENCOUNTER — Ambulatory Visit (INDEPENDENT_AMBULATORY_CARE_PROVIDER_SITE_OTHER)
Admit: 2013-01-21 | Discharge: 2013-01-21 | Disposition: A | Discharge status: Home / Self Care | Payer: Medicare Other | Attending: Pulmonary Disease | Admitting: Pulmonary Disease

## 2013-01-21 ENCOUNTER — Inpatient Hospital Stay (HOSPITAL_COMMUNITY): Payer: Medicare Other

## 2013-01-21 ENCOUNTER — Telehealth: Payer: Self-pay | Admitting: *Deleted

## 2013-01-21 ENCOUNTER — Encounter (HOSPITAL_COMMUNITY): Payer: Self-pay | Admitting: Pulmonary Disease

## 2013-01-21 ENCOUNTER — Ambulatory Visit (HOSPITAL_COMMUNITY): Payer: Medicare Other

## 2013-01-21 ENCOUNTER — Other Ambulatory Visit: Payer: Self-pay | Admitting: Pulmonary Disease

## 2013-01-21 ENCOUNTER — Inpatient Hospital Stay (HOSPITAL_COMMUNITY)
Admission: AD | Admit: 2013-01-21 | Discharge: 2013-01-24 | DRG: 199 | Disposition: A | Discharge status: Home / Self Care | Payer: Medicare Other | Source: Ambulatory Visit | Attending: Pulmonary Disease | Admitting: Pulmonary Disease

## 2013-01-21 ENCOUNTER — Telehealth: Payer: Self-pay | Admitting: Pulmonary Disease

## 2013-01-21 DIAGNOSIS — Z01810 Encounter for preprocedural cardiovascular examination: Secondary | ICD-10-CM

## 2013-01-21 DIAGNOSIS — Z720 Tobacco use: Secondary | ICD-10-CM

## 2013-01-21 DIAGNOSIS — K648 Other hemorrhoids: Secondary | ICD-10-CM

## 2013-01-21 DIAGNOSIS — Z87891 Personal history of nicotine dependence: Secondary | ICD-10-CM

## 2013-01-21 DIAGNOSIS — E871 Hypo-osmolality and hyponatremia: Secondary | ICD-10-CM | POA: Diagnosis present

## 2013-01-21 DIAGNOSIS — E538 Deficiency of other specified B group vitamins: Secondary | ICD-10-CM

## 2013-01-21 DIAGNOSIS — J449 Chronic obstructive pulmonary disease, unspecified: Secondary | ICD-10-CM

## 2013-01-21 DIAGNOSIS — N838 Other noninflammatory disorders of ovary, fallopian tube and broad ligament: Secondary | ICD-10-CM

## 2013-01-21 DIAGNOSIS — I10 Essential (primary) hypertension: Secondary | ICD-10-CM

## 2013-01-21 DIAGNOSIS — Z9981 Dependence on supplemental oxygen: Secondary | ICD-10-CM

## 2013-01-21 DIAGNOSIS — D510 Vitamin B12 deficiency anemia due to intrinsic factor deficiency: Secondary | ICD-10-CM

## 2013-01-21 DIAGNOSIS — T502X5A Adverse effect of carbonic-anhydrase inhibitors, benzothiadiazides and other diuretics, initial encounter: Secondary | ICD-10-CM | POA: Diagnosis present

## 2013-01-21 DIAGNOSIS — K219 Gastro-esophageal reflux disease without esophagitis: Secondary | ICD-10-CM | POA: Diagnosis present

## 2013-01-21 DIAGNOSIS — N83209 Unspecified ovarian cyst, unspecified side: Secondary | ICD-10-CM

## 2013-01-21 DIAGNOSIS — R911 Solitary pulmonary nodule: Secondary | ICD-10-CM | POA: Diagnosis present

## 2013-01-21 DIAGNOSIS — R0902 Hypoxemia: Secondary | ICD-10-CM

## 2013-01-21 DIAGNOSIS — M1990 Unspecified osteoarthritis, unspecified site: Secondary | ICD-10-CM

## 2013-01-21 DIAGNOSIS — J019 Acute sinusitis, unspecified: Secondary | ICD-10-CM

## 2013-01-21 DIAGNOSIS — J189 Pneumonia, unspecified organism: Secondary | ICD-10-CM

## 2013-01-21 DIAGNOSIS — K2940 Chronic atrophic gastritis without bleeding: Secondary | ICD-10-CM

## 2013-01-21 DIAGNOSIS — J9620 Acute and chronic respiratory failure, unspecified whether with hypoxia or hypercapnia: Secondary | ICD-10-CM

## 2013-01-21 DIAGNOSIS — D72829 Elevated white blood cell count, unspecified: Secondary | ICD-10-CM

## 2013-01-21 DIAGNOSIS — J4489 Other specified chronic obstructive pulmonary disease: Secondary | ICD-10-CM

## 2013-01-21 DIAGNOSIS — J95811 Postprocedural pneumothorax: Principal | ICD-10-CM | POA: Diagnosis present

## 2013-01-21 DIAGNOSIS — K5730 Diverticulosis of large intestine without perforation or abscess without bleeding: Secondary | ICD-10-CM

## 2013-01-21 DIAGNOSIS — J441 Chronic obstructive pulmonary disease with (acute) exacerbation: Secondary | ICD-10-CM | POA: Diagnosis present

## 2013-01-21 DIAGNOSIS — N179 Acute kidney failure, unspecified: Secondary | ICD-10-CM

## 2013-01-21 DIAGNOSIS — E876 Hypokalemia: Secondary | ICD-10-CM

## 2013-01-21 DIAGNOSIS — K449 Diaphragmatic hernia without obstruction or gangrene: Secondary | ICD-10-CM

## 2013-01-21 LAB — TROPONIN I: Troponin I: <0.3 ng/mL (ref <0.30)

## 2013-01-21 LAB — CBC WITH DIFFERENTIAL/PLATELET
Basophils Absolute: 0 10*3/uL (ref 0.0–0.1)
Basophils Relative: 0 % (ref 0–1)
Eosinophils Absolute: 0.1 10*3/uL (ref 0.0–0.7)
HCT: 37.6 % (ref 36.0–46.0)
Lymphocytes Relative: 11 % — ABNORMAL LOW (ref 12–46)
MCHC: 35.6 g/dL (ref 30.0–36.0)
Neutro Abs: 6.1 10*3/uL (ref 1.7–7.7)
Neutrophils Relative %: 77 % (ref 43–77)
Platelets: 171 10*3/uL (ref 150–400)
RDW: 14.4 % (ref 11.5–15.5)
WBC: 7.9 10*3/uL (ref 4.0–10.5)

## 2013-01-21 LAB — COMPREHENSIVE METABOLIC PANEL
ALT: 9 U/L (ref 0–35)
AST: 13 U/L (ref 0–37)
BUN: 13 mg/dL (ref 6–23)
CO2: 28 mEq/L (ref 19–32)
Calcium: 9.1 mg/dL (ref 8.4–10.5)
Chloride: 89 mEq/L — ABNORMAL LOW (ref 96–112)
GFR calc Af Amer: 82 mL/min — ABNORMAL LOW (ref >90)
GFR calc non Af Amer: 70 mL/min — ABNORMAL LOW (ref >90)
Glucose, Bld: 135 mg/dL — ABNORMAL HIGH (ref 70–99)
Sodium: 127 mEq/L — ABNORMAL LOW (ref 135–145)
Total Bilirubin: 0.4 mg/dL (ref 0.3–1.2)

## 2013-01-21 LAB — MAGNESIUM: Magnesium: 1.6 mg/dL (ref 1.5–2.5)

## 2013-01-21 LAB — PHOSPHORUS: Phosphorus: 3.4 mg/dL (ref 2.3–4.6)

## 2013-01-21 MED ORDER — POTASSIUM CHLORIDE CRYS ER 20 MEQ PO TBCR
20.0000 meq | EXTENDED_RELEASE_TABLET | Freq: Every day | ORAL | Status: DC
  Administered 2013-01-22 – 2013-01-24 (×3): 20 meq via ORAL
  Filled 2013-01-21 (×3): qty 1

## 2013-01-21 MED ORDER — ENOXAPARIN SODIUM 40 MG/0.4ML ~~LOC~~ SOLN
40.0000 mg | SUBCUTANEOUS | Status: DC
  Administered 2013-01-21 – 2013-01-23 (×3): 40 mg via SUBCUTANEOUS
  Filled 2013-01-21 (×4): qty 0.4

## 2013-01-21 MED ORDER — CYANOCOBALAMIN 1000 MCG/ML IJ SOLN: 1000.0000 ug | INTRAMUSCULAR | Status: DC

## 2013-01-21 MED ORDER — FENTANYL CITRATE 0.05 MG/ML IJ SOLN
25.0000 ug | Freq: Once | INTRAMUSCULAR | Status: AC
  Administered 2013-01-21: 100 ug via INTRAVENOUS
  Filled 2013-01-21: qty 2

## 2013-01-21 MED ORDER — TRAMADOL HCL 50 MG PO TABS
50.0000 mg | ORAL_TABLET | Freq: Four times a day (QID) | ORAL | Status: DC | PRN
  Administered 2013-01-21: 50 mg via ORAL
  Filled 2013-01-21: qty 1

## 2013-01-21 MED ORDER — ALPRAZOLAM 0.25 MG PO TABS
0.2500 mg | ORAL_TABLET | Freq: Every evening | ORAL | Status: DC | PRN
  Administered 2013-01-21 – 2013-01-23 (×2): 0.25 mg via ORAL
  Filled 2013-01-21 (×2): qty 1

## 2013-01-21 MED ORDER — TIOTROPIUM BROMIDE MONOHYDRATE 18 MCG IN CAPS
18.0000 ug | ORAL_CAPSULE | Freq: Every day | RESPIRATORY_TRACT | Status: DC
Start: 2013-01-21 — End: 2013-01-24
  Administered 2013-01-22 – 2013-01-24 (×3): 18 ug via RESPIRATORY_TRACT
  Filled 2013-01-21: qty 5

## 2013-01-21 MED ORDER — GUAIFENESIN ER 600 MG PO TB12
600.0000 mg | ORAL_TABLET | Freq: Two times a day (BID) | ORAL | Status: DC
  Administered 2013-01-21 – 2013-01-24 (×6): 600 mg via ORAL
  Filled 2013-01-21 (×8): qty 1

## 2013-01-21 MED ORDER — VANCOMYCIN HCL IN DEXTROSE 750-5 MG/150ML-% IV SOLN
750.0000 mg | Freq: Once | INTRAVENOUS | Status: AC
  Administered 2013-01-21: 750 mg via INTRAVENOUS
  Filled 2013-01-21: qty 150

## 2013-01-21 MED ORDER — VERAPAMIL HCL ER 180 MG PO TBCR
180.0000 mg | EXTENDED_RELEASE_TABLET | Freq: Every day | ORAL | Status: DC
  Administered 2013-01-21 – 2013-01-23 (×3): 180 mg via ORAL
  Filled 2013-01-21 (×4): qty 1

## 2013-01-21 MED ORDER — ACETAMINOPHEN 325 MG PO TABS: 650.0000 mg | ORAL_TABLET | ORAL | Status: DC | PRN

## 2013-01-21 MED ORDER — MELOXICAM 15 MG PO TABS
15.0000 mg | ORAL_TABLET | Freq: Every day | ORAL | Status: DC
  Administered 2013-01-22 – 2013-01-24 (×3): 15 mg via ORAL
  Filled 2013-01-21 (×3): qty 1

## 2013-01-21 MED ORDER — HYDROCHLOROTHIAZIDE 25 MG PO TABS
25.0000 mg | ORAL_TABLET | Freq: Every day | ORAL | Status: DC
  Administered 2013-01-22: 25 mg via ORAL
  Filled 2013-01-21 (×2): qty 1

## 2013-01-21 MED ORDER — PNEUMOCOCCAL VAC POLYVALENT 25 MCG/0.5ML IJ INJ
0.5000 mL | INJECTION | INTRAMUSCULAR | Status: AC
  Administered 2013-01-22: 0.5 mL via INTRAMUSCULAR
  Filled 2013-01-21: qty 0.5

## 2013-01-21 MED ORDER — SODIUM CHLORIDE 0.9 % IV SOLN: 250.0000 mL | INTRAVENOUS | Status: DC | PRN

## 2013-01-21 MED ORDER — LORATADINE 10 MG PO TABS
10.0000 mg | ORAL_TABLET | Freq: Every day | ORAL | Status: DC
  Administered 2013-01-22 – 2013-01-24 (×3): 10 mg via ORAL
  Filled 2013-01-21 (×3): qty 1

## 2013-01-21 MED ORDER — PIPERACILLIN-TAZOBACTAM 3.375 G IVPB
3.3750 g | Freq: Three times a day (TID) | INTRAVENOUS | Status: DC
  Administered 2013-01-21 – 2013-01-22 (×3): 3.375 g via INTRAVENOUS
  Filled 2013-01-21 (×5): qty 50

## 2013-01-21 MED ORDER — ALBUTEROL SULFATE HFA 108 (90 BASE) MCG/ACT IN AERS
2.0000 | INHALATION_SPRAY | RESPIRATORY_TRACT | Status: DC | PRN
  Filled 2013-01-21: qty 6.7

## 2013-01-21 MED ORDER — INFLUENZA VAC SPLIT QUAD 0.5 ML IM SUSP
0.5000 mL | INTRAMUSCULAR | Status: AC
  Administered 2013-01-22: 0.5 mL via INTRAMUSCULAR
  Filled 2013-01-21: qty 0.5

## 2013-01-21 MED ORDER — VANCOMYCIN HCL 500 MG IV SOLR
500.0000 mg | Freq: Two times a day (BID) | INTRAVENOUS | Status: DC
  Administered 2013-01-22: 500 mg via INTRAVENOUS
  Filled 2013-01-21 (×2): qty 500

## 2013-01-21 MED ORDER — DEXTROSE-NACL 5-0.45 % IV SOLN
INTRAVENOUS | Status: DC
  Administered 2013-01-21 – 2013-01-22 (×2): via INTRAVENOUS

## 2013-01-21 MED ORDER — ALBUTEROL SULFATE (5 MG/ML) 0.5% IN NEBU
INHALATION_SOLUTION | RESPIRATORY_TRACT | Status: AC
  Filled 2013-01-21: qty 0.5

## 2013-01-21 MED ORDER — ALBUTEROL SULFATE (5 MG/ML) 0.5% IN NEBU
2.5000 mg | INHALATION_SOLUTION | Freq: Four times a day (QID) | RESPIRATORY_TRACT | Status: DC
  Administered 2013-01-21 – 2013-01-22 (×5): 2.5 mg via RESPIRATORY_TRACT
  Filled 2013-01-21 (×4): qty 0.5

## 2013-01-21 NOTE — Telephone Encounter (Signed)
Pt is aware. Jennifer Castillo, CMA  

## 2013-01-21 NOTE — Telephone Encounter (Signed)
Received call from Carollee Massed in Radiology with STAT call report on cxr. Pre Carollee Massed, Dr. Harlin Rain reports "pneumothorax is bigger."  Pt has left.   I called Dr. Vassie Loll.  Informed him of this.  He has already viewed cxr and spoken with Mindy about this. Pt will need a regular bed at [facility] with dx of left side pneumothorax.  Dr. Vassie Loll would like a call back once we contact pt to let him know she is aware. [facility] has called for a bed.  They are working on this and will call pt once ready. Mindy is also trying to contact pt.  She will f/u with RA on this.   Will sign off.

## 2013-01-21 NOTE — Progress Notes (Signed)
ANTIBIOTIC CONSULT NOTE - INITIAL  Pharmacy Consult for Zosyn and IV Vancomycin Indication:  R/O LLL PNA  Allergies  Allergen Reactions  . Aspirin Swelling    Lips and face swelled.  . Percodan [Oxycodone-Aspirin] Other (See Comments)    "went out of my head"   Patient Measurements: Height: 5' 6.5" (168.9 cm) Weight: 118 lb 15.4 oz (53.96 kg) IBW/kg (Calculated) : 60.45  Vital Signs: Temp: 97.6 F (36.4 C) (11/05 1335) Temp src: Oral (11/05 1335) BP: 146/95 mmHg (11/05 1335) Pulse Rate: 109 (11/05 1335)  Labs: No results found for this basename: WBC, HGB, PLT, LABCREA, CREATININE,  in the last 72 hours  Microbiology: Recent Results (from the past 720 hour(s))  CULTURE, BAL-QUANTITATIVE     Status: None   Collection Time    01/20/13  9:00 AM      Result Value Range Status   Specimen Description BRONCHIAL ALVEOLAR LAVAGE   Final   Special Requests BRONCHIAL WASHINGS   Final   Gram Stain     Final   Value: FEW WBC PRESENT, PREDOMINANTLY PMN     NO SQUAMOUS EPITHELIAL CELLS SEEN     RARE GRAM POSITIVE COCCI     IN PAIRS RARE GRAM NEGATIVE COCCI     Performed at [facility] Count     Final   Value: >=100,000 COLONIES/ML     Performed at [facility]     Final   Value: Culture reincubated for better growth     Performed at [facility]   Report Status PENDING   Incomplete  FUNGUS CULTURE W SMEAR     Status: None   Collection Time    01/20/13  9:00 AM      Result Value Range Status   Specimen Description BRONCHIAL WASHINGS   Final   Special Requests NONE   Final   Fungal Smear     Final   Value: NO YEAST OR FUNGAL ELEMENTS SEEN     Performed at [facility]   Culture     Final   Value: CULTURE IN PROGRESS FOR FOUR WEEKS     Performed at [facility]   Report Status PENDING   Incomplete   Medical History: Past Medical History  Diagnosis Date  . COPD (chronic obstructive pulmonary disease)   . HTN  (hypertension)   . B12 deficiency   . OA (osteoarthritis)   . Hypokalemia   . Mass of lung   . GERD (gastroesophageal reflux disease)   . Diverticulosis of colon (without mention of hemorrhage)   . 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 Pulmonary nodule     Medications:  Anti-infectives   Start     Dose/Rate Route Frequency Ordered Stop   01/21/13 1445  piperacillin-tazobactam (ZOSYN) IVPB 3.375 g     3.375 g 12.5 mL/hr over 240 Minutes Intravenous 3 times per day 01/21/13 1444       Assessment: 67yo female presents for evaluation of lung nodule by bronchoscopy.  She has a left pneumothorax with possible LLL pneumonia and we have been asked to start her on IV Vancomycin and IV Zosyn for empiric coverage.  She is < IBW by ~ 7KG at 54kg.  She had a normal creatinine in September with an estimated output > 100 ml/min.  There is no indication that her renal function has changed since this time.  Allergies reviewed and she has no known  antibiotic allergies noted.  Goal of Therapy:  Vancomycin trough level 15-20 mcg/ml Therapeutic response to IV antibiotics and clearance of any infection.  Plan:  1.  Will start IV Zosyn 3.375gm every 8 hours to infuse over 4 hours. 2.  Begin IV Vancomycin at 750 mg x1 then 500mg  every 12 hours. 3.  Will check s/s trough if continued > 72 hours and renal function.  Nadara Mustard, PharmD., MS Clinical Pharmacist Pager:  [phone] Thank you for allowing pharmacy to be part of this patients care team. 01/21/2013,2:52 PM

## 2013-01-21 NOTE — Procedures (Signed)
Chest Tube Insertion Procedure Note  Indications:  Clinically significant Pneumothorax  Pre-operative Diagnosis: Pneumothorax  Post-operative Diagnosis: Pneumothorax  Procedure Details  Informed consent was obtained for the procedure, including sedation.  Risks of lung perforation, hemorrhage, arrhythmia, and adverse drug reaction were discussed.   After sterile skin prep, using standard technique, a 14 French tube was placed in the left lateral 6th rib space.  Findings: Gush of air with good air leak on pleurovac  Estimated Blood Loss:  Minimal         Specimens:  None              Complications:  None; patient tolerated the procedure well.         Disposition: med floor         Condition: stable  Attending Attestation: I performed the procedure. CXR pending   Cyril Mourning MD. Tonny Bollman. [facility] Pager [phone] If no response call [phone]

## 2013-01-21 NOTE — H&P (Addendum)
PULMONARY  / CRITICAL CARE MEDICINE  Name: Shawna [name] MRN: [id number] DOB: 03/27/1945    ADMISSION DATE:  01/21/2013   CC -Shortness of breath, chest pain  HISTORY OF PRESENT ILLNESS:  67 year old ex smoker with COPD -gold B, RUL lobectomy in April 2013 for benign lesion (hendrickson).  She has been on home O2 since 6/13 (by PCP)  Quit smoking 3/13 but relapsed in 2014  ABGs showed compensated hypercarbia.  3/ 2013 PFTs - DLCO 50%, FEV1 1.68 - 64% (pre-op)   Adm 9/19- 12/09/12 for COPD flare  CT chest 12/2012 -Mildly increased size of the chronic left lung nodule, now 8 x 10 mm. This has been slowly increasing since 05/10/2011  PET scan showed Interval left lower lobe consolidation/collapse. Associated hypermetabolism. The 10 mm left lower lobe nodule noted on prior CTs was not discretely visualized.Associated debris within the left lower lobe bronchus Two adjacent hypermetabolic foci were present without corresponding CT abnormality. Focal hypermetabolism within the left ovary.  Underwent bronchoscopy 11/4 with transbronchial biopsy complicated by small left apical pneumothorax CXR 11/5 showed increasing size, Pt symptomatic with increasing sob & SSCP. Hence admitted. BAL - atypical cells, BAL cx - pending Daughter also reports increased sleepiness this am   PAST MEDICAL HISTORY :  Past Medical History  Diagnosis Date  . COPD (chronic obstructive pulmonary disease)   . HTN (hypertension)   . B12 deficiency   . OA (osteoarthritis)   . Hypokalemia   . Mass of lung   . GERD (gastroesophageal reflux disease)   . Diverticulosis of colon (without mention of hemorrhage)   . 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 Pulmonary nodule    Past Surgical History  Procedure Laterality Date  . Carpal tunnel release      bilateral  . Tubal ligation      x 2  . Cervical spine surgery    . Lung surgery Right     per patient Dr Dorris Fetch  . Video bronchoscopy Bilateral  01/20/2013    Procedure: VIDEO BRONCHOSCOPY WITH FLUORO;  Surgeon: Oretha Milch, MD;  Location: [facility] ENDOSCOPY;  Service: Cardiopulmonary;  Laterality: Bilateral;   Prior to Admission medications   Medication Sig Start Date End Date Taking? Authorizing Provider  albuterol (PROVENTIL HFA;VENTOLIN HFA) 108 (90 BASE) MCG/ACT inhaler Inhale 2 puffs into the lungs every 4 (four) hours as needed. For shortness of breath 02/08/12   Maretta Bees, MD  albuterol (PROVENTIL) (2.5 MG/3ML) 0.083% nebulizer solution Take 2.5 mg by nebulization 4 (four) times daily. For shortness of breath 02/08/12   Maretta Bees, MD  ALPRAZolam [name]) 0.25 MG tablet Take 0.25 mg by mouth at bedtime as needed for sleep.     Historical Provider, MD  cyanocobalamin (,VITAMIN B-12,) 1000 MCG/ML injection Inject 1,000 mcg into the muscle every 30 (thirty) days.    Historical Provider, MD  fluconazole (DIFLUCAN) 100 MG tablet Take 1 tablet (100 mg total) by mouth daily. 01/15/13   Oretha Milch, MD  guaiFENesin (MUCINEX) 600 MG 12 hr tablet Take 600 mg by mouth 2 (two) times daily.  02/08/12   Shanker Levora Dredge, MD  hydrochlorothiazide (HYDRODIURIL) 25 MG tablet Take 25 mg by mouth daily.    Historical Provider, MD  KLOR-CON M20 20 MEQ tablet Take 20 mEq by mouth daily.  05/11/11   Historical Provider, MD  loratadine (CLARITIN) 10 MG tablet Take 1 tablet (10 mg total) by mouth daily. 02/08/12  Shanker Levora Dredge, MD  meloxicam (MOBIC) 15 MG tablet Take 15 mg by mouth daily.     Historical Provider, MD  tiotropium (SPIRIVA HANDIHALER) 18 MCG inhalation capsule Place 1 capsule (18 mcg total) into inhaler and inhale daily. 02/08/12   Shanker Levora Dredge, MD  verapamil (COVERA HS) 180 MG (CO) 24 hr tablet Take 180 mg by mouth at bedtime.    Historical Provider, MD   Allergies  Allergen Reactions  . Aspirin Swelling    Lips and face swelled.  Warden Fillers [Oxycodone-Aspirin] Other (See Comments)    "went out of my head"     FAMILY HISTORY:  Family History  Problem Relation Age of Onset  . Coronary artery disease Mother 24  . Alzheimer's disease Mother   . Colon cancer     SOCIAL HISTORY:  reports that she quit smoking about 18 months ago. She has never used smokeless tobacco. She reports that she does not drink alcohol or use illicit drugs.  REVIEW OF SYSTEMS:   Constitutional: Negative for fever, chills, weight loss, malaise/fatigue and diaphoresis.  HENT: Negative for hearing loss, ear pain, nosebleeds, congestion, sore throat, neck pain, tinnitus and ear discharge.   Eyes: Negative for blurred vision, double vision, photophobia, pain, discharge and redness.  Respiratory: Negative for cough, hemoptysis, sputum production, wheezing and stridor.   Cardiovascular: Negative for  palpitations, orthopnea, claudication, leg swelling and PND, c/o chest pain Gastrointestinal: Negative for heartburn, nausea, vomiting, abdominal pain, diarrhea, constipation, blood in stool and melena.  Genitourinary: Negative for dysuria, urgency, frequency, hematuria and flank pain.  Musculoskeletal: Negative for myalgias, back pain, joint pain and falls.  Skin: Negative for itching and rash.  Neurological: Negative for dizziness, tingling, tremors, sensory change, speech change, focal weakness, seizures, loss of consciousness, weakness and headaches.  Endo/Heme/Allergies: Negative for environmental allergies and polydipsia. Does not bruise/bleed easily.  SUBJECTIVE:   VITAL SIGNS: Temp:  [97.6 F (36.4 C)] 97.6 F (36.4 C) (11/05 1335) Pulse Rate:  [109] 109 (11/05 1335) Resp:  [17] 17 (11/05 1335) BP: (146)/(95) 146/95 mmHg (11/05 1335) SpO2:  [94 %] 94 % (11/05 1335) Weight:  [53.96 kg (118 lb 15.4 oz)] 53.96 kg (118 lb 15.4 oz) (11/05 1335)  PHYSICAL EXAMINATION: Gen. Pleasant, well-nourished, in no distress, normal affect ENT - no lesions, no post nasal drip Neck: No JVD, no thyromegaly, no carotid  bruits Lungs: no use of accessory muscles, no dullness to percussion, decreased left without rales or rhonchi  Cardiovascular: Rhythm regular, heart sounds  normal, no murmurs, no peripheral edema Abdomen: soft and non-tender, no hepatosplenomegaly, BS normal. Musculoskeletal: No deformities, no cyanosis or clubbing Neuro:  alert, non focal Skin:  Warm, no lesions/ rash   No results found for this basename: NA, K, CL, CO2, BUN, CREATININE, GLUCOSE,  in the last 168 hours No results found for this basename: HGB, HCT, WBC, PLT,  in the last 168 hours Dg Chest 2 View  01/21/2013   CLINICAL DATA:  Pneumothorax post bronchoscopy  EXAM: CHEST  2 VIEW  COMPARISON:  DG CHEST 1V PORT dated 01/20/2013  FINDINGS: The left pneumothorax increase in volume compared to prior. The apical pleura edge measures 27 mm from the chest wall compared to 7 mm on prior. There is now may and lateral and subpulmonic component seen at the left lung base. Pneumothorax occupies approximately 25% of the hemothorax volume. This atelectasis at the right lung base similar prior. No evidence of mediastinal shift.  IMPRESSION: Left pneumothorax  has significantly enlarged in the interval.  Critical Value/emergent results were called by telephone at the time of interpretation on 01/21/2013 at 12:48 PM to [facility] ALVA , who verbally acknowledged these results.   Electronically Signed   By: Genevive Bi M.D.   On: 01/21/2013 12:46   Dg Chest Port 1 View  01/20/2013   CLINICAL DATA:  Status post bronchoscopy with biopsy  EXAM: PORTABLE CHEST - 1 VIEW  COMPARISON:  12/25/2012, 01/08/2013  FINDINGS: The cardiac shadow is stable. The low lungs are well aerated bilaterally. The previously seen left lower lobe consolidation has improved in the interval. A small apical pneumothorax is noted. No other focal abnormality is seen.  IMPRESSION: Small apical pneumothorax.   Electronically Signed   By: Alcide Clever M.D.   On: 01/20/2013 10:26   Dg  C-arm Bronchoscopy  01/20/2013   CLINICAL DATA: bronch   C-ARM BRONCHOSCOPY  Fluoroscopy was utilized by the requesting physician.  No radiographic  interpretation.     ASSESSMENT / PLAN:  Left pneumothorax - Given worsening, proceed with left chest tube placement, risks & benefits discussed CXR after  LLL pneumonia - await BAL cx, start zosyn/ vanc meantime Unclear what PET hypermetabolism represents - no endobronchial lesions,but concern for underlying malignancy remains . H/o RULobectomy for benign lesion noted int he past  COPD exacerbation - ct albuterol nebs + spiriva, No need for steroids  Thrush - resolved, dc diflucan  Lt ovarian mass - PET positive, US pelvis ordered  [facility]  Pulmonary and Critical Care Medicine [facility] Pager: [phone]  01/21/2013, 2:37 PM

## 2013-01-22 ENCOUNTER — Inpatient Hospital Stay (HOSPITAL_COMMUNITY): Payer: Medicare Other

## 2013-01-22 ENCOUNTER — Ambulatory Visit (HOSPITAL_COMMUNITY): Payer: Medicare Other

## 2013-01-22 MED ORDER — DEXTROSE 5 % IV SOLN
1.0000 g | INTRAVENOUS | Status: DC
  Administered 2013-01-22 – 2013-01-23 (×2): 1 g via INTRAVENOUS
  Filled 2013-01-22 (×3): qty 10

## 2013-01-22 MED ORDER — PREDNISONE 20 MG PO TABS
20.0000 mg | ORAL_TABLET | Freq: Every day | ORAL | Status: DC
  Administered 2013-01-22 – 2013-01-24 (×3): 20 mg via ORAL
  Filled 2013-01-22 (×4): qty 1

## 2013-01-22 NOTE — Progress Notes (Signed)
PULMONARY  / CRITICAL CARE MEDICINE  Name: Shawna [name] MRN: [id number] DOB: 04-09-1945    ADMISSION DATE:  01/21/2013   CC -Shortness of breath, chest pain  HISTORY OF PRESENT ILLNESS:  67 year old ex smoker with COPD -gold B, RUL lobectomy in April 2013 for benign lesion (hendrickson).  She has been on home O2 since 6/13 (by PCP)  Quit smoking 3/13 but relapsed in 2014  ABGs showed compensated hypercarbia.  3/ 2013 PFTs - DLCO 50%, FEV1 1.68 - 64% (pre-op)   Adm 9/19- 12/09/12 for COPD flare  CT chest 12/2012 -Mildly increased size of the chronic left lung nodule, now 8 x 10 mm. This has been slowly increasing since 05/10/2011  PET scan showed Interval left lower lobe consolidation/collapse. Associated hypermetabolism. The 10 mm left lower lobe nodule noted on prior CTs was not discretely visualized.Associated debris within the left lower lobe bronchus Two adjacent hypermetabolic foci were present without corresponding CT abnormality. Focal hypermetabolism within the left ovary.  Underwent bronchoscopy 11/4 with transbronchial biopsy complicated by small left apical pneumothorax CXR 11/5 showed increasing size, Pt symptomatic with increasing sob & SSCP. Hence admitted. BAL - atypical cells, BAL cx - pending Daughter also reports increased sleepiness this am  SUBJECTIVE: c/o mild chest pain Breathing better Looks more alert Cough +  VITAL SIGNS: Temp:  [97.5 F (36.4 C)-98.2 F (36.8 C)] 97.9 F (36.6 C) (11/06 0500) Pulse Rate:  [63-109] 63 (11/06 0500) Resp:  [17-20] 18 (11/06 0500) BP: (92-146)/(60-95) 92/60 mmHg (11/06 0500) SpO2:  [91 %-100 %] 92 % (11/06 0816) FiO2 (%):  [0.3 %] 0.3 % (11/05 2005) Weight:  [53.96 kg (118 lb 15.4 oz)-55.5 kg (122 lb 5.7 oz)] 55.5 kg (122 lb 5.7 oz) (11/06 0500)  PHYSICAL EXAMINATION: Gen. Pleasant, well-nourished, in no distress, normal affect ENT - no lesions, no post nasal drip Neck: No JVD, no thyromegaly, no carotid  bruits Lungs: no use of accessory muscles, no dullness to percussion, decreased left without rales or rhonchi  Cardiovascular: Rhythm regular, heart sounds  normal, no murmurs, no peripheral edema, NO air leak on chest tube Abdomen: soft and non-tender, no hepatosplenomegaly, BS normal. Musculoskeletal: No deformities, no cyanosis or clubbing Neuro:  alert, non focal Skin:  Warm, no lesions/ rash    Recent Labs Lab 01/21/13 1620  NA 127*  K 3.8  CL 89*  CO2 28  BUN 13  CREATININE 0.84  GLUCOSE 135*    Recent Labs Lab 01/21/13 1620  HGB 13.4  HCT 37.6  WBC 7.9  PLT 171   Dg Chest 2 View  01/21/2013   CLINICAL DATA:  Pneumothorax post bronchoscopy  EXAM: CHEST  2 VIEW  COMPARISON:  DG CHEST 1V PORT dated 01/20/2013  FINDINGS: The left pneumothorax increase in volume compared to prior. The apical pleura edge measures 27 mm from the chest wall compared to 7 mm on prior. There is now may and lateral and subpulmonic component seen at the left lung base. Pneumothorax occupies approximately 25% of the hemothorax volume. This atelectasis at the right lung base similar prior. No evidence of mediastinal shift.  IMPRESSION: Left pneumothorax has significantly enlarged in the interval.  Critical Value/emergent results were called by telephone at the time of interpretation on 01/21/2013 at 12:48 PM to [facility] Alitzel Cookson , who verbally acknowledged these results.   Electronically Signed   By: Genevive Bi M.D.   On: 01/21/2013 12:46   Dg Chest Port 1 View  01/21/2013  CLINICAL DATA:  Left pneumothorax, chest tube placement  EXAM: PORTABLE CHEST - 1 VIEW  COMPARISON:  Portable exam 1637 hr compared to 01/21/2013  FINDINGS: Pigtail drainage catheter now identified in left hemi thorax extending to the midline.  Stable heart size, mediastinal contours and pulmonary vascularity.  Atherosclerotic calcification of a mildly tortuous thoracic aorta.  Underlying emphysematous changes with scarring at right  base and mild left basilar atelectasis.  Previously identified left pneumothorax has markedly decreased in size with small residual noted at the left apex.  IMPRESSION: Marked decrease in left pneumothorax following pigtail thoracostomy tube placement.  Tips of pigtail thoracostomy tube is at the midline.   Electronically Signed   By: Ulyses Southward M.D.   On: 01/21/2013 16:49   Dg Chest Port 1 View  01/20/2013   CLINICAL DATA:  Status post bronchoscopy with biopsy  EXAM: PORTABLE CHEST - 1 VIEW  COMPARISON:  12/25/2012, 01/08/2013  FINDINGS: The cardiac shadow is stable. The low lungs are well aerated bilaterally. The previously seen left lower lobe consolidation has improved in the interval. A small apical pneumothorax is noted. No other focal abnormality is seen.  IMPRESSION: Small apical pneumothorax.   Electronically Signed   By: Alcide Clever M.D.   On: 01/20/2013 10:26   Dg C-arm Bronchoscopy  01/20/2013   CLINICAL DATA: bronch   C-ARM BRONCHOSCOPY  Fluoroscopy was utilized by the requesting physician.  No radiographic  interpretation.     ASSESSMENT / PLAN:  Left pneumothorax - chest tube to water seal CXR today - can dc in 24h  LLL pneumonia - await BAL cx, start zosyn/ vanc meantime Unclear what PET hypermetabolism represents - no endobronchial lesions,but concern for underlying malignancy remains . H/o RULobectomy for benign lesion noted in the past  COPD exacerbation - ct albuterol nebs + spiriva, Add prednisone 20 mg x 3ds for bspasm  Thrush - resolved, dc diflucan  Lt ovarian mass - PET positive, US pelvis ordered  Hyponatremia  - follow  Hope for dc after tube removal in 24h on oral abx with rpt imaging as outpt  [facility]  Pulmonary and Critical Care Medicine [facility] Pager: [phone]  01/22/2013, 8:34 AM

## 2013-01-22 NOTE — Progress Notes (Signed)
ANTIBIOTIC CONSULT NOTE - Initial Consult  Pharmacy Consult for Rocephin Indication:  R/O LLL PNA  Allergies  Allergen Reactions  . Aspirin Swelling    Lips and face swelled.  . Percodan [Oxycodone-Aspirin] Other (See Comments)    "went out of my head"   Patient Measurements: Height: 5' 6.5" (168.9 cm) Weight: 122 lb 5.7 oz (55.5 kg) IBW/kg (Calculated) : 60.45  Vital Signs: Temp: 97.9 F (36.6 C) (11/06 0500) Temp src: Oral (11/06 0500) BP: 92/60 mmHg (11/06 0500) Pulse Rate: 63 (11/06 0500)  Labs:  Recent Labs  01/21/13 1620  WBC 7.9  HGB 13.4  PLT 171  CREATININE 0.84    Microbiology: Recent Results (from the past 720 hour(s))  AFB CULTURE WITH SMEAR     Status: None   Collection Time    01/20/13  9:00 AM      Result Value Range Status   Specimen Description BRONCHIAL WASHINGS   Final   Special Requests NONE   Final   ACID FAST SMEAR     Final   Value: NO ACID FAST BACILLI SEEN     Performed at [facility]   Culture     Final   Value: CULTURE WILL BE EXAMINED FOR 6 WEEKS BEFORE ISSUING A FINAL REPORT     Performed at [facility]   Report Status PENDING   Incomplete  CULTURE, BAL-QUANTITATIVE     Status: None   Collection Time    01/20/13  9:00 AM      Result Value Range Status   Specimen Description BRONCHIAL ALVEOLAR LAVAGE   Final   Special Requests BRONCHIAL WASHINGS   Final   Gram Stain     Final   Value: FEW WBC PRESENT, PREDOMINANTLY PMN     NO SQUAMOUS EPITHELIAL CELLS SEEN     RARE GRAM POSITIVE COCCI     IN PAIRS RARE GRAM NEGATIVE COCCI     Performed at [facility] Count     Final   Value: >=100,000 COLONIES/ML     Performed at [facility]     Final   Value: MORAXELLA CATARRHALIS(BRANHAMELLA)     Note: BETA LACTAMASE POSITIVE     Performed at [facility]   Report Status PENDING   Incomplete  FUNGUS CULTURE W SMEAR     Status: None   Collection Time    01/20/13  9:00 AM       Result Value Range Status   Specimen Description BRONCHIAL WASHINGS   Final   Special Requests NONE   Final   Fungal Smear     Final   Value: NO YEAST OR FUNGAL ELEMENTS SEEN     Performed at [facility]   Culture     Final   Value: CULTURE IN PROGRESS FOR FOUR WEEKS     Performed at [facility]   Report Status PENDING   Incomplete   Medical History: Past Medical History  Diagnosis Date  . COPD (chronic obstructive pulmonary disease)   . HTN (hypertension)   . B12 deficiency   . OA (osteoarthritis)   . Hypokalemia   . Mass of lung   . GERD (gastroesophageal reflux disease)   . Diverticulosis of colon (without mention of hemorrhage)   . 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 Pulmonary nodule     Medications:  Anti-infectives   Start     Dose/Rate Route Frequency  Ordered Stop   01/22/13 0500  vancomycin (VANCOCIN) 500 mg in sodium chloride 0.9 % 100 mL IVPB     500 mg 100 mL/hr over 60 Minutes Intravenous Every 12 hours 01/21/13 1747     01/21/13 1515  vancomycin (VANCOCIN) IVPB 750 mg/150 ml premix     750 mg 150 mL/hr over 60 Minutes Intravenous  Once 01/21/13 1501 01/21/13 1656   01/21/13 1445  piperacillin-tazobactam (ZOSYN) IVPB 3.375 g     3.375 g 12.5 mL/hr over 240 Minutes Intravenous 3 times per day 01/21/13 1444       Assessment: Shawna [name] presents for evaluation of lung nodule by bronchoscopy.  She has a left pneumothorax with possible LLL pneumonia. BAL has been performed which has grown out moraxella catarrhalis. She has received one day of Vanc/Zosyn. Spoke with Dr. Vassie Loll, and to de-escalate to Rocephin with the plan to discharge on Ceftin PO.  Goal of Therapy:   clearance of infection  Plan:  1. D/c vancomycin and Zosyn 2. Start Rocephin 1g IV q24h 3. When ready for discharge or transitioning to POs, recommend Ceftin 500mg  BID 4. Pharmacy will sign off as Rocephin and Ceftin do not require adjustments for renal function.  Please reconsult if needed.  Vue Pavon D. Ryden Wainer, PharmD Clinical Pharmacist Pager: [phone] 01/22/2013 10:12 AM

## 2013-01-23 ENCOUNTER — Inpatient Hospital Stay (HOSPITAL_COMMUNITY): Payer: Medicare Other

## 2013-01-23 DIAGNOSIS — R911 Solitary pulmonary nodule: Secondary | ICD-10-CM

## 2013-01-23 DIAGNOSIS — E871 Hypo-osmolality and hyponatremia: Secondary | ICD-10-CM

## 2013-01-23 DIAGNOSIS — I10 Essential (primary) hypertension: Secondary | ICD-10-CM

## 2013-01-23 DIAGNOSIS — N83209 Unspecified ovarian cyst, unspecified side: Secondary | ICD-10-CM

## 2013-01-23 DIAGNOSIS — J9620 Acute and chronic respiratory failure, unspecified whether with hypoxia or hypercapnia: Secondary | ICD-10-CM

## 2013-01-23 LAB — URINALYSIS, ROUTINE W REFLEX MICROSCOPIC
Bilirubin Urine: NEGATIVE
Glucose, UA: NEGATIVE mg/dL
Hgb urine dipstick: NEGATIVE
Ketones, ur: NEGATIVE mg/dL
Leukocytes, UA: NEGATIVE
Nitrite: NEGATIVE
Protein, ur: NEGATIVE mg/dL
Specific Gravity, Urine: 1.003 — ABNORMAL LOW (ref 1.005–1.030)
Urobilinogen, UA: 1 mg/dL (ref 0.0–1.0)
pH: 7 (ref 5.0–8.0)

## 2013-01-23 LAB — CORTISOL: Cortisol, Plasma: 34.8 ug/dL

## 2013-01-23 LAB — BASIC METABOLIC PANEL
CO2: 26 mEq/L (ref 19–32)
Chloride: 94 mEq/L — ABNORMAL LOW (ref 96–112)
GFR calc non Af Amer: 87 mL/min — ABNORMAL LOW (ref >90)
Potassium: 3.7 mEq/L (ref 3.5–5.1)
Sodium: 129 mEq/L — ABNORMAL LOW (ref 135–145)

## 2013-01-23 LAB — TSH: TSH: 1.807 u[IU]/mL (ref 0.350–4.500)

## 2013-01-23 LAB — CULTURE, BAL-QUANTITATIVE W GRAM STAIN: Colony Count: >=100000

## 2013-01-23 LAB — CULTURE, BAL-QUANTITATIVE

## 2013-01-23 LAB — OSMOLALITY, URINE: Osmolality, Ur: 171 mosm/kg — ABNORMAL LOW (ref 390–1090)

## 2013-01-23 LAB — SODIUM, URINE, RANDOM: Sodium, Ur: 51 mEq/L

## 2013-01-23 MED ORDER — HYDRALAZINE HCL 20 MG/ML IJ SOLN: 10.0000 mg | Freq: Four times a day (QID) | INTRAMUSCULAR | Status: DC | PRN

## 2013-01-23 MED ORDER — ALBUTEROL SULFATE (5 MG/ML) 0.5% IN NEBU
2.5000 mg | INHALATION_SOLUTION | Freq: Four times a day (QID) | RESPIRATORY_TRACT | Status: DC
  Administered 2013-01-23 – 2013-01-24 (×3): 2.5 mg via RESPIRATORY_TRACT
  Filled 2013-01-23 (×3): qty 0.5

## 2013-01-23 NOTE — Progress Notes (Signed)
Chest Tube Removal   Left [name] catheter removed without difficulty.  Catheter intact upon removal.  Vaseline gauze applied to site and covered with dry gauze.  Repeat CXR pending.  Instructed patient on new symptoms to include chest pain, shortness of breath etc to call RN immediately and obtain CXR sooner than planned 1400 film.  She indicates verbal understanding.     Canary Brim, NP-C [facility] Pgr: [phone] or [phone]  Coralyn Helling, MD [facility] Pulmonary/Critical Care 01/23/2013, 2:07 PM Pager:  [phone] After 3pm call: [phone]

## 2013-01-23 NOTE — Progress Notes (Signed)
PULMONARY  / CRITICAL CARE MEDICINE  Name: KIMIYAH BLICK MRN: [id number] DOB: 1945/09/15    ADMISSION DATE:  01/21/2013  CHIEF COMPLAINT:  Chest pain  BRIEF PATIENT DESCRIPTION:  67 yo female with PET positive Lt lower lung nodule increasing in size s/p bronchoscopy 11/4 and developed Lt PTX after procedure.  Hx of GOLD B COPD, s/p RULectomy April 2013 for benign lesion  SIGNIFICANT EVENTS: 11/04 Bronchoscopy 11/05 Admit for PTX, chest tube inserted  STUDIES:  March 2013 PFT >> FEV1 1.68 (64%), DLCO 50%  LINES / TUBES: 14 Fr Lt chest tube 11/05 >> 11/07  CULTURES: BAL 11/04 >>   ANTIBIOTICS: Rocephin  SUBJECTIVE:  Still has cough with sputum, but less.  Sore in left chest at site.  VITAL SIGNS: Temp:  [97.4 F (36.3 C)-98.1 F (36.7 C)] 97.9 F (36.6 C) (11/07 0515) Pulse Rate:  [72-98] 83 (11/07 0515) Resp:  [18-21] 18 (11/07 0515) BP: (107-131)/(64-89) 131/73 mmHg (11/07 0515) SpO2:  [94 %-98 %] 98 % (11/07 0515) Weight:  [123 lb 3.8 oz (55.9 kg)] 123 lb 3.8 oz (55.9 kg) (11/07 0700)  PHYSICAL EXAMINATION: General:  No distress Neuro:  Normal strength HEENT: No sinus tenderness Cardiovascular:  regular Lungs: decreased breath sounds, no wheeze, Lt chest tube site clean >> no air leak Abdomen:  Soft, non tender Musculoskeletal:   No edema Skin:  No rashes  Labs: CBC Recent Labs     01/21/13  1620  WBC  7.9  HGB  13.4  HCT  37.6  PLT  171   BMET Recent Labs     01/21/13  1620  01/23/13  0534  NA  127*  129*  K  3.8  3.7  CL  89*  94*  CO2  28  26  BUN  13  9  CREATININE  0.84  0.72  GLUCOSE  135*  115*    Electrolytes Recent Labs     01/21/13  1620  01/23/13  0534  CALCIUM  9.1  9.2  MG  1.6   --   PHOS  3.4   --    Liver Enzymes Recent Labs     01/21/13  1620  AST  13  ALT  9  ALKPHOS  84  BILITOT  0.4  ALBUMIN  2.9*    Cardiac Enzymes Recent Labs     01/21/13  1620  TROPONINI  <0.30   Imaging Dg Chest 2  View  01/22/2013   CLINICAL DATA:  recent pneumothorax  EXAM: CHEST  2 VIEW  COMPARISON:  January 21, 2013  FINDINGS: There is a chest tube on the left. Currently there is no appreciable pneumothorax.  There is scarring in the right base with blunting of the right costophrenic angle. There is mild scarring in the left base. There is underlying emphysema. There is no edema or consolidation.  Heart size is normal. Pulmonary vascularity reflects underlying emphysema. There is postoperative change in the right hilum. No adenopathy.  IMPRESSION: There is a scarring bilaterally. No edema or consolidation. No apparent pneumothorax. Chest tube remains in place on the left the.   Electronically Signed   By: Bretta Bang M.D.   On: 01/22/2013 10:05   Dg Chest 2 View  01/21/2013   CLINICAL DATA:  Pneumothorax post bronchoscopy  EXAM: CHEST  2 VIEW  COMPARISON:  DG CHEST 1V PORT dated 01/20/2013  FINDINGS: The left pneumothorax increase in volume compared to prior. The apical pleura edge measures 27  mm from the chest wall compared to 7 mm on prior. There is now may and lateral and subpulmonic component seen at the left lung base. Pneumothorax occupies approximately 25% of the hemothorax volume. This atelectasis at the right lung base similar prior. No evidence of mediastinal shift.  IMPRESSION: Left pneumothorax has significantly enlarged in the interval.  Critical Value/emergent results were called by telephone at the time of interpretation on 01/21/2013 at 12:48 PM to [facility] , who verbally acknowledged these results.   Electronically Signed   By: Genevive Bi M.D.   On: 01/21/2013 12:46   Dg Chest Port 1 View  01/23/2013   CLINICAL DATA:  Pneumothorax  EXAM: PORTABLE CHEST - 1 VIEW  COMPARISON:  01/22/2013  FINDINGS: A large bore chest tube is noted on the left. No pneumothorax is identified. Some left retrocardiac atelectasis is seen increased from the prior exam. Chronic changes are noted in the right  lung base. Postoperative changes are again seen on the right. The osseous structures are within normal limits.  IMPRESSION: Increased left retrocardiac density likely related to atelectasis. No recurrent pneumothorax is seen.   Electronically Signed   By: Alcide Clever M.D.   On: 01/23/2013 07:38   Dg Chest Port 1 View  01/21/2013   CLINICAL DATA:  Left pneumothorax, chest tube placement  EXAM: PORTABLE CHEST - 1 VIEW  COMPARISON:  Portable exam 1637 hr compared to 01/21/2013  FINDINGS: Pigtail drainage catheter now identified in left hemi thorax extending to the midline.  Stable heart size, mediastinal contours and pulmonary vascularity.  Atherosclerotic calcification of a mildly tortuous thoracic aorta.  Underlying emphysematous changes with scarring at right base and mild left basilar atelectasis.  Previously identified left pneumothorax has markedly decreased in size with small residual noted at the left apex.  IMPRESSION: Marked decrease in left pneumothorax following pigtail thoracostomy tube placement.  Tips of pigtail thoracostomy tube is at the midline.   Electronically Signed   By: Ulyses Southward M.D.   On: 01/21/2013 16:49       ASSESSMENT / PLAN:  A: Lt PTX after bronchoscopy >> resolved on CXR 11/07. P: -d/c chest tube 11/07 -f/u CXR  A: Lt lower lung nodule >> atypical cells on BA, brushing >> surgical path negative for malignancy. P: -f/u as outpt  A: LLL PNA. AECOPD. P: -continue spiriva, albuterol -wean off prednisone as tolerated -Day 3 of Abx >> current on rocephin >> if stable, then change to po Abx 11/08  A: Hyponatremia. P: -f/u BMET -hold HCTZ -check TSH, cortisol -d/c D5 1/2 NS IV fluid -check U/A, urine Na, urine osmo  A: Hx of HTN P: -prn hydralazine for SBP > 170 -continue calan  A: PET positive Lt ovarian mass. P: -f/u ultrasound >> may need Gyn evaluation as outpt  Coralyn Helling, MD [facility] Pulmonary/Critical Care 01/23/2013, 8:58 AM Pager:   [phone] After 3pm call: [phone]

## 2013-01-24 ENCOUNTER — Inpatient Hospital Stay (HOSPITAL_COMMUNITY): Payer: Medicare Other

## 2013-01-24 DIAGNOSIS — J449 Chronic obstructive pulmonary disease, unspecified: Secondary | ICD-10-CM

## 2013-01-24 DIAGNOSIS — J189 Pneumonia, unspecified organism: Secondary | ICD-10-CM

## 2013-01-24 DIAGNOSIS — N838 Other noninflammatory disorders of ovary, fallopian tube and broad ligament: Secondary | ICD-10-CM

## 2013-01-24 LAB — BASIC METABOLIC PANEL
Calcium: 9.5 mg/dL (ref 8.4–10.5)
Chloride: 91 mEq/L — ABNORMAL LOW (ref 96–112)
Creatinine, Ser: 0.75 mg/dL (ref 0.50–1.10)
GFR calc Af Amer: >90 mL/min (ref >90)
GFR calc non Af Amer: 86 mL/min — ABNORMAL LOW (ref >90)

## 2013-01-24 LAB — GLUCOSE, CAPILLARY: Glucose-Capillary: 89 mg/dL (ref 70–99)

## 2013-01-24 MED ORDER — PREDNISONE 10 MG PO TABS: ORAL_TABLET | ORAL | Status: DC

## 2013-01-24 MED ORDER — AMOXICILLIN-POT CLAVULANATE 875-125 MG PO TABS: 1.0000 | ORAL_TABLET | Freq: Two times a day (BID) | ORAL | Status: DC

## 2013-01-24 NOTE — Progress Notes (Signed)
PULMONARY  / CRITICAL CARE MEDICINE  Name: Shawna [name] MRN: [id number] DOB: January 19, 1946    ADMISSION DATE:  01/21/2013  CHIEF COMPLAINT:  Chest pain  BRIEF PATIENT DESCRIPTION:  67 yo female with PET positive Lt lower lung nodule increasing in size s/p bronchoscopy 11/4 and developed Lt PTX after procedure.  Hx of GOLD B COPD, s/p RULectomy April 2013 for benign lesion  SIGNIFICANT EVENTS: 11/04 Bronchoscopy 11/05 Admit for PTX, chest tube inserted  STUDIES:  March 2013 PFT >> FEV1 1.68 (64%), DLCO 50%  LINES / TUBES: 14 Fr Lt chest tube 11/05 >> 11/07  CULTURES: BAL 11/04 >> M.Cat.   ANTIBIOTICS: Rocephin  SUBJECTIVE:  Much improved, wants to go home.  Has been walking around room with no increased wob.   VITAL SIGNS: Temp:  [97.5 F (36.4 C)-98.2 F (36.8 C)] 97.6 F (36.4 C) (11/08 0653) Pulse Rate:  [69-82] 69 (11/08 0653) Resp:  [18-20] 18 (11/08 0653) BP: (121-136)/(70-79) 131/76 mmHg (11/08 0653) SpO2:  [93 %-99 %] 98 % (11/08 0856) FiO2 (%):  [28 %] 28 % (11/07 2029)  PHYSICAL EXAMINATION: General:  Wd female nad Neuro:  Alert, moves all 4.  HEENT: no nasal purulence, no LN or TMG Cardiovascular:  regular Lungs: decreased breath sounds, no wheeze, mild left basilar crackles Abdomen:  Soft, non tender Musculoskeletal:   No edema Skin:  No rashes  Labs: CBC Recent Labs     01/21/13  1620  WBC  7.9  HGB  13.4  HCT  37.6  PLT  171   BMET Recent Labs     01/21/13  1620  01/23/13  0534  01/24/13  0545  NA  127*  129*  130*  K  3.8  3.7  3.7  CL  89*  94*  91*  CO2  28  26  28   BUN  13  9  9   CREATININE  0.84  0.72  0.75  GLUCOSE  135*  115*  77    Electrolytes Recent Labs     01/21/13  1620  01/23/13  0534  01/24/13  0545  CALCIUM  9.1  9.2  9.5  MG  1.6   --    --   PHOS  3.4   --    --    Liver Enzymes Recent Labs     01/21/13  1620  AST  13  ALT  9  ALKPHOS  84  BILITOT  0.4  ALBUMIN  2.9*    Cardiac  Enzymes Recent Labs     01/21/13  1620  TROPONINI  <0.30   Imaging Dg Chest 2 View  01/22/2013   CLINICAL DATA:  recent pneumothorax  EXAM: CHEST  2 VIEW  COMPARISON:  January 21, 2013  FINDINGS: There is a chest tube on the left. Currently there is no appreciable pneumothorax.  There is scarring in the right base with blunting of the right costophrenic angle. There is mild scarring in the left base. There is underlying emphysema. There is no edema or consolidation.  Heart size is normal. Pulmonary vascularity reflects underlying emphysema. There is postoperative change in the right hilum. No adenopathy.  IMPRESSION: There is a scarring bilaterally. No edema or consolidation. No apparent pneumothorax. Chest tube remains in place on the left the.   Electronically Signed   By: Bretta Bang M.D.   On: 01/22/2013 10:05   US Transvaginal Non-ob  01/23/2013   CLINICAL DATA:  Abnormal PET scan and  the left side of the pelvis.  EXAM: TRANSABDOMINAL AND TRANSVAGINAL ULTRASOUND OF PELVIS  TECHNIQUE: Both transabdominal and transvaginal ultrasound examinations of the pelvis were performed. Transabdominal technique was performed for global imaging of the pelvis including uterus, ovaries, adnexal regions, and pelvic cul-de-sac. It was necessary to proceed with endovaginal exam following the transabdominal exam to visualize the ovaries and adnexa.  COMPARISON:  PET scan dated 01/08/2013  FINDINGS: Uterus  Measurements: 4.0 x 2.5 x 4.6 cm. No fibroids or other mass visualized. The uterus is retroverted.  Endometrium  Thickness: 5.1 mm.  No focal abnormality visualized.  Right ovary  Not visualized.  Left ovary  Not visualized.  Other findings  No free fluid.  IMPRESSION: No visible adnexal mass. The ovaries could not be identified. The area of abnormal activity in the left side of the pelvis on PET-CT scan is far lateral along the left pelvic sidewall.   Electronically Signed   By: Geanie Cooley M.D.   On:  01/23/2013 15:00   US Pelvis Complete  01/23/2013   CLINICAL DATA:  Abnormal PET scan and the left side of the pelvis.  EXAM: TRANSABDOMINAL AND TRANSVAGINAL ULTRASOUND OF PELVIS  TECHNIQUE: Both transabdominal and transvaginal ultrasound examinations of the pelvis were performed. Transabdominal technique was performed for global imaging of the pelvis including uterus, ovaries, adnexal regions, and pelvic cul-de-sac. It was necessary to proceed with endovaginal exam following the transabdominal exam to visualize the ovaries and adnexa.  COMPARISON:  PET scan dated 01/08/2013  FINDINGS: Uterus  Measurements: 4.0 x 2.5 x 4.6 cm. No fibroids or other mass visualized. The uterus is retroverted.  Endometrium  Thickness: 5.1 mm.  No focal abnormality visualized.  Right ovary  Not visualized.  Left ovary  Not visualized.  Other findings  No free fluid.  IMPRESSION: No visible adnexal mass. The ovaries could not be identified. The area of abnormal activity in the left side of the pelvis on PET-CT scan is far lateral along the left pelvic sidewall.   Electronically Signed   By: Geanie Cooley M.D.   On: 01/23/2013 15:00   Dg Chest Port 1 View  01/24/2013   CLINICAL DATA:  Follow-up pneumothorax  EXAM: PORTABLE CHEST - 1 VIEW  COMPARISON:  01/23/2013.  , 01/21/2013  FINDINGS: The heart size and mediastinal contours are within normal limits. There is persistent left lower lobe retrocardiac airspace disease which may reflect atelectasis. . There are postsurgical changes in the right hilum with surgical clips present and tenting of the right diaphragm consistent with prior lobectomy. The visualized skeletal structures are unremarkable.  IMPRESSION: No pneumothorax.  Stable, persistent left lower lobe retrocardiac airspace disease likely reflecting atelectasis versus developing pneumonia.   Electronically Signed   By: Elige Ko   On: 01/24/2013 09:28   Dg Chest Port 1 View  01/23/2013   CLINICAL DATA:  Left chest tube  removal, evaluate for pneumothorax  EXAM: PORTABLE CHEST - 1 VIEW  COMPARISON:  01/23/2013, 01/22/2013  FINDINGS: Left chest tube has been removed. No significant or recurrent pneumothorax. Chronic volume loss and scarring in the right hemithorax from prior surgery. Left lower lobe retrocardiac consolidation persist obscuring the hemidiaphragm. Atherosclerosis of the aorta. No significant change.  IMPRESSION: Left chest tube removal.  No significant or recurrent pneumothorax  Stable postoperative findings   Electronically Signed   By: Ruel Favors M.D.   On: 01/23/2013 14:22   Dg Chest Port 1 View  01/23/2013   CLINICAL DATA:  Pneumothorax  EXAM: PORTABLE CHEST - 1 VIEW  COMPARISON:  01/22/2013  FINDINGS: A large bore chest tube is noted on the left. No pneumothorax is identified. Some left retrocardiac atelectasis is seen increased from the prior exam. Chronic changes are noted in the right lung base. Postoperative changes are again seen on the right. The osseous structures are within normal limits.  IMPRESSION: Increased left retrocardiac density likely related to atelectasis. No recurrent pneumothorax is seen.   Electronically Signed   By: Alcide Clever M.D.   On: 01/23/2013 07:38       ASSESSMENT / PLAN:  A: Lt PTX after bronchoscopy >> resolved on CXR 11/07. cxr this am shows no ptx, but persistent infiltrate vs atx left base P: - ok for d/c home.  Has appt with Vassie Loll tues next week and can check cxr again.  A: Lt lower lung nodule >> atypical cells on BA, brushing >> surgical path negative for malignancy. P: -f/u as outpt  A: LLL PNA.-culture + for moraxella. AECOPD. P: -d/c home on usual pulmonary meds -taper off prednisone over 8 days -d/c home on levaquin 750mg  for 5 days more  A: Hyponatremia.- urine lytes, cortisol, tsh ok.  Suspect HCTZ culprit P: -will send home OFF HCTZ and potassium supplement -keep on other BP meds -needs to see primary next 1-2 weeks to look at BP  meds -can get BMET in office Tues during alva f/u   A: PET positive Lt ovarian mass. P: -U/S negative.  Will need further evaluation per OB?  Will send home today.  She has oxygen at home and wears 2 liters.  Has apptm with Dr. Vassie Loll in 3 days.

## 2013-01-24 NOTE — Discharge Summary (Signed)
Physician Discharge Summary     Patient ID: Shawna [name] MRN: [id number] DOB/AGE: 12-11-1945 67 y.o.  Admit date: 01/21/2013 Discharge date: 01/24/2013  Discharge Diagnoses:  Active Problems:   COPD (chronic obstructive pulmonary disease)   Pulmonary nodule   COPD exacerbation   Hyponatremia   Pneumothorax of left lung after biopsy   Pneumonia   Ovarian mass: PET SCAN POSITIVE    Detailed Hospital Course:    67 yo female with PET positive Lt lower lung nodule increasing in size s/p bronchoscopy 11/4 and developed Lt PTX after procedure. Hx of GOLD B COPD, s/p RULectomy April 2013 for benign lesion.   Was admitted initially to SDU. Therapeutic interventions included: Chest tube placement on 11/5 and serial CXRs. PTX resolved. Pt transitioned to water seal, then CT was removed. F/U CXR on day of d/c 11/8 with out ptx.   In addition to PTX treatment: the pathology results from the bronchoscopy were reported as negative for malignancy but did show atypical cells. 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 And she was started on rocephin. In addition to rocephin she was also treated w/ pred taper for mild COPD flare. She has improved with pred, BDs and abx.   Only additional concern of note is of on-going hyponatremia. Her urine lytes, cortisol, and Tsh were evaluated. We felt that this was likely due to her HCTZ which we have held at time of d/c. Her plan for d/c is as outlined below.     Discharge Plan by diagnoses   Lt PTX after bronchoscopy >> resolved on CXR 11/07.  cxr this am shows no ptx, but persistent infiltrate vs atx left base  P:  - ok for d/c home. Has appt with Vassie Loll tues next week and can check cxr again.   Lt lower lung nodule >> atypical cells on BA, brushing >> surgical path negative for malignancy.  P:  -f/u as outpt   LLL PNA.-culture + for moraxella.  AECOPD.  P:  -d/c home on usual pulmonary meds  -taper off prednisone over 8 days  -d/c home on levaquin  750mg  for 5 days more   Hyponatremia.- urine lytes, cortisol, tsh ok. Suspect HCTZ culprit  P:  -will send home OFF HCTZ and potassium supplement  -keep on other BP meds  -needs to see primary next 1-2 weeks to look at BP meds  -can get BMET in office Tues during alva f/u   PET positive Lt ovarian mass.  P:  -U/S negative. Will need further evaluation per GYN?  Significant Hospital tests/ studies/ interventions and procedures  Consults SIGNIFICANT EVENTS:  11/04 Bronchoscopy  11/05 Admit for PTX, chest tube inserted  STUDIES:  March 2013 PFT >> FEV1 1.68 (64%), DLCO 50%  LINES / TUBES:  14 Fr Lt chest tube 11/05 >> 11/07  CULTURES:  BAL 11/04 >> M.Cat. Beta lactamase positive  ANTIBIOTICS:  Rocephin 11/6>>> augmentin 11/8>>>  Discharge Exam: BP 131/76  Pulse 69  Temp(Src) 97.6 F (36.4 C) (Oral)  Resp 18  Ht 5' 6.5" (1.689 m)  Wt 55.9 kg (123 lb 3.8 oz)  BMI 19.60 kg/m2  SpO2 98% 2liters  General: Wd female nad  Neuro: Alert, moves all 4.  HEENT: no nasal purulence, no LN or TMG  Cardiovascular: regular  Lungs: decreased breath sounds, no wheeze, mild left basilar crackles  Abdomen: Soft, non tender  Musculoskeletal: No edema  Skin: No rashes    Labs at discharge Lab Results  Component Value  Date   CREATININE 0.75 01/24/2013   BUN 9 01/24/2013   NA 130* 01/24/2013   K 3.7 01/24/2013   CL 91* 01/24/2013   CO2 28 01/24/2013   Lab Results  Component Value Date   WBC 7.9 01/21/2013   HGB 13.4 01/21/2013   HCT 37.6 01/21/2013   MCV 85.5 01/21/2013   PLT 171 01/21/2013   Lab Results  Component Value Date   ALT 9 01/21/2013   AST 13 01/21/2013   ALKPHOS 84 01/21/2013   BILITOT 0.4 01/21/2013   Lab Results  Component Value Date   INR 1.05 07/04/2011   INR 1.0 10/01/2006    Current radiology studies Dg Chest Port 1 View  01/24/2013   CLINICAL DATA:  Follow-up pneumothorax  EXAM: PORTABLE CHEST - 1 VIEW  COMPARISON:  01/23/2013.  , 01/21/2013  FINDINGS: The  heart size and mediastinal contours are within normal limits. There is persistent left lower lobe retrocardiac airspace disease which may reflect atelectasis. . There are postsurgical changes in the right hilum with surgical clips present and tenting of the right diaphragm consistent with prior lobectomy. The visualized skeletal structures are unremarkable.  IMPRESSION: No pneumothorax.  Stable, persistent left lower lobe retrocardiac airspace disease likely reflecting atelectasis versus developing pneumonia.   Electronically Signed   By: Elige Ko   On: 01/24/2013 09:28   Dg Chest Port 1 View  01/23/2013   CLINICAL DATA:  Left chest tube removal, evaluate for pneumothorax  EXAM: PORTABLE CHEST - 1 VIEW  COMPARISON:  01/23/2013, 01/22/2013  FINDINGS: Left chest tube has been removed. No significant or recurrent pneumothorax. Chronic volume loss and scarring in the right hemithorax from prior surgery. Left lower lobe retrocardiac consolidation persist obscuring the hemidiaphragm. Atherosclerosis of the aorta. No significant change.  IMPRESSION: Left chest tube removal.  No significant or recurrent pneumothorax  Stable postoperative findings   Electronically Signed   By: Ruel Favors M.D.   On: 01/23/2013 14:22   Dg Chest Port 1 View  01/23/2013   CLINICAL DATA:  Pneumothorax  EXAM: PORTABLE CHEST - 1 VIEW  COMPARISON:  01/22/2013  FINDINGS: A large bore chest tube is noted on the left. No pneumothorax is identified. Some left retrocardiac atelectasis is seen increased from the prior exam. Chronic changes are noted in the right lung base. Postoperative changes are again seen on the right. The osseous structures are within normal limits.  IMPRESSION: Increased left retrocardiac density likely related to atelectasis. No recurrent pneumothorax is seen.   Electronically Signed   By: Alcide Clever M.D.   On: 01/23/2013 07:38    Disposition:  01-Home or Self Care      Discharge Orders   Future Appointments  Provider Department Dept Phone   01/27/2013 4:00 PM Oretha Milch, MD [place] [facility] [phone]   Future Orders Complete By Expires   Diet - low sodium heart healthy  As directed    For home use only DME oxygen  As directed    Questions:     Mode or (Route):  Nasal cannula   Liters per Minute:  2   Frequency:     Oxygen conserving device:     Increase activity slowly  As directed        Medication List    STOP taking these medications       fluconazole 100 MG tablet  Commonly known as:  DIFLUCAN     hydrochlorothiazide 25 MG tablet  Commonly  known as:  HYDRODIURIL      TAKE these medications       acetaminophen 500 MG tablet  Commonly known as:  TYLENOL  Take 500 mg by mouth every 6 (six) hours as needed for moderate pain.     albuterol 108 (90 BASE) MCG/ACT inhaler  Commonly known as:  PROVENTIL HFA;VENTOLIN HFA  Inhale 2 puffs into the lungs every 4 (four) hours as needed. For shortness of breath     albuterol (2.5 MG/3ML) 0.083% nebulizer solution  Commonly known as:  PROVENTIL  Take 2.5 mg by nebulization 4 (four) times daily. For shortness of breath     ALPRAZolam 0.25 MG tablet  Commonly known as:  XANAX  Take 0.25 mg by mouth at bedtime as needed for sleep.     amoxicillin-clavulanate 875-125 MG per tablet  Commonly known as:  AUGMENTIN  Take 1 tablet by mouth 2 (two) times daily.     cyanocobalamin 1000 MCG/ML injection  Commonly known as:  (VITAMIN B-12)  Inject 1,000 mcg into the muscle every 30 (thirty) days.     guaiFENesin 600 MG 12 hr tablet  Commonly known as:  MUCINEX  Take 600 mg by mouth 2 (two) times daily.     KLOR-CON M20 20 MEQ tablet  Generic drug:  potassium chloride SA  Take 20 mEq by mouth daily.     loratadine 10 MG tablet  Commonly known as:  CLARITIN  Take 1 tablet (10 mg total) by mouth daily.     meloxicam 15 MG tablet  Commonly known as:  MOBIC  Take 15 mg by mouth daily.     predniSONE 10 MG tablet    Commonly known as:  DELTASONE  Take 4 tabs qd x 2d, then 3 tab qd x 2d, then 2 tab qd x 2d, then 1 tab po qd x 2d     tiotropium 18 MCG inhalation capsule  Commonly known as:  SPIRIVA HANDIHALER  Place 1 capsule (18 mcg total) into inhaler and inhale daily.     verapamil 180 MG (CO) 24 hr tablet  Commonly known as:  COVERA HS  Take 180 mg by mouth at bedtime.       Follow-up Information   Follow up with BUTLER, CYNTHIA, DO In 1 week.   Contact information:   [address] [place] [address] [phone]       Discharged Condition: good  Physician Statement:   The Patient was personally examined, the discharge assessment and plan has been personally reviewed and I agree with ACNP Sybilla Malhotra's assessment and plan. > 30 minutes of time have been dedicated to discharge assessment, planning and discharge instructions.   Signed: Shelia Kingsberry,PETE 01/24/2013, 1:15 PM

## 2013-01-25 NOTE — Discharge Summary (Signed)
Pt seen on d/c day and evaluated.  Agree with historical info and management plan as documented.

## 2013-01-26 ENCOUNTER — Encounter (HOSPITAL_COMMUNITY): Payer: Self-pay | Admitting: Emergency Medicine

## 2013-01-26 ENCOUNTER — Inpatient Hospital Stay (HOSPITAL_COMMUNITY)
Admission: EM | Admit: 2013-01-26 | Discharge: 2013-01-30 | DRG: 199 | Disposition: A | Discharge status: Home / Self Care | Payer: Medicare Other | Attending: Pulmonary Disease | Admitting: Pulmonary Disease

## 2013-01-26 ENCOUNTER — Telehealth: Payer: Self-pay | Admitting: Critical Care Medicine

## 2013-01-26 ENCOUNTER — Emergency Department (HOSPITAL_COMMUNITY): Payer: Medicare Other

## 2013-01-26 DIAGNOSIS — J189 Pneumonia, unspecified organism: Secondary | ICD-10-CM | POA: Diagnosis present

## 2013-01-26 DIAGNOSIS — D72829 Elevated white blood cell count, unspecified: Secondary | ICD-10-CM

## 2013-01-26 DIAGNOSIS — Z9981 Dependence on supplemental oxygen: Secondary | ICD-10-CM

## 2013-01-26 DIAGNOSIS — K5730 Diverticulosis of large intestine without perforation or abscess without bleeding: Secondary | ICD-10-CM

## 2013-01-26 DIAGNOSIS — J939 Pneumothorax, unspecified: Secondary | ICD-10-CM

## 2013-01-26 DIAGNOSIS — J9620 Acute and chronic respiratory failure, unspecified whether with hypoxia or hypercapnia: Secondary | ICD-10-CM | POA: Diagnosis present

## 2013-01-26 DIAGNOSIS — J441 Chronic obstructive pulmonary disease with (acute) exacerbation: Secondary | ICD-10-CM | POA: Diagnosis present

## 2013-01-26 DIAGNOSIS — R911 Solitary pulmonary nodule: Secondary | ICD-10-CM

## 2013-01-26 DIAGNOSIS — D510 Vitamin B12 deficiency anemia due to intrinsic factor deficiency: Secondary | ICD-10-CM

## 2013-01-26 DIAGNOSIS — K2940 Chronic atrophic gastritis without bleeding: Secondary | ICD-10-CM

## 2013-01-26 DIAGNOSIS — J9383 Other pneumothorax: Secondary | ICD-10-CM

## 2013-01-26 DIAGNOSIS — Z79899 Other long term (current) drug therapy: Secondary | ICD-10-CM

## 2013-01-26 DIAGNOSIS — R0902 Hypoxemia: Secondary | ICD-10-CM

## 2013-01-26 DIAGNOSIS — E876 Hypokalemia: Secondary | ICD-10-CM

## 2013-01-26 DIAGNOSIS — Z87891 Personal history of nicotine dependence: Secondary | ICD-10-CM

## 2013-01-26 DIAGNOSIS — E871 Hypo-osmolality and hyponatremia: Secondary | ICD-10-CM

## 2013-01-26 DIAGNOSIS — E538 Deficiency of other specified B group vitamins: Secondary | ICD-10-CM

## 2013-01-26 DIAGNOSIS — IMO0002 Reserved for concepts with insufficient information to code with codable children: Secondary | ICD-10-CM

## 2013-01-26 DIAGNOSIS — J95811 Postprocedural pneumothorax: Principal | ICD-10-CM | POA: Diagnosis present

## 2013-01-26 DIAGNOSIS — R0603 Acute respiratory distress: Secondary | ICD-10-CM

## 2013-01-26 DIAGNOSIS — K449 Diaphragmatic hernia without obstruction or gangrene: Secondary | ICD-10-CM

## 2013-01-26 DIAGNOSIS — J9819 Other pulmonary collapse: Secondary | ICD-10-CM | POA: Diagnosis present

## 2013-01-26 DIAGNOSIS — Z01810 Encounter for preprocedural cardiovascular examination: Secondary | ICD-10-CM

## 2013-01-26 DIAGNOSIS — J449 Chronic obstructive pulmonary disease, unspecified: Secondary | ICD-10-CM

## 2013-01-26 DIAGNOSIS — M1990 Unspecified osteoarthritis, unspecified site: Secondary | ICD-10-CM | POA: Diagnosis present

## 2013-01-26 DIAGNOSIS — N839 Noninflammatory disorder of ovary, fallopian tube and broad ligament, unspecified: Secondary | ICD-10-CM | POA: Diagnosis present

## 2013-01-26 DIAGNOSIS — N83209 Unspecified ovarian cyst, unspecified side: Secondary | ICD-10-CM

## 2013-01-26 DIAGNOSIS — Z720 Tobacco use: Secondary | ICD-10-CM

## 2013-01-26 DIAGNOSIS — J984 Other disorders of lung: Secondary | ICD-10-CM

## 2013-01-26 DIAGNOSIS — N179 Acute kidney failure, unspecified: Secondary | ICD-10-CM

## 2013-01-26 DIAGNOSIS — N838 Other noninflammatory disorders of ovary, fallopian tube and broad ligament: Secondary | ICD-10-CM

## 2013-01-26 DIAGNOSIS — J019 Acute sinusitis, unspecified: Secondary | ICD-10-CM

## 2013-01-26 DIAGNOSIS — K648 Other hemorrhoids: Secondary | ICD-10-CM

## 2013-01-26 DIAGNOSIS — I10 Essential (primary) hypertension: Secondary | ICD-10-CM | POA: Diagnosis present

## 2013-01-26 DIAGNOSIS — K219 Gastro-esophageal reflux disease without esophagitis: Secondary | ICD-10-CM | POA: Diagnosis present

## 2013-01-26 LAB — CBC WITH DIFFERENTIAL/PLATELET
Basophils Absolute: 0 10*3/uL (ref 0.0–0.1)
Basophils Relative: 0 % (ref 0–1)
Eosinophils Absolute: 0 10*3/uL (ref 0.0–0.7)
Eosinophils Relative: 0 % (ref 0–5)
HCT: 41 % (ref 36.0–46.0)
Hemoglobin: 14.9 g/dL (ref 12.0–15.0)
MCH: 31.1 pg (ref 26.0–34.0)
MCHC: 36.3 g/dL — ABNORMAL HIGH (ref 30.0–36.0)
Monocytes Relative: 11 % (ref 3–12)
Neutrophils Relative %: 81 % — ABNORMAL HIGH (ref 43–77)
Platelets: 219 10*3/uL (ref 150–400)
RDW: 14.5 % (ref 11.5–15.5)
WBC: 14.2 10*3/uL — ABNORMAL HIGH (ref 4.0–10.5)

## 2013-01-26 LAB — COMPREHENSIVE METABOLIC PANEL
ALT: 14 U/L (ref 0–35)
AST: 17 U/L (ref 0–37)
Albumin: 3.5 g/dL (ref 3.5–5.2)
Alkaline Phosphatase: 86 U/L (ref 39–117)
BUN: 11 mg/dL (ref 6–23)
Calcium: 10.5 mg/dL (ref 8.4–10.5)
Chloride: 91 mEq/L — ABNORMAL LOW (ref 96–112)
GFR calc Af Amer: >90 mL/min (ref >90)
Potassium: 4.3 mEq/L (ref 3.5–5.1)
Sodium: 130 mEq/L — ABNORMAL LOW (ref 135–145)
Total Protein: 7.5 g/dL (ref 6.0–8.3)

## 2013-01-26 LAB — POCT I-STAT TROPONIN I
Troponin i, poc: 0.01 ng/mL (ref 0.00–0.08)
Troponin i, poc: 0.01 ng/mL (ref 0.00–0.08)

## 2013-01-26 MED ORDER — MIDAZOLAM HCL 2 MG/2ML IJ SOLN
INTRAMUSCULAR | Status: AC | PRN
  Administered 2013-01-26: 2 mg via INTRAVENOUS

## 2013-01-26 MED ORDER — FENTANYL CITRATE 0.05 MG/ML IJ SOLN
INTRAMUSCULAR | Status: AC | PRN
  Administered 2013-01-26: 100 ug via INTRAVENOUS

## 2013-01-26 MED ORDER — SODIUM CHLORIDE 0.9 % IV SOLN: 250.0000 mL | INTRAVENOUS | Status: DC | PRN

## 2013-01-26 MED ORDER — ALBUTEROL SULFATE (5 MG/ML) 0.5% IN NEBU
2.5000 mg | INHALATION_SOLUTION | RESPIRATORY_TRACT | Status: DC
  Administered 2013-01-26 – 2013-01-27 (×7): 2.5 mg via RESPIRATORY_TRACT
  Filled 2013-01-26 (×6): qty 0.5

## 2013-01-26 MED ORDER — TIOTROPIUM BROMIDE MONOHYDRATE 18 MCG IN CAPS
18.0000 ug | ORAL_CAPSULE | Freq: Every day | RESPIRATORY_TRACT | Status: DC
  Administered 2013-01-28 – 2013-01-30 (×3): 18 ug via RESPIRATORY_TRACT
  Filled 2013-01-26 (×2): qty 5

## 2013-01-26 MED ORDER — VERAPAMIL HCL 180 MG (CO) PO TB24: 180.0000 mg | ORAL_TABLET | Freq: Every day | ORAL | Status: DC

## 2013-01-26 MED ORDER — METHYLPREDNISOLONE SODIUM SUCC 40 MG IJ SOLR
40.0000 mg | Freq: Two times a day (BID) | INTRAMUSCULAR | Status: DC
  Administered 2013-01-26 – 2013-01-28 (×4): 40 mg via INTRAVENOUS
  Filled 2013-01-26 (×6): qty 1

## 2013-01-26 MED ORDER — VERAPAMIL HCL ER 180 MG PO TBCR
180.0000 mg | EXTENDED_RELEASE_TABLET | Freq: Every day | ORAL | Status: DC
  Administered 2013-01-26 – 2013-01-27 (×2): 180 mg via ORAL
  Filled 2013-01-26 (×3): qty 1

## 2013-01-26 MED ORDER — CEFUROXIME SODIUM 750 MG IJ SOLR
750.0000 mg | Freq: Three times a day (TID) | INTRAMUSCULAR | Status: DC
  Administered 2013-01-26 – 2013-01-30 (×12): 750 mg via INTRAVENOUS
  Filled 2013-01-26 (×14): qty 750

## 2013-01-26 MED ORDER — MIDAZOLAM HCL 2 MG/2ML IJ SOLN
INTRAMUSCULAR | Status: AC
  Filled 2013-01-26: qty 2

## 2013-01-26 MED ORDER — ENOXAPARIN SODIUM 40 MG/0.4ML ~~LOC~~ SOLN
40.0000 mg | SUBCUTANEOUS | Status: DC
  Administered 2013-01-26 – 2013-01-30 (×5): 40 mg via SUBCUTANEOUS
  Filled 2013-01-26 (×5): qty 0.4

## 2013-01-26 MED ORDER — ACETAMINOPHEN 500 MG PO TABS
500.0000 mg | ORAL_TABLET | Freq: Four times a day (QID) | ORAL | Status: DC | PRN
  Administered 2013-01-26 – 2013-01-29 (×4): 500 mg via ORAL
  Filled 2013-01-26 (×4): qty 1

## 2013-01-26 MED ORDER — MELOXICAM 15 MG PO TABS
15.0000 mg | ORAL_TABLET | Freq: Every day | ORAL | Status: DC
  Administered 2013-01-27 – 2013-01-30 (×4): 15 mg via ORAL
  Filled 2013-01-26 (×4): qty 1

## 2013-01-26 MED ORDER — ALPRAZOLAM 0.25 MG PO TABS
0.2500 mg | ORAL_TABLET | Freq: Every evening | ORAL | Status: DC | PRN
  Administered 2013-01-26: 0.25 mg via ORAL
  Filled 2013-01-26: qty 1

## 2013-01-26 MED ORDER — IPRATROPIUM BROMIDE 0.02 % IN SOLN
0.5000 mg | RESPIRATORY_TRACT | Status: DC
  Administered 2013-01-26 – 2013-01-27 (×7): 0.5 mg via RESPIRATORY_TRACT
  Filled 2013-01-26 (×7): qty 2.5

## 2013-01-26 MED ORDER — LORATADINE 10 MG PO TABS
10.0000 mg | ORAL_TABLET | Freq: Every day | ORAL | Status: DC
  Administered 2013-01-27 – 2013-01-30 (×4): 10 mg via ORAL
  Filled 2013-01-26 (×4): qty 1

## 2013-01-26 MED ORDER — GUAIFENESIN ER 600 MG PO TB12
600.0000 mg | ORAL_TABLET | Freq: Two times a day (BID) | ORAL | Status: DC
  Administered 2013-01-26 – 2013-01-30 (×8): 600 mg via ORAL
  Filled 2013-01-26 (×9): qty 1

## 2013-01-26 MED ORDER — CYANOCOBALAMIN 1000 MCG/ML IJ SOLN
1000.0000 ug | INTRAMUSCULAR | Status: DC
Start: 2013-02-16 — End: 2013-01-29

## 2013-01-26 MED ORDER — TRAMADOL HCL 50 MG PO TABS
50.0000 mg | ORAL_TABLET | Freq: Four times a day (QID) | ORAL | Status: DC | PRN
  Administered 2013-01-26 – 2013-01-28 (×4): 50 mg via ORAL
  Filled 2013-01-26 (×4): qty 1

## 2013-01-26 MED ORDER — POTASSIUM CHLORIDE CRYS ER 20 MEQ PO TBCR
20.0000 meq | EXTENDED_RELEASE_TABLET | Freq: Every day | ORAL | Status: DC
  Administered 2013-01-27 – 2013-01-30 (×4): 20 meq via ORAL
  Filled 2013-01-26 (×4): qty 1

## 2013-01-26 MED ORDER — FENTANYL CITRATE 0.05 MG/ML IJ SOLN
INTRAMUSCULAR | Status: AC
  Filled 2013-01-26: qty 2

## 2013-01-26 NOTE — Procedures (Signed)
Chest Tube Insertion Procedure Note  Indications:  Clinically significant Pneumothorax  Pre-operative Diagnosis: Pneumothorax  Post-operative Diagnosis: Pneumothorax  Procedure Details  Informed consent was obtained for the procedure, including sedation.  Risks of lung perforation, hemorrhage, arrhythmia, and adverse drug reaction were discussed.   After sterile skin prep, using standard technique, a [name] was placed in the left lateral 8th rib space.  Findings: None  Estimated Blood Loss:  Minimal         Specimens:  None              Complications:  None; patient tolerated the procedure well.         Disposition: admit to stepdown         Condition: stable  Attending Attestation: I was present for the entire procedure.   Dorcas Carrow Beeper  [phone]  Cell  [phone]  If no response or cell goes to voicemail, call beeper [phone]

## 2013-01-26 NOTE — ED Notes (Signed)
Pt moved to Trauma B in order to place Chest Tube.

## 2013-01-26 NOTE — Progress Notes (Signed)
Admission note:   Arrival Method: Via stretcher from ED. Mental Status: A&OX4. Telemetry: N/A  Skin: Ecchymotic areas on bilat upper extremities. Incision to left rib area.  Tubes: Chest tube on left side. IV: RLFA 01/26/13 NSL. Pain: 6/10 - awaiting PRN meds to be verified by pharmacist.  Family: At bedside. Living Situation: Home alone. Safety Measures: Call bell within reach. Red socks. Fall prevention safety plan. BSC nearby. 6E Orientation: Oriented to unit and surroundings.   Shawna [name], Shawna [name] [place]

## 2013-01-26 NOTE — ED Provider Notes (Signed)
CSN: [id number]     Arrival date & time 01/26/13  0630 History   None    Chief Complaint  Patient presents with  . Shortness of Breath   (Consider location/radiation/quality/duration/timing/severity/associated sxs/prior Treatment)  HPI  Shawna [name] is a very pleasant 67 yo woman with oxygen dependent COPD who was released from this hospital 2d ago after admission for a left traumatic pneumothorax which was a complication of bronchoscopy performed on Jan 19, 2013. The patient was admitted on Nov 4th and had a left tube thoracostomy. The tube was pulled on Nov 6th and she was discharged on Nov 7th.  However, yesterday, Nov 9th, the patient developed increasingly severe SOB which would worsen with bending at the waist. She was unable to sleep overnight due to increasingly severe sx. She denies fever. She denies chest pain. She has a chronic, productive cough which is unchanged. Although, dtr says she was recently started on Augmentin for suspected pneumonia.   Past Medical History  Diagnosis Date  . COPD (chronic obstructive pulmonary disease)   . HTN (hypertension)   . B12 deficiency   . OA (osteoarthritis)   . Hypokalemia   . Mass of lung   . GERD (gastroesophageal reflux disease)   . Diverticulosis of colon (without mention of hemorrhage)   . 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 Pulmonary nodule    Past Surgical History  Procedure Laterality Date  . Carpal tunnel release      bilateral  . Tubal ligation      x 2  . Cervical spine surgery    . Lung surgery Right     per patient Dr Dorris Fetch  . Video bronchoscopy Bilateral 01/20/2013    Procedure: VIDEO BRONCHOSCOPY WITH FLUORO;  Surgeon: Oretha Milch, MD;  Location: [facility] ENDOSCOPY;  Service: Cardiopulmonary;  Laterality: Bilateral;   Family History  Problem Relation Age of Onset  . Coronary artery disease Mother 28  . Alzheimer's disease Mother   . Colon cancer     History  Substance Use Topics  . Smoking  status: Former Smoker -- 1.00 packs/day for 40 years    Quit date: 06/30/2011  . Smokeless tobacco: Never Used  . Alcohol Use: No   OB History   Grav Para Term Preterm Abortions TAB SAB Ect Mult Living                 Review of Systems 10 point ROS performed and is negative with the exception of sx noted above.   Allergies  Aspirin and Percodan  Home Medications   Current Outpatient Rx  Name  Route  Sig  Dispense  Refill  . acetaminophen (TYLENOL) 500 MG tablet   Oral   Take 500 mg by mouth every 6 (six) hours as needed for moderate pain.          Marland Kitchen albuterol (PROVENTIL HFA;VENTOLIN HFA) 108 (90 BASE) MCG/ACT inhaler   Inhalation   Inhale 2 puffs into the lungs every 4 (four) hours as needed. For shortness of breath         . albuterol (PROVENTIL) (2.5 MG/3ML) 0.083% nebulizer solution   Nebulization   Take 2.5 mg by nebulization 4 (four) times daily. For shortness of breath         . ALPRAZolam (XANAX) 0.25 MG tablet   Oral   Take 0.25 mg by mouth at bedtime as needed for sleep.          Marland Kitchen amoxicillin-clavulanate (AUGMENTIN) 875-125  MG per tablet   Oral   Take 1 tablet by mouth 2 (two) times daily.   10 tablet   0   . cyanocobalamin (,VITAMIN B-12,) 1000 MCG/ML injection   Intramuscular   Inject 1,000 mcg into the muscle every 30 (thirty) days. Given on the 10th of each month         . guaiFENesin (MUCINEX) 600 MG 12 hr tablet   Oral   Take 600 mg by mouth 2 (two) times daily.          Marland Kitchen KLOR-CON M20 20 MEQ tablet   Oral   Take 20 mEq by mouth daily.          Marland Kitchen loratadine (CLARITIN) 10 MG tablet   Oral   Take 1 tablet (10 mg total) by mouth daily.   30 tablet   0   . meloxicam (MOBIC) 15 MG tablet   Oral   Take 15 mg by mouth daily.          . predniSONE (DELTASONE) 10 MG tablet      Take 4 tabs qd x 2d, then 3 tab qd x 2d, then 2 tab qd x 2d, then 1 tab po qd x 2d   20 tablet   0   . tiotropium (SPIRIVA HANDIHALER) 18 MCG  inhalation capsule   Inhalation   Place 1 capsule (18 mcg total) into inhaler and inhale daily.   30 capsule   12   . verapamil (COVERA HS) 180 MG (CO) 24 hr tablet   Oral   Take 180 mg by mouth at bedtime.          BP 174/105  Pulse 100  Temp(Src) 97.7 F (36.5 C) (Oral)  Resp 22  SpO2 92% Physical Exam Gen: chronically ill appearing in acute distress Head: NCAT Eyes: PERLA, EOMI Mouth: normal to inspection Neck: no stridor, trachea midline Lungs: diminished BS throughout the left, BS CTA right base and diminished at apex, accessory muscle use, very subtle retractions, RR 28/min CV: rapid and regular, good peripheral pulses Abd: soft, nontender, nondistended Back: kyphosis, no ttp Ext: normal to inspection Neuro: CN II-XII grossly intact, no focal deficits, normal speech, gait not observed  Psyche: appears appropriately anxious in light of respiratory distress.   ED Course  Procedures (including critical care time) Labs Review  No results found for this or any previous visit (from the past 24 hour(s)).  EKG: sinus tach, no acute ischemic changes, normal qrs, normal ST T segment, no acute change from previous  CXR: large left sided pneumothorax, trachea midline   MDM   Patient with recurrent left pneumothorax following removal of left tube thoracostomy 2 d ago. The patient is maintaining adequate 02 sats on 4L of O2 via [place]. I have discussed case with the the CC team. We are awaiting a [name] PTX kit and the CC team with place and admit.    Brandt Loosen, MD 01/26/13 [phone]

## 2013-01-26 NOTE — ED Notes (Signed)
Per Patient: Pt reports she started having SOB yesterday. Pt reports she was recently hospitalized with a pneumothorax. Pt reports the SOB became worse overnight. Currently Ax4, NAD.

## 2013-01-26 NOTE — ED Provider Notes (Deleted)
MSE was initiated and I personally evaluated the patient and placed orders (if any) at  6:44 AM on January 26, 2013.  The patient appears stable so that the remainder of the MSE may be completed by another provider. I have initiated orders for EKG, CXR, CBC, CMP, blood cultures, supplemental oxygen.   Brandt Loosen, MD 01/26/13 [phone]

## 2013-01-26 NOTE — Progress Notes (Signed)
Received report at 10am.   Shawna [name], [place]

## 2013-01-26 NOTE — Telephone Encounter (Signed)
Phone call from patient's daughter, Luster Landsberg, who is on her way to her mother's house.  Patient recently d/ced from hospital on Saturday 11/8 following hospitalization on Wedneday 11/5  for a L PTX requiring a CT associated with a bronch performed on Tuesday 11/4.  According to daughter the patient is so SOB she cannot talk in complete sentences.  She was planning on bringing her mother to the [facility] but the mother wanted her daughter to call the MD first.  Review of the [facility] shows no residual PTX on 11/7.  I instructed the daughter to bring her mother to the [facility] for evaluation.  If her mother looks bad when the daughter arrives to the patient's home she is to call EMS for transport.

## 2013-01-26 NOTE — Progress Notes (Signed)
Paged Dr. Vassie Loll about pt's arrival to unit. Verified that chest tube be hooked up to continuous suction. Will continue to monitor.  Shawna [name], Shawna [name]

## 2013-01-26 NOTE — ED Notes (Signed)
Admitting MD at bedside.

## 2013-01-26 NOTE — Telephone Encounter (Signed)
Pt admitted by me at [facility]

## 2013-01-26 NOTE — H&P (Signed)
PULMONARY  / CRITICAL CARE MEDICINE  Name: Shawna [name] MRN: [id number] DOB: Jul 20, 1945    ADMISSION DATE:  01/26/2013  CHIEF COMPLAINT:  Shortness of breath   HISTORY OF PRESENT ILLNESS:  67 year old ex smoker with COPD -gold B, RUL lobectomy in April 2013 for benign lesion (hendrickson) adm 11/10 for recurrent left pneumothorax after left transbronchial biopsy for PET positive nodule & LL atelectasis.   Sh epresented with PET positive Lt lower lung nodule increasing in size s/p bronchoscopy 11/4 and developed Lt PTX after procedure. Therapeutic interventions included: Chest tube placement on 11/5   and serial CXRs. PTX resolved. Pt transitioned to water seal, then CT was removed on 11/7 F/U CXR on day of d/c 11/8 with out ptx.  In addition to PTX treatment: the pathology results from the bronchoscopy were reported as negative for malignancy but did show atypical cells. 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 And she was started on rocephin. In addition to rocephin she was also treated w/ pred taper for mild COPD flare. She has improved with pred, BDs and abx.  Only additional concern of note is of on-going hyponatremia. Her urine lytes, cortisol, and Tsh were evaluated. We felt that this was likely due to her HCTZ which was held at time of d/c Left ovary could not be seen on pelvic US to clarify PET positivity noted   On Nov 9th, the patient developed increasingly severe SOB which would worsen with bending at the waist. She was unable to sleep overnight - CXR in ED showed larger left pnthx & wayne chest tube was emergently inserted  PAST MEDICAL HISTORY :  Past Medical History  Diagnosis Date  . COPD (chronic obstructive pulmonary disease)   . HTN (hypertension)   . B12 deficiency   . OA (osteoarthritis)   . Hypokalemia   . Mass of lung   . GERD (gastroesophageal reflux disease)   . Diverticulosis of colon (without mention of hemorrhage)   . 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 Pulmonary nodule    Past Surgical History  Procedure Laterality Date  . Carpal tunnel release      bilateral  . Tubal ligation      x 2  . Cervical spine surgery    . Lung surgery Right     per patient Dr Dorris Fetch  . Video bronchoscopy Bilateral 01/20/2013    Procedure: VIDEO BRONCHOSCOPY WITH FLUORO;  Surgeon: Oretha Milch, MD;  Location: [facility] ENDOSCOPY;  Service: Cardiopulmonary;  Laterality: Bilateral;   Prior to Admission medications   Medication Sig Start Date End Date Taking? Authorizing Provider  acetaminophen (TYLENOL) 500 MG tablet Take 500 mg by mouth every 6 (six) hours as needed for moderate pain.    Yes Historical Provider, MD  albuterol (PROVENTIL HFA;VENTOLIN HFA) 108 (90 BASE) MCG/ACT inhaler Inhale 2 puffs into the lungs every 4 (four) hours as needed. For shortness of breath 02/08/12  Yes Shanker Levora Dredge, MD  albuterol (PROVENTIL) (2.5 MG/3ML) 0.083% nebulizer solution Take 2.5 mg by nebulization 4 (four) times daily. For shortness of breath 02/08/12  Yes Shanker Levora Dredge, MD  ALPRAZolam [name]) 0.25 MG tablet Take 0.25 mg by mouth at bedtime as needed for sleep.    Yes Historical Provider, MD  amoxicillin-clavulanate (AUGMENTIN) 875-125 MG per tablet Take 1 tablet by mouth 2 (two) times daily. 01/24/13  Yes Simonne Martinet, NP  cyanocobalamin (,VITAMIN B-12,) 1000 MCG/ML injection Inject 1,000 mcg into  the muscle every 30 (thirty) days. Given on the 10th of each month   Yes Historical Provider, MD  guaiFENesin (MUCINEX) 600 MG 12 hr tablet Take 600 mg by mouth 2 (two) times daily.  02/08/12  Yes Shanker Levora Dredge, MD  KLOR-CON M20 20 MEQ tablet Take 20 mEq by mouth daily.  05/11/11  Yes Historical Provider, MD  loratadine (CLARITIN) 10 MG tablet Take 1 tablet (10 mg total) by mouth daily. 02/08/12  Yes Shanker Levora Dredge, MD  meloxicam (MOBIC) 15 MG tablet Take 15 mg by mouth daily.    Yes Historical Provider, MD  predniSONE (DELTASONE) 10 MG tablet  Take 4 tabs qd x 2d, then 3 tab qd x 2d, then 2 tab qd x 2d, then 1 tab po qd x 2d 01/24/13  Yes Simonne Martinet, NP  tiotropium (SPIRIVA HANDIHALER) 18 MCG inhalation capsule Place 1 capsule (18 mcg total) into inhaler and inhale daily. 02/08/12  Yes Shanker Levora Dredge, MD  verapamil (COVERA HS) 180 MG (CO) 24 hr tablet Take 180 mg by mouth at bedtime.   Yes Historical Provider, MD   Allergies  Allergen Reactions  . Aspirin Swelling    Lips and face swelled.  Warden Fillers [Oxycodone-Aspirin] Other (See Comments)    "went out of my head"    FAMILY HISTORY:  Family History  Problem Relation Age of Onset  . Coronary artery disease Mother 41  . Alzheimer's disease Mother   . Colon cancer     SOCIAL HISTORY:  reports that she quit smoking about 18 months ago. She has never used smokeless tobacco. She reports that she does not drink alcohol or use illicit drugs.  REVIEW OF SYSTEMS:  POsitive noted above Constitutional: Negative for fever, chills, weight loss, malaise/fatigue and diaphoresis.  HENT: Negative for hearing loss, ear pain, nosebleeds, congestion, sore throat, neck pain, tinnitus and ear discharge.   Eyes: Negative for blurred vision, double vision, photophobia, pain, discharge and redness.  Respiratory: Negative for hemoptysis, sputum production,  and stridor.   Cardiovascular: Negative for chest pain, palpitations, orthopnea, claudication, leg swelling and PND.  Gastrointestinal: Negative for heartburn, nausea, vomiting, abdominal pain, diarrhea, constipation, blood in stool and melena.  Genitourinary: Negative for dysuria, urgency, frequency, hematuria and flank pain.  Musculoskeletal: Negative for myalgias, back pain, joint pain and falls.  Skin: Negative for itching and rash.  Neurological: Negative for dizziness, tingling, tremors, sensory change, speech change, focal weakness, seizures, loss of consciousness, weakness and headaches.  Endo/Heme/Allergies: Negative for  environmental allergies and polydipsia. Does not bruise/bleed easily.  SUBJECTIVE:   VITAL SIGNS: Temp:  [97.7 F (36.5 C)] 97.7 F (36.5 C) (11/10 0637) Pulse Rate:  [82-102] 82 (11/10 0800) Resp:  [19-25] 19 (11/10 0800) BP: (149-174)/(89-105) 152/89 mmHg (11/10 0800) SpO2:  [79 %-99 %] 99 % (11/10 0800)  PHYSICAL EXAMINATION: Gen. Pleasant, well-nourished, in mild distress, anxious affect, c/o chest pain at tube site ENT - no lesions, no post nasal drip Neck: No JVD, no thyromegaly, no carotid bruits Lungs: no use of accessory muscles, no dullness to percussion, decreased left with faint  rhonchi , chest tube has air leak Cardiovascular: Rhythm regular, heart sounds  normal, no murmurs, no peripheral edema Abdomen: soft and non-tender, no hepatosplenomegaly, BS normal. Musculoskeletal: No deformities, no cyanosis or clubbing Neuro:  alert, non focal Skin:  Warm, no lesions/ rash    Recent Labs Lab 01/23/13 0534 01/24/13 0545 01/26/13 0710  NA 129* 130* 130*  K 3.7  3.7 4.3  CL 94* 91* 91*  CO2 26 28 29   BUN 9 9 11   CREATININE 0.72 0.75 0.76  GLUCOSE 115* 77 99    Recent Labs Lab 01/21/13 1620 01/26/13 0710  HGB 13.4 14.9  HCT 37.6 41.0  WBC 7.9 14.2*  PLT 171 219   Dg Chest Portable 1 View  01/26/2013   CLINICAL DATA:  Left pneumothorax, chest tube insertion  EXAM: PORTABLE CHEST - 1 VIEW  COMPARISON:  01/26/2013  FINDINGS: Pigtail type left chest tube has been inserted. There is improvement in the left pneumothorax. Small left apical component remains. Retrocardiac left lower lobe atelectasis/ consolidation process. Stable postoperative appearance of the right hemi thorax with volume loss and pleural scarring. Stable heart size and vascularity. Aortic atherosclerosis noted. Lower cervical fusion hardware present.  IMPRESSION: Interval left chest tube insertion. Small residual left apical pneumothorax persists.   Electronically Signed   By: Ruel Favors M.D.   On:  01/26/2013 08:09   Dg Chest Portable 1 View  01/26/2013   CLINICAL DATA:  Shortness of breath.  EXAM: PORTABLE CHEST - 1 VIEW  COMPARISON:  01/24/2013.  FINDINGS: Prominent tension pneumothorax the left. Stable elevation of the right hemidiaphragm noted most consistent with scarring. Right lung is clear. Mediastinum unremarkable. Surgical clips are noted over the right hilum. No mass noted. Heart size and pulmonary vascularity normal. Prior cervical spine fusion. Surgical clip right chest wall. Surgical tubing noted over right chest wall.  IMPRESSION: Prominent pneumothorax on the left. Critical Value/emergent results were called by telephone at the time of interpretation on 01/26/2013 at 7:21 AM to Dr.JULIE MANLY , who verbally acknowledged these results.   Electronically Signed   By: Maisie Fus  Register   On: 01/26/2013 07:21    ASSESSMENT / PLAN:  A:  Lt PTX after bronchoscopy >> resolved on CXR 11/07 with tube, recurred on 11/10 P:  -left chest tube to suction -20 cm   A:  Lt lower lung nodule >> atypical cells on BA, brushing >> surgical path negative for malignancy.  P:  -will need FU imaging  A: LLL PNA - moraxella on BAL .  AECOPD.  P:  - albuterol Reita May nebs -solumedrol 40 bid -resume cefuroxime while sick, change back to PO A:  Hyponatremia.  P:  -held HCTZ  -TSH, cortisol ok, urine lytes ok  A: Hx of HTN  P:  -prn hydralazine for SBP > 170  -continue calan   A:  PET positive Lt ovarian mass.  P:  -may need CT pelvis at some point  Updated daughters & pt in detail at bedside  [facility].  Pulmonary and Critical Care Medicine [facility] Pager: [phone]  01/26/2013, 8:40 AM

## 2013-01-27 ENCOUNTER — Ambulatory Visit: Payer: Medicare Other | Admitting: Pulmonary Disease

## 2013-01-27 ENCOUNTER — Inpatient Hospital Stay (HOSPITAL_COMMUNITY): Payer: Medicare Other

## 2013-01-27 DIAGNOSIS — J441 Chronic obstructive pulmonary disease with (acute) exacerbation: Secondary | ICD-10-CM

## 2013-01-27 DIAGNOSIS — J9620 Acute and chronic respiratory failure, unspecified whether with hypoxia or hypercapnia: Secondary | ICD-10-CM

## 2013-01-27 MED ORDER — ALBUTEROL SULFATE (5 MG/ML) 0.5% IN NEBU
2.5000 mg | INHALATION_SOLUTION | Freq: Four times a day (QID) | RESPIRATORY_TRACT | Status: DC
  Administered 2013-01-27 – 2013-01-29 (×7): 2.5 mg via RESPIRATORY_TRACT
  Filled 2013-01-27 (×7): qty 0.5

## 2013-01-27 MED ORDER — IPRATROPIUM BROMIDE 0.02 % IN SOLN
0.5000 mg | Freq: Four times a day (QID) | RESPIRATORY_TRACT | Status: DC
  Administered 2013-01-27 – 2013-01-28 (×4): 0.5 mg via RESPIRATORY_TRACT
  Filled 2013-01-27 (×4): qty 2.5

## 2013-01-27 NOTE — Progress Notes (Addendum)
PULMONARY  / CRITICAL CARE MEDICINE  Name: Shawna [name] MRN: [id number] DOB: January 04, 1946    ADMISSION DATE:  01/26/2013  CHIEF COMPLAINT:  Shortness of breath   HISTORY OF PRESENT ILLNESS:  67 year old ex smoker with COPD -gold B, RUL lobectomy in April 2013 for benign lesion (hendrickson) adm 11/10 for recurrent left pneumothorax after left transbronchial biopsy for PET positive nodule & LL atelectasis.   Sh epresented with PET positive Lt lower lung nodule increasing in size s/p bronchoscopy 11/4 and developed Lt PTX after procedure. Therapeutic interventions included: Chest tube placement on 11/5   and serial CXRs. PTX resolved. Pt transitioned to water seal, then CT was removed on 11/7 F/U CXR on day of d/c 11/8 with out ptx.  In addition to PTX treatment: the pathology results from the bronchoscopy were reported as negative for malignancy but did show atypical cells. 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 And she was started on rocephin. In addition to rocephin she was also treated w/ pred taper for mild COPD flare. She has improved with pred, BDs and abx.  Only additional concern of note is of on-going hyponatremia. Her urine lytes, cortisol, and Tsh were evaluated. We felt that this was likely due to her HCTZ which was held at time of d/c Left ovary could not be seen on pelvic US to clarify PET positivity noted   On Nov 9th, the patient developed increasingly severe SOB which would worsen with bending at the waist. She was unable to sleep overnight - CXR in ED showed larger left pnthx & wayne chest tube was emergently inserted   SUBJECTIVE: afebrile C/o left chest pain Cough -better  VITAL SIGNS: Temp:  [97.6 F (36.4 C)-98.4 F (36.9 C)] 98.1 F (36.7 C) (11/11 0900) Pulse Rate:  [80-91] 91 (11/11 0900) Resp:  [18-20] 20 (11/11 0900) BP: (125-146)/(75-86) 131/86 mmHg (11/11 0900) SpO2:  [92 %-96 %] 94 % (11/11 1151) Weight:  [119 lb (53.978 kg)] 119 lb (53.978 kg)  (11/10 1500)  PHYSICAL EXAMINATION: Gen. Pleasant, well-nourished, in mild distress, anxious affect, c/o chest pain at tube site ENT - no lesions, no post nasal drip Neck: No JVD, no thyromegaly, no carotid bruits Lungs: no use of accessory muscles, no dullness to percussion, decreased left with faint  rhonchi , chest tube -no air leak Cardiovascular: Rhythm regular, heart sounds  normal, no murmurs, no peripheral edema Abdomen: soft and non-tender, no hepatosplenomegaly, BS normal. Musculoskeletal: No deformities, no cyanosis or clubbing Neuro:  alert, non focal Skin:  Warm, no lesions/ rash    Recent Labs Lab 01/23/13 0534 01/24/13 0545 01/26/13 0710  NA 129* 130* 130*  K 3.7 3.7 4.3  CL 94* 91* 91*  CO2 26 28 29   BUN 9 9 11   CREATININE 0.72 0.75 0.76  GLUCOSE 115* 77 99    Recent Labs Lab 01/21/13 1620 01/26/13 0710  HGB 13.4 14.9  HCT 37.6 41.0  WBC 7.9 14.2*  PLT 171 219   Dg Chest Port 1 View  01/27/2013   CLINICAL DATA:  Left-sided chest tube  EXAM: PORTABLE CHEST - 1 VIEW  COMPARISON:  01/26/2013 (multiple examination) ; 01/24/2013; 01/23/2013; PET-CT 10/27/2018 05/2012  FINDINGS: Grossly unchanged cardiac silhouette and mediastinal contours with postsurgical change and mild nodularity of the right hilum. Stable positioning of support apparatus. No change to slight decrease in tiny left apical pneumothorax. There is a very minimal amount of left lateral chest wall subcutaneous emphysema. Suspected trace bilateral  effusions are unchanged. Minimally improved aeration of the left lower lung with persistent left basilar opacities. Persistent mild elevation of the right hemidiaphragm compatible with volume loss. Grossly unchanged bones including lower cervical ACDF, incompletely imaged.  IMPRESSION: 1. Stable positioning of support apparatus with no change to slight decrease in tiny left apical pneumothorax. 2. Improved aeration of the left lower lung with persistent  opacities, favored to represent atelectasis. 3. Stable postsurgical change of the right hilum with associated right lung volume loss.   Electronically Signed   By: Simonne Come M.D.   On: 01/27/2013 08:10   Dg Chest Portable 1 View  01/26/2013   CLINICAL DATA:  Left pneumothorax, chest tube insertion  EXAM: PORTABLE CHEST - 1 VIEW  COMPARISON:  01/26/2013  FINDINGS: Pigtail type left chest tube has been inserted. There is improvement in the left pneumothorax. Small left apical component remains. Retrocardiac left lower lobe atelectasis/ consolidation process. Stable postoperative appearance of the right hemi thorax with volume loss and pleural scarring. Stable heart size and vascularity. Aortic atherosclerosis noted. Lower cervical fusion hardware present.  IMPRESSION: Interval left chest tube insertion. Small residual left apical pneumothorax persists.   Electronically Signed   By: Ruel Favors M.D.   On: 01/26/2013 08:09   Dg Chest Portable 1 View  01/26/2013   CLINICAL DATA:  Shortness of breath.  EXAM: PORTABLE CHEST - 1 VIEW  COMPARISON:  01/24/2013.  FINDINGS: Prominent tension pneumothorax the left. Stable elevation of the right hemidiaphragm noted most consistent with scarring. Right lung is clear. Mediastinum unremarkable. Surgical clips are noted over the right hilum. No mass noted. Heart size and pulmonary vascularity normal. Prior cervical spine fusion. Surgical clip right chest wall. Surgical tubing noted over right chest wall.  IMPRESSION: Prominent pneumothorax on the left. Critical Value/emergent results were called by telephone at the time of interpretation on 01/26/2013 at 7:21 AM to Dr.JULIE MANLY , who verbally acknowledged these results.   Electronically Signed   By: Maisie Fus  Register   On: 01/26/2013 07:21    ASSESSMENT / PLAN:  A: Acute resp failure- resolved Lt PTX after bronchoscopy >> resolved on CXR 11/07 with tube, recurred on 11/10 P:  -keep left chest tube to suction -20  cm one more day until pnthx fully resolves -tramadol for pain prn  A:  Lt lower lung nodule >> atypical cells on BA, brushing >> surgical path negative for malignancy.  P:  -will need FU imaging  A: LLL PNA - moraxella on BAL .  AECOPD.  P:  - albuterol Reita May nebs -solumedrol 40 bid -resume cefuroxime while sick, change back to PO on dc  A:  Hyponatremia. -TSH, cortisol ok, urine lytes ok P:  -held HCTZ  -rechk   A: Hx of HTN  P:  -prn hydralazine for SBP > 170  -continue calan   A:  PET positive Lt ovarian mass.  P:  -may need CT pelvis at some point  Updated  pt in detail at bedside  [facility] V.  Pulmonary and Critical Care Medicine [facility] Pager: [phone]  01/27/2013, 1:45 PM

## 2013-01-27 NOTE — Clinical Documentation Improvement (Signed)
THIS DOCUMENT IS NOT A PERMANENT PART OF THE MEDICAL RECORD  Please update your documentation with the medical record to reflect your response to this query. If you need help knowing how to do this please call [phone].  01/27/13  Dear Dr. Vassie Loll Marton Redwood,  In a better effort to capture your patient's severity of illness, reflect appropriate length of stay and utilization of resources, a review of the patient medical record has revealed the following indicators.    Based on your clinical judgment, please clarify and document in a progress note and/or discharge summary the clinical condition associated with the following supporting information:  In responding to this query please exercise your independent judgment.  The fact that a query is asked, does not imply that any particular answer is desired or expected.  Possible Clinical Conditions?  Acute Respiratory Failure Acute on Chronic Respiratory Failure Chronic Respiratory Failure Other Condition Cannot Clinically Determine   Supporting Information:  Risk Factors:( as per notes) "67 yo woman with oxygen dependent COPD"  Signs&Symptoms:(As per notes) "Pt has COPD Exacerbation with SOB", "pneumothorax"   Diagnostics: Radiology: CXR 11-10 IMPRESSION:  Prominent pneumothorax on the left. Critical Value/emergent results  were called by telephone at the time of interpretation on 01/26/2013  at 7:21 AM to Dr.JULIE MANLY , who verbally acknowledged these  results.  Treatment: Oxygen: O2 concentrations: 4L/M    Respiratory Treatment:   albuterol (PROVENTIL) (5 MG/ML) 0.5% nebulizer solution 2.5 mg   [[id number]]    Ordered Dose: 2.5 mg Route: Nebulization Frequency: Every 4 hours     guaiFENesin (MUCINEX) 12 hr tablet 600 mg   [[id number]]    Ordered Dose: 600 mg Route: Oral Frequency: 2 times daily              You may use possible, probable, or suspect with inpatient documentation. possible, probable, suspected  diagnoses MUST be documented at the time of discharge  Reviewed:  ANSWERED  Thank You,  Nevin Bloodgood RN, BSN, CCDS Clinical Documentation Specialist: [phone] Health Information Management [place]

## 2013-01-28 ENCOUNTER — Inpatient Hospital Stay (HOSPITAL_COMMUNITY): Payer: Medicare Other

## 2013-01-28 LAB — CBC
HCT: 38.5 % (ref 36.0–46.0)
MCH: 30.1 pg (ref 26.0–34.0)
MCHC: 35.1 g/dL (ref 30.0–36.0)
MCV: 85.7 fL (ref 78.0–100.0)
Platelets: 225 10*3/uL (ref 150–400)
RBC: 4.49 MIL/uL (ref 3.87–5.11)

## 2013-01-28 LAB — BASIC METABOLIC PANEL
BUN: 16 mg/dL (ref 6–23)
CO2: 28 mEq/L (ref 19–32)
Calcium: 9.5 mg/dL (ref 8.4–10.5)
Creatinine, Ser: 0.8 mg/dL (ref 0.50–1.10)
Glucose, Bld: 115 mg/dL — ABNORMAL HIGH (ref 70–99)
Sodium: 129 mEq/L — ABNORMAL LOW (ref 135–145)

## 2013-01-28 MED ORDER — VERAPAMIL HCL ER 240 MG PO TBCR
240.0000 mg | EXTENDED_RELEASE_TABLET | Freq: Every day | ORAL | Status: DC
  Administered 2013-01-28 – 2013-01-30 (×3): 240 mg via ORAL
  Filled 2013-01-28 (×4): qty 1

## 2013-01-28 MED ORDER — PREDNISONE 20 MG PO TABS
40.0000 mg | ORAL_TABLET | Freq: Every day | ORAL | Status: DC
  Administered 2013-01-28 – 2013-01-29 (×2): 40 mg via ORAL
  Filled 2013-01-28 (×3): qty 2

## 2013-01-28 NOTE — Progress Notes (Addendum)
PULMONARY  / CRITICAL CARE MEDICINE  Name: Shawna [name] MRN: [id number] DOB: 1945/05/26    ADMISSION DATE:  01/26/2013  CHIEF COMPLAINT:  Shortness of breath   Brief Summary:  67 year old ex smoker with COPD - gold B, RUL lobectomy in April 2013 for benign lesion (hendrickson) adm 11/10 for recurrent left pneumothorax after left transbronchial biopsy for PET positive nodule & LL atelectasis.   She presented with PET positive Lt lower lung nodule increasing in size s/p bronchoscopy 11/4 and developed Lt PTX after procedure. Therapeutic interventions included: Chest tube placement on 11/5   and serial CXRs. PTX resolved. Pt transitioned to water seal, then CT was removed on 11/7 F/U CXR on day of d/c 11/8 with out ptx. In addition to PTX treatment: the pathology results from the bronchoscopy were reported as negative for malignancy but did show atypical cells. 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 And she was started on rocephin & pred taper for COPD flare.   Only additional concern of note is of on-going hyponatremia. Her urine lytes, cortisol, and Tsh were evaluated. We felt that this was likely due to her HCTZ which was held at time of d/cLeft ovary could not be seen on pelvic US to clarify PET positive.   On Nov 9th, the patient developed increasingly severe SOB which would worsen with bending at the waist. She was unable to sleep overnight - CXR in ED showed larger left pnthx & wayne chest tube was emergently inserted   SUBJECTIVE: afebrile, C/o left chest pain, Cough -better  VITAL SIGNS: Temp:  [97.4 F (36.3 C)-99 F (37.2 C)] 98.9 F (37.2 C) (11/12 0921) Pulse Rate:  [80-102] 102 (11/12 0921) Resp:  [18-20] 20 (11/12 0921) BP: (133-158)/(76-97) 134/97 mmHg (11/12 0921) SpO2:  [94 %-97 %] 95 % (11/12 0921) Weight:  [121 lb 4.1 oz (55 kg)] 121 lb 4.1 oz (55 kg) (11/11 2107)  PHYSICAL EXAMINATION: Gen. Pleasant, well-nourished, in mild distress, appears more relaxed,  c/o chest pain at tube site, but states it is better than it was. ENT - no lesions, no post nasal drip Neck: No JVD, no thyromegaly, no carotid bruits Lungs: no use of accessory muscles, no dullness to percussion, decreased left with faint  rhonchi , chest tube -no air leak, minimal drainage. States she is breathing better on 2L N/C ( Baseline at home) Cardiovascular: Rhythm regular, heart sounds  normal, no murmurs, no peripheral edema Abdomen: soft and non-tender, no hepatosplenomegaly, BS normal. Musculoskeletal: No deformities, no cyanosis or clubbing Neuro:  Alert and appropriate, non focal Skin:  Warm, no lesions/ rash    Recent Labs Lab 01/24/13 0545 01/26/13 0710 01/28/13 0520  NA 130* 130* 129*  K 3.7 4.3 4.8  CL 91* 91* 91*  CO2 28 29 28   BUN 9 11 16   CREATININE 0.75 0.76 0.80  GLUCOSE 77 99 115*    Recent Labs Lab 01/21/13 1620 01/26/13 0710 01/28/13 0520  HGB 13.4 14.9 13.5  HCT 37.6 41.0 38.5  WBC 7.9 14.2* 12.7*  PLT 171 219 225   Dg Chest Port 1 View  01/28/2013   CLINICAL DATA:  Followup left pneumothorax  EXAM: PORTABLE CHEST - 1 VIEW  COMPARISON:  01/27/2013  FINDINGS: Left-sided pigtail pleural catheter unchanged.  Minimal left pneumothorax shows improvement.  Small left effusion and left lower lobe atelectasis unchanged. Postop changes on the right with pleural scarring unchanged.  IMPRESSION: Interval improvement in small left apical pneumothorax.  Small  left effusion and left lower lobe atelectasis unchanged.   Electronically Signed   By: Marlan Palau M.D.   On: 01/28/2013 08:27   Dg Chest Port 1 View  01/27/2013   CLINICAL DATA:  Left-sided chest tube  EXAM: PORTABLE CHEST - 1 VIEW  COMPARISON:  01/26/2013 (multiple examination) ; 01/24/2013; 01/23/2013; PET-CT 10/27/2018 05/2012  FINDINGS: Grossly unchanged cardiac silhouette and mediastinal contours with postsurgical change and mild nodularity of the right hilum. Stable positioning of support  apparatus. No change to slight decrease in tiny left apical pneumothorax. There is a very minimal amount of left lateral chest wall subcutaneous emphysema. Suspected trace bilateral effusions are unchanged. Minimally improved aeration of the left lower lung with persistent left basilar opacities. Persistent mild elevation of the right hemidiaphragm compatible with volume loss. Grossly unchanged bones including lower cervical ACDF, incompletely imaged.  IMPRESSION: 1. Stable positioning of support apparatus with no change to slight decrease in tiny left apical pneumothorax. 2. Improved aeration of the left lower lung with persistent opacities, favored to represent atelectasis. 3. Stable postsurgical change of the right hilum with associated right lung volume loss.   Electronically Signed   By: Simonne Come M.D.   On: 01/27/2013 08:10    ASSESSMENT / PLAN:  A:  Acute resp failure - resolved Lt PTX after bronchoscopy >> resolved on CXR 11/07 with tube, recurred on 11/10 P:  -CT to water seal as PTX is minimal and improving per CXR -Obtain CXR  if develops pain, tachypnea -tramadol for pain prn -follow up CXR 11/13  A:  Lt lower lung nodule >> atypical cells on BA, brushing >> surgical path negative for malignancy.  P:  -will need FU imaging  A: LLL PNA - moraxella on BAL .  AECOPD.  P:  -albuterol nebs -solumedrol to prednisone 40 mg q am -continue cefuroxime while inpatient, change back to PO on d/c  A:  Hyponatremia. -TSH, cortisol ok, urine lytes ok P:  -hold HCTZ  -trend lytes  A: Hx of HTN  P:  -prn hydralazine for SBP > 170  -increase calan to 240 QHS, monitor for reflex edema   A:  PET positive Lt ovarian mass.  P:  -may need CT pelvis at some point   Overall feels better today. Will place chest tube to water seal and have nursing ambulate patient in hall with assist.  Scribed by Kandice Robinsons, RN ACNP Student [employer] for Canary Brim, NP-C.  Canary Brim,  NP-C [facility] Pgr: [phone] or [phone]    01/28/2013, 10:12 AM    PCCM ATTENDING: I have interviewed and examined the patient and reviewed the database. I have formulated the assessment and plan as reflected in the note above with amendments made by me.   Billy Fischer, MD;  PCCM service; Mobile [phone]

## 2013-01-28 NOTE — Progress Notes (Signed)
Notified Dr. Sung Amabile about pt's BP 164/103. No new orders. Stated he would adjust her medications in the morning. Will continue to monitor.  Shawna [name], [facility] [place]

## 2013-01-28 NOTE — Progress Notes (Signed)
Pt has ambulated to the bathroom a total of 3 times and ambulated partially down the hallway one time. Pt ambulated on RA, without dyspnea or SOB. States she feels "surprisingly good."   Scientific laboratory technician, [employer]

## 2013-01-29 ENCOUNTER — Inpatient Hospital Stay (HOSPITAL_COMMUNITY): Payer: Medicare Other

## 2013-01-29 LAB — BASIC METABOLIC PANEL
BUN: 19 mg/dL (ref 6–23)
CO2: 27 mEq/L (ref 19–32)
Calcium: 9.3 mg/dL (ref 8.4–10.5)
Creatinine, Ser: 0.79 mg/dL (ref 0.50–1.10)
GFR calc Af Amer: >90 mL/min (ref >90)
Sodium: 129 mEq/L — ABNORMAL LOW (ref 135–145)

## 2013-01-29 MED ORDER — PANTOPRAZOLE SODIUM 40 MG IV SOLR
40.0000 mg | Freq: Once | INTRAVENOUS | Status: AC
  Administered 2013-01-29: 40 mg via INTRAVENOUS
  Filled 2013-01-29 (×2): qty 40

## 2013-01-29 MED ORDER — PREDNISONE 20 MG PO TABS
20.0000 mg | ORAL_TABLET | Freq: Every day | ORAL | Status: DC
  Administered 2013-01-30: 20 mg via ORAL
  Filled 2013-01-29 (×2): qty 1

## 2013-01-29 MED ORDER — ALUM & MAG HYDROXIDE-SIMETH 200-200-20 MG/5ML PO SUSP
30.0000 mL | Freq: Once | ORAL | Status: AC
  Administered 2013-01-29: 30 mL via ORAL
  Filled 2013-01-29 (×2): qty 30

## 2013-01-29 MED ORDER — ALBUTEROL SULFATE (5 MG/ML) 0.5% IN NEBU: 2.5000 mg | INHALATION_SOLUTION | RESPIRATORY_TRACT | Status: DC | PRN

## 2013-01-29 NOTE — Progress Notes (Signed)
Spoke to Dr. Sung Amabile again - new orders to unclamp chest tube after chest xray is complete.  [name], Jamaurion Slemmer [place]

## 2013-01-29 NOTE — Progress Notes (Signed)
PCXR completed at 1702. Chest tube unclamped at 1708. At 1716 pt denied feeling better or worse after having chest tube unclamped. Dr. Sung Amabile notified of new results. No change in chest tube per PCXR. New orders rec'd for protonix and maalox for abdominal gas/distention. Stated it was ok to leave chest tube unclamped for now. Will monitor.  [name], Azel [facility] [place]

## 2013-01-29 NOTE — Progress Notes (Signed)
PULMONARY  / CRITICAL CARE MEDICINE  Name: Shawna [name] MRN: [id number] DOB: 03-Sep-1945    ADMISSION DATE:  01/26/2013  CHIEF COMPLAINT:  Shortness of breath   Brief Summary:  67 year old ex smoker with COPD - gold B, RUL lobectomy in April 2013 for benign lesion (hendrickson) adm 11/10 for recurrent left pneumothorax after left transbronchial biopsy for PET positive nodule & LL atelectasis.   She presented with PET positive Lt lower lung nodule increasing in size s/p bronchoscopy 11/4 and developed Lt PTX after procedure. Therapeutic interventions included: Chest tube placement on 11/5   and serial CXRs. PTX resolved. Pt transitioned to water seal, then CT was removed on 11/7 F/U CXR on day of d/c 11/8 with out ptx. In addition to PTX treatment: the pathology results from the bronchoscopy were reported as negative for malignancy but did show atypical cells. 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 And she was started on rocephin & pred taper for COPD flare.   Only additional concern of note is of on-going hyponatremia. Her urine lytes, cortisol, and Tsh were evaluated. We felt that this was likely due to her HCTZ which was held at time of d/cLeft ovary could not be seen on pelvic US to clarify PET positive.   On Nov 9th, the patient developed increasingly severe SOB which would worsen with bending at the waist. She was unable to sleep overnight - CXR in ED showed larger left pnthx & Shawna [name] chest tube was emergently inserted   SUBJECTIVE:  No new complaints. No distress  VITAL SIGNS: Temp:  [97.5 F (36.4 C)-98.2 F (36.8 C)] 97.7 F (36.5 C) (11/13 1320) Pulse Rate:  [74-96] 87 (11/13 1320) Resp:  [18] 18 (11/13 1320) BP: (118-164)/(78-108) 125/78 mmHg (11/13 1320) SpO2:  [93 %-98 %] 93 % (11/13 1320) Weight:  [54.2 kg (119 lb 7.8 oz)] 54.2 kg (119 lb 7.8 oz) (11/12 2028)  PHYSICAL EXAMINATION: Gen. NAD Lungs: Clear, no air leak on chest tube Cardiovascular: RRR s  M Abdomen: soft, NT, NABS Ext: No C/C/E    I have reviewed all of today's lab results. Relevant abnormalities are discussed in the A/P section   CXR: NSC minimal L apical PTX  ASSESSMENT / PLAN:  A:  Acute resp failure, resolved Recurrent L PTX after bronchoscopy P:  Chest tube clamped Recheck CXR this evening and consider chest tube removal PA/lat CXR AM. Consider D/C home if no worsening of ptx  A:  Lt lower lung nodule >> atypical cells on BA, brushing >> surgical path negative for malignancy.  P:  -will need FU imaging  A: LLL PNA - moraxella on BAL .  AECOPD.  P:  Cont nebs and abx  A:  Mild hyponatremia P:  D/C HCTZ  A: Hypertension  P:  Verapamil   A:  PET positive Lt ovarian mass.  P:  -may need CT pelvis at some point   Billy Fischer, MD ; [facility] [phone].  After 5:30 PM or weekends, call [phone]   ADD: after clamping of chest tube, RN reports increased L sided chest discomfort. STAT CXR ordered   Billy Fischer, MD ; [facility] [phone].  After 5:30 PM or weekends, call [phone]

## 2013-01-29 NOTE — Progress Notes (Signed)
Dr. Sung Amabile clamped pt's chest tube at approx 1240 this afternoon. At 1455 pt stated she felt like she was having "a harder time taking a breath" and felt "more discomfort" than she's felt recently, but denied actually feeling short of breath. Pt was scheduled to have a PCXR at 1700 this evening. Dr. Sung Amabile notified and changed the [facility] to be done stat. Pt currently stable. Will continue to monitor.  [name], Reuben Knoblock [place]

## 2013-01-30 ENCOUNTER — Inpatient Hospital Stay (HOSPITAL_COMMUNITY): Payer: Medicare Other

## 2013-01-30 MED ORDER — AMOXICILLIN-POT CLAVULANATE 875-125 MG PO TABS: 1.0000 | ORAL_TABLET | Freq: Two times a day (BID) | ORAL | Status: AC

## 2013-01-30 MED ORDER — PREDNISONE 10 MG PO TABS: ORAL_TABLET | ORAL | Status: DC

## 2013-01-30 MED ORDER — VERAPAMIL HCL ER 240 MG PO TBCR: 240.0000 mg | EXTENDED_RELEASE_TABLET | Freq: Every day | ORAL | Status: DC

## 2013-01-30 NOTE — Progress Notes (Signed)
Patient discharged to home. Patient AVS reviewed. Patient verbalized understanding of medications and follow-up appointments.  Patient remains stable; no signs or symptoms of distress.  Patient educated to return to the ER in cases of SOB, dizziness, fever, chest pain, or fainting.  

## 2013-01-30 NOTE — Discharge Summary (Signed)
Physician Discharge Summary     Patient ID: Shawna [name] MRN: [id number] DOB/AGE: April 27, 1945 67 y.o.  Admit date: 01/26/2013 Discharge date: 01/30/2013  Discharge Diagnoses:  Principal Problem:   Pneumothorax of left lung after biopsy Active Problems:   Acute-on-chronic respiratory failure   Pneumonia   Detailed Hospital Course:  67 year old ex smoker with COPD - gold B, RUL lobectomy in April 2013 for benign lesion (hendrickson) adm 11/10 for recurrent left pneumothorax after left transbronchial biopsy for PET positive nodule & LL atelectasis. She presented with PET positive Lt lower lung nodule increasing in size s/p bronchoscopy 11/4 and developed Lt PTX after procedure. Therapeutic interventions included: Chest tube placement on 11/5 and serial CXRs. PTX resolved. Pt transitioned to water seal, then CT was removed on 11/7 F/U CXR on day of d/c 11/8 with out ptx. In addition to PTX treatment: the pathology results from the bronchoscopy were reported as negative for malignancy but did show atypical cells. 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 And she was started on rocephin & pred taper for COPD flare. Only additional concern of note is of on-going hyponatremia. Her urine lytes, cortisol, and Tsh were evaluated. We felt that this was likely due to her HCTZ which was held at time of d/cLeft ovary could not be seen on pelvic US to clarify PET positive.  On Nov 9th, the patient developed increasingly severe SOB which would worsen with bending at the waist. She was unable to sleep overnight - CXR in ED showed larger left pnthx & wayne chest tube was emergently inserted.  She was again admitted to the hospital. Initially the CT was placed on suction, again transitioned to water seal AND also clamped for several hours on 11/13. Her CXR cleared, it remained free of PTX on 11/13 and 11/14 and so her CT was again removed. She had her f/u CXR obtained the afternoon and w/ her CXR clear she was  deemed ready for d/c.     Discharge Plan by diagnoses  Acute resp failure, resolved  Recurrent L PTX after bronchoscopy  (resolved) P:  F/u Monday 1030 for check in and cxr, then 1115 w/ TP  Instructed on no lifting and keeping occlusive dressing on for 24hrs  Lt lower lung nodule >> atypical cells on BA, brushing >> surgical path negative for malignancy.  P:  -will need FU imaging   LLL PNA - moraxella on BAL .  AECOPD.  P:  Cont nebs  3 more days of augmentin  Complete pred taper   A:  Mild hyponatremia  P:  D/C HCTZ  A: Hypertension  P:  Verapamil  A:  PET positive Lt ovarian mass.  P:  -may need CT pelvis at some point     Significant Hospital tests/ studies/ interventions and procedures  Consults  Discharge Exam: BP 121/76  Pulse 86  Temp(Src) 98.6 F (37 C) (Oral)  Resp 20  Ht 5' 6.5" (1.689 m)  Wt 53.887 kg (118 lb 12.8 oz)  BMI 18.89 kg/m2  SpO2 97%  Gen. NAD  Lungs: Clear, no air leak on chest tube prior to removal, now has occlusive dressing  Cardiovascular: RRR s M  Abdomen: soft, NT, NABS  Ext: No C/C/E   Labs at discharge Lab Results  Component Value Date   CREATININE 0.79 01/29/2013   BUN 19 01/29/2013   NA 129* 01/29/2013   K 4.5 01/29/2013   CL 92* 01/29/2013   CO2 27 01/29/2013  Lab Results  Component Value Date   WBC 12.7* 01/28/2013   HGB 13.5 01/28/2013   HCT 38.5 01/28/2013   MCV 85.7 01/28/2013   PLT 225 01/28/2013   Lab Results  Component Value Date   ALT 14 01/26/2013   AST 17 01/26/2013   ALKPHOS 86 01/26/2013   BILITOT 0.3 01/26/2013   Lab Results  Component Value Date   INR 1.05 07/04/2011   INR 1.0 10/01/2006    Current radiology studies Dg Chest 2 View  01/30/2013   CLINICAL DATA:  Left apical pneumothorax.  EXAM: CHEST  2 VIEW  COMPARISON:  January 29, 2013.  FINDINGS: Left-sided chest tube is unchanged in position. However, on the lateral radiograph it appears to be kinked. The minimal left  apical pneumothorax noted on prior exam is not definitively visualized on this study. Scarring of the right hemidiaphragm is again noted laterally. Surgical clips are noted in the right hilar region. Cardiac silhouette appears normal.  IMPRESSION: Minimal left apical pneumothorax noted on prior exam is not clearly visualized on this study. Left-sided chest tube is again noted, although it appears to be kinked on the lateral projection.   Electronically Signed   By: Roque Lias M.D.   On: 01/30/2013 07:48   Dg Chest Port 1 View  01/29/2013   CLINICAL DATA:  Shortness of breath.  Followup of pneumonia.  COPD.  EXAM: PORTABLE CHEST - 1 VIEW  COMPARISON:  01/28/2013  FINDINGS: A small bore left-sided chest tube remains in place. Underline hyperinflation is again identified. Surgical changes in the low left neck. Normal heart size with atherosclerosis in the transverse aorta. A tiny left apical pneumothorax remains, approximately 5%. Small left pleural fluid component as well. Right hemidiaphragm elevation and lateral tenting is not significantly changed.  Mild scarring/volume loss at the right lung base. Similar left base atelectasis.  IMPRESSION: No significant change in small left hydropneumothorax. The apical pleural air component measures approximately 5%.  Left base volume loss/atelectasis.  Aortic atherosclerosis.   Electronically Signed   By: Jeronimo Greaves M.D.   On: 01/29/2013 07:48   Dg Chest Port 1v Same Day  01/29/2013   CLINICAL DATA:  Pneumothorax.  EXAM: PORTABLE CHEST - 1 VIEW SAME DAY  COMPARISON:  01/29/2013.  FINDINGS: Left chest tube is again noted and in stable position. Miniscule left apical pneumothorax unchanged. Mediastinum is normal. Surgical clips right hilar region. Tenting of the right hemidiaphragm again noted consistent with pleural parenchymal scarring. No focal infiltrate. Findings consistent with COPD. Heart size normal. Pulmonary vascularity normal. Gastric distention cannot be  excluded. KUB and upright abdomen may prove useful for further evaluation. Surgical clips right chest wall. No acute bony abnormality.  IMPRESSION: 1. Stable left chest tube positioning. Miniscule left apical pneumothorax remains unchanged. 2. Gastric distention cannot be excluded. KUB and upright abdomen may prove useful for further evaluation. 3. Postsurgical changes right chest. Pleural parenchymal scarring on the right present.   Electronically Signed   By: Maisie Fus  Register   On: 01/29/2013 17:02    Disposition:  01-Home or Self Care      Discharge Orders   Future Appointments Provider Department Dept Phone   02/02/2013 11:30 AM Julio Sicks, NP [facility] [phone]   Future Orders Complete By Expires   Diet - low sodium heart healthy  As directed    Discharge instructions  As directed    Comments:     No lifting more than 10 lbs May  shower 24 hours   Increase activity slowly  As directed        Medication List    STOP taking these medications       verapamil 180 MG (CO) 24 hr tablet  Commonly known as:  COVERA HS  Replaced by:  verapamil 240 MG CR tablet      TAKE these medications       acetaminophen 500 MG tablet  Commonly known as:  TYLENOL  Take 500 mg by mouth every 6 (six) hours as needed for moderate pain.     albuterol 108 (90 BASE) MCG/ACT inhaler  Commonly known as:  PROVENTIL HFA;VENTOLIN HFA  Inhale 2 puffs into the lungs every 4 (four) hours as needed. For shortness of breath     albuterol (2.5 MG/3ML) 0.083% nebulizer solution  Commonly known as:  PROVENTIL  Take 2.5 mg by nebulization 4 (four) times daily. For shortness of breath     ALPRAZolam 0.25 MG tablet  Commonly known as:  XANAX  Take 0.25 mg by mouth at bedtime as needed for sleep.     amoxicillin-clavulanate 875-125 MG per tablet  Commonly known as:  AUGMENTIN  Take 1 tablet by mouth 2 (two) times daily.     cyanocobalamin 1000 MCG/ML injection  Commonly known as:   (VITAMIN B-12)  Inject 1,000 mcg into the muscle every 30 (thirty) days. Given on the 10th of each month     guaiFENesin 600 MG 12 hr tablet  Commonly known as:  MUCINEX  Take 600 mg by mouth 2 (two) times daily.     KLOR-CON M20 20 MEQ tablet  Generic drug:  potassium chloride SA  Take 20 mEq by mouth daily.     loratadine 10 MG tablet  Commonly known as:  CLARITIN  Take 1 tablet (10 mg total) by mouth daily.     meloxicam 15 MG tablet  Commonly known as:  MOBIC  Take 15 mg by mouth daily.     predniSONE 10 MG tablet  Commonly known as:  DELTASONE  Take  2 tabs daily for  2d, then 1 tab daily for 2d and stop     tiotropium 18 MCG inhalation capsule  Commonly known as:  SPIRIVA HANDIHALER  Place 1 capsule (18 mcg total) into inhaler and inhale daily.     verapamil 240 MG CR tablet  Commonly known as:  CALAN-SR  Take 1 tablet (240 mg total) by mouth at bedtime.       Follow-up Information   Follow up with PARRETT,TAMMY, NP On 02/02/2013. (report for check in at 1030, then appointment is at 1115 with Rubye Oaks. )    Specialty:  Nurse Practitioner   Contact information:   [address] [place] [address] [phone]       Discharged Condition: good  Physician Statement:   The Patient was personally examined, the discharge assessment and plan has been personally reviewed and I agree with ACNP Babcock's assessment and plan. > 30 minutes of time have been dedicated to discharge assessment, planning and discharge instructions.   Signed: BABCOCK,PETE 01/30/2013, 2:05 PM    PCCM ATTENDING: CXR after chest tube removal shows no change in minimal L apical ptx. OK for discharge to home   Billy Fischer, MD ; [facility] [phone].  After 5:30 PM or weekends, call [phone]

## 2013-02-01 LAB — CULTURE, BLOOD (ROUTINE X 2)

## 2013-02-01 LAB — CULTURE, BLOOD (SINGLE): Culture: NO GROWTH

## 2013-02-02 ENCOUNTER — Encounter: Payer: Self-pay | Admitting: Adult Health

## 2013-02-02 ENCOUNTER — Ambulatory Visit (INDEPENDENT_AMBULATORY_CARE_PROVIDER_SITE_OTHER): Payer: Medicare Other | Admitting: Adult Health

## 2013-02-02 ENCOUNTER — Ambulatory Visit (INDEPENDENT_AMBULATORY_CARE_PROVIDER_SITE_OTHER)
Admission: RE | Admit: 2013-02-02 | Discharge: 2013-02-02 | Disposition: A | Discharge status: Home / Self Care | Payer: Medicare Other | Source: Ambulatory Visit | Attending: Adult Health | Admitting: Adult Health

## 2013-02-02 VITALS — BP 124/76 | HR 80 | Temp 96.8°F | Ht 66.5 in | Wt 120.0 lb

## 2013-02-02 DIAGNOSIS — R911 Solitary pulmonary nodule: Secondary | ICD-10-CM

## 2013-02-02 DIAGNOSIS — J449 Chronic obstructive pulmonary disease, unspecified: Secondary | ICD-10-CM

## 2013-02-02 DIAGNOSIS — N839 Noninflammatory disorder of ovary, fallopian tube and broad ligament, unspecified: Secondary | ICD-10-CM

## 2013-02-02 DIAGNOSIS — N838 Other noninflammatory disorders of ovary, fallopian tube and broad ligament: Secondary | ICD-10-CM

## 2013-02-02 DIAGNOSIS — J95811 Postprocedural pneumothorax: Secondary | ICD-10-CM

## 2013-02-02 NOTE — Patient Instructions (Addendum)
Refer to GYN for evaluation of possible Left Ovarian Mass.  Finish Augmentin .  Continue on Oxygen 2l/m with walking and At bedtime   Continue on Spiriva daily  Plan to repeat CT chest in 3 months to follow Left lung nodule .  follow up Dr. Vassie Loll  In 4 weeks and As needed   Please contact office for sooner follow up if symptoms do not improve or worsen or seek emergency care

## 2013-02-03 NOTE — Assessment & Plan Note (Signed)
Resolved on cxr  

## 2013-02-03 NOTE — Progress Notes (Signed)
Subjective:    Patient ID: Shawna [name], female    DOB: 11/06/1945, 67 y.o.   MRN: [id number]  HPI   67 year old ex smoker with COPD -gold B,  RUL lobectomy in April 2013 for benign lesion (hendrickson).  Admitted 02/02/12 for AECOPD and CAP.  She has been on home O2 since 6/13 (by PCP)  Quit smoking 3/13 but relapsed in 2014  ABGs showed compensated hypercarbia.  3/ 2013 PFTs - DLCO 50%, FEV1 1.68 - 64% (pre-op)    Adm 9/19- 12/09/12 for COPD flare  CT chest 12/2012  -Mildly increased size of the chronic left lung nodule, now 8 x 10 mm. This has been slowly increasing since 05/10/2011  Did not get started with pulm rehab since not available at [facility]  ON last visit , she felt Advair & spiriva are very expensive >> substituted budesonide/ brovana/ atrovent, but seems like she is back on advair/spiriva  C/o hoarseness Advair changed to symbicort for hoarseness.  01/15/13  Accompanied by daughter  Chief Complaint  Patient presents with  . Follow-up    HFU - reports breathing is doing well.  no new complaints but would like to discuss whether she needs restart Advair.   Breathing appears to be worse, feels like chest congestion, but is unable to expectorate. PET scan showed Interval left lower lobe consolidation/collapse. Associated hypermetabolism. The 10 mm left lower lobe nodule noted on prior CTs was not discretely visualized.Associated debris within the left lower lobe bronchus  Two adjacent hypermetabolic foci were present without corresponding CT abnormality. Focal hypermetabolism within the left ovary  02/02/13 Post Hospital follow up  Readmitted 01/26/13 for recurrent left pneumothorax after left transbronchial biopsy for PET positive nodule & LL atelectasis. She presented with PET positive Lt lower lung nodule increasing in size s/p bronchoscopy 11/4 and developed Lt PTX after procedure. Therapeutic interventions included: Chest tube placement on 11/5 and serial CXRs. PTX  resolved. Pt transitioned to water seal, then CT was removed on 11/7  And discharged on 11/8.  Pathology results from the bronchoscopy were reported as negative for malignancy but did show atypical cells. 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 And she was started on rocephin & pred taper for COPD flare.  Only additional concern of note is of on-going hyponatremia. Her urine lytes, cortisol, and Tsh were evaluated.   felt that this was likely due to her HCTZ and was stopped.  PET did have positive area on left ovary. Left ovary could not be seen on pelvic US to clarify PET positive.  On Nov 9th, the patient developed increasingly severe SOB which would worsen with bending at the waist. She was unable to sleep overnight - CXR in ED showed larger left pnthx & wayne chest tube was emergently inserted.  She was again admitted to the hospital. Initially the CT was placed on suction, again transitioned to water seal AND also clamped for several hours on 11/13. Her CXR cleared, it remained free of PTX on 11/13 and 11/14 and so her CT was again removed.  CXR w/out PTX .   Since discharge she is feeling better. No chest pain or increased cough . No fever.  Has few days left on abx. -Levaquin  CXR today shows no PTX. No acute infiltrate noted.  No hemotysis, n/v/d.       Past Medical History  Diagnosis Date  . COPD (chronic obstructive pulmonary disease)   . HTN (hypertension)   . B12 deficiency   .  OA (osteoarthritis)   . Hypokalemia   . Mass of lung   . GERD (gastroesophageal reflux disease)   . Diverticulosis of colon (without mention of hemorrhage)   . 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 Pulmonary nodule       Review of Systems  neg for any significant sore throat, dysphagia, itching, sneezing, nasal congestion or excess/ purulent secretions, fever, chills, sweats, unintended wt loss, pleuritic or exertional cp, hempoptysis, orthopnea pnd or change in chronic leg  swelling. Also denies presyncope, palpitations, heartburn, abdominal pain, nausea, vomiting, diarrhea or change in bowel or urinary habits, dysuria,hematuria, rash, arthralgias, visual complaints, headache, numbness weakness or ataxia.     Objective:   Physical Exam   Gen. Pleasant, well-nourished, in no distress, normal affect ENT - no lesions, no post nasal drip Neck: No JVD, no thyromegaly, no carotid bruits Lungs: no use of accessory muscles, no dullness to percussion, decreased BS  without rales or rhonchi  Chest tube site, healed, no significant redness, small stitch removed without difficulty  Cardiovascular: Rhythm regular, heart sounds  normal, no murmurs or gallops, no peripheral edema Abdomen: soft and non-tender, no hepatosplenomegaly, BS normal. Musculoskeletal: No deformities, no cyanosis or clubbing Neuro:  alert, non focal       Assessment & Plan:

## 2013-02-03 NOTE — Assessment & Plan Note (Signed)
Flare now resolved   Plan  Finish Augmentin .  Continue on Oxygen 2l/m with walking and At bedtime   Continue on Spiriva daily  follow up Dr. Vassie Loll  In 4 weeks and As needed   Please contact office for sooner follow up if symptoms do not improve or worsen or seek emergency care

## 2013-02-03 NOTE — Assessment & Plan Note (Signed)
PET positive for possible ovarian mass  Pelvic US did not visualize ovary well  Refer to GYN for evaluation

## 2013-02-03 NOTE — Assessment & Plan Note (Signed)
Will repeat CT chest in 3 months to follow Left lung nodule .  Follow up Dr. Vassie Loll  In 4 weeks and As needed   Please contact office for sooner follow up if symptoms do not improve or worsen or seek emergency care

## 2013-02-09 ENCOUNTER — Telehealth: Payer: Self-pay | Admitting: Pulmonary Disease

## 2013-02-09 NOTE — Telephone Encounter (Signed)
Would try to use Mucinex Twice daily  As needed   Saline nasal rinses As needed   As she was just on abx .  Please contact office for sooner follow up if symptoms do not improve or worsen or seek emergency care   ?did she get ov with GYN for ? Ovarian mass.

## 2013-02-09 NOTE — Telephone Encounter (Signed)
I called and spoke with pt. She c/o sneezing, dry cough, left side facial pain, chest congestion, runny nose. Denies any wheezing, no chest tx, no increase SOB, no f/c/s/n/v. She is requesting something she can take that won't interfere with her current medications. Please advise TP thanks  Allergies  Allergen Reactions  . Aspirin Swelling    Lips and face swelled.  Warden Fillers [Oxycodone-Aspirin] Other (See Comments)    "went out of my head"

## 2013-02-09 NOTE — Telephone Encounter (Signed)
Pt advised. Shawna [name], CMA  

## 2013-02-13 ENCOUNTER — Telehealth: Payer: Self-pay | Admitting: Pulmonary Disease

## 2013-02-13 ENCOUNTER — Ambulatory Visit (INDEPENDENT_AMBULATORY_CARE_PROVIDER_SITE_OTHER)
Admission: RE | Admit: 2013-02-13 | Discharge: 2013-02-13 | Disposition: A | Discharge status: Home / Self Care | Payer: Medicare Other | Source: Ambulatory Visit | Attending: Critical Care Medicine | Admitting: Critical Care Medicine

## 2013-02-13 ENCOUNTER — Encounter: Payer: Self-pay | Admitting: Critical Care Medicine

## 2013-02-13 ENCOUNTER — Ambulatory Visit (INDEPENDENT_AMBULATORY_CARE_PROVIDER_SITE_OTHER): Payer: Medicare Other | Admitting: Critical Care Medicine

## 2013-02-13 VITALS — BP 120/82 | HR 96 | Temp 97.8°F | Ht 66.5 in | Wt 120.6 lb

## 2013-02-13 DIAGNOSIS — J441 Chronic obstructive pulmonary disease with (acute) exacerbation: Secondary | ICD-10-CM

## 2013-02-13 DIAGNOSIS — J95811 Postprocedural pneumothorax: Secondary | ICD-10-CM

## 2013-02-13 DIAGNOSIS — J019 Acute sinusitis, unspecified: Secondary | ICD-10-CM

## 2013-02-13 DIAGNOSIS — J9610 Chronic respiratory failure, unspecified whether with hypoxia or hypercapnia: Secondary | ICD-10-CM | POA: Insufficient documentation

## 2013-02-13 MED ORDER — AMOXICILLIN-POT CLAVULANATE 875-125 MG PO TABS: 1.0000 | ORAL_TABLET | Freq: Two times a day (BID) | ORAL | Status: DC

## 2013-02-13 MED ORDER — FLUTICASONE PROPIONATE 50 MCG/ACT NA SUSP: 2.0000 | Freq: Every day | NASAL | Status: DC

## 2013-02-13 MED ORDER — PREDNISONE 10 MG PO TABS: ORAL_TABLET | ORAL | Status: DC

## 2013-02-13 NOTE — Telephone Encounter (Signed)
I called and spoke with pt. She is scheduled to come in and see PW this AM at 9:45. Nothing further needed

## 2013-02-13 NOTE — Patient Instructions (Addendum)
Prednisone 10mg  Take 4 for two days three for two days two for two days one for two days Augmentin one twice daily for 7days Use Lloyd Huger med sinus rinse twice daily for 7days Flonase two puff ea nostril daily Resume advair twice daily in addition to other inhalers Keep 02/24/13 appt with tammy parrett

## 2013-02-13 NOTE — Assessment & Plan Note (Signed)
Left upper lobe pneumothorax which has resolved on current chest x-ray No additional studies indicated

## 2013-02-13 NOTE — Progress Notes (Signed)
Subjective:    Patient ID: Shawna [name], female    DOB: 23-Aug-1945, 67 y.o.   MRN: [id number]  HPI   67 year old ex smoker with COPD -gold B,  RUL lobectomy in April 2013 for benign lesion (hendrickson).  Admitted 02/02/12 for AECOPD and CAP.  She has been on home O2 since 6/13 (by PCP)  Quit smoking 3/13 but relapsed in 2014  ABGs showed compensated hypercarbia.  3/ 2013 PFTs - DLCO 50%, FEV1 1.68 - 64% (pre-op)    Adm 9/19- 12/09/12 for COPD flare  CT chest 12/2012  -Mildly increased size of the chronic left lung nodule, now 8 x 10 mm. This has been slowly increasing since 05/10/2011  Did not get started with pulm rehab since not available at [facility]  ON last visit , she felt Advair & spiriva are very expensive >> substituted budesonide/ brovana/ atrovent, but seems like she is back on advair/spiriva  C/o hoarseness Advair changed to symbicort for hoarseness.  01/15/13  Accompanied by daughter Breathing appears to be worse, feels like chest congestion, but is unable to expectorate. PET scan showed Interval left lower lobe consolidation/collapse. Associated hypermetabolism. The 10 mm left lower lobe nodule noted on prior CTs was not discretely visualized.Associated debris within the left lower lobe bronchus  Two adjacent hypermetabolic foci were present without corresponding CT abnormality. Focal hypermetabolism within the left ovary  02/02/13 Post Hospital follow up  Readmitted 01/26/13 for recurrent left pneumothorax after left transbronchial biopsy for PET positive nodule & LL atelectasis. She presented with PET positive Lt lower lung nodule increasing in size s/p bronchoscopy 11/4 and developed Lt PTX after procedure. Therapeutic interventions included: Chest tube placement on 11/5 and serial CXRs. PTX resolved. Pt transitioned to water seal, then CT was removed on 11/7  And discharged on 11/8.  Pathology results from the bronchoscopy were reported as negative for malignancy but did  show atypical cells. 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 And she was started on rocephin & pred taper for COPD flare.  Only additional concern of note is of on-going hyponatremia. Her urine lytes, cortisol, and Tsh were evaluated.   felt that this was likely due to her HCTZ and was stopped.  PET did have positive area on left ovary. Left ovary could not be seen on pelvic US to clarify PET positive.  On Nov 9th, the patient developed increasingly severe SOB which would worsen with bending at the waist. She was unable to sleep overnight - CXR in ED showed larger left pnthx & wayne chest tube was emergently inserted.  She was again admitted to the hospital. Initially the CT was placed on suction, again transitioned to water seal AND also clamped for several hours on 11/13. Her CXR cleared, it remained free of PTX on 11/13 and 11/14 and so her CT was again removed.  CXR w/out PTX .   Since discharge she is feeling better. No chest pain or increased cough . No fever.  Has few days left on abx. -Levaquin  CXR today shows no PTX. No acute infiltrate noted.  No hemotysis, n/v/d.    02/13/2013 Chief Complaint  Patient presents with  . Acute Visit    Pt c/o having increased SOB with minimal activity, increased chest congestion and dry cough.  Pt also c/o chest tightness all x 3 days.  Pt had Advair 500/50 on hand so she took a dose last night and this morning and feels it helped some. Pt denies  any chest pain, fever, body aches/chills.    Pt notes more dyspnea, noted more sneezing and coughing and runny nose.  Pt then noted more dyspnea over the week. Cough is dry. Feels loose in chest. No real chest pain  No f/c/s.  Had been holding advair d/t voice issues. Pt in hosp 3 times since 11/2012    Past Medical History  Diagnosis Date  . COPD (chronic obstructive pulmonary disease)   . HTN (hypertension)   . B12 deficiency   . OA (osteoarthritis)   . Hypokalemia   . Mass of lung   . GERD  (gastroesophageal reflux disease)   . Diverticulosis of colon (without mention of hemorrhage)   . 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 Pulmonary nodule       Review of Systems  Neg (except in BOLD) for any significant sore throat, dysphagia, itching, sneezing, nasal congestion or excess/ purulent secretions, fever, chills, sweats, unintended wt loss, pleuritic or exertional cp, hempoptysis, orthopnea pnd or change in chronic leg swelling. Also denies presyncope, palpitations, heartburn, abdominal pain, nausea, vomiting, diarrhea or change in bowel or urinary habits, dysuria,hematuria, rash, arthralgias, visual complaints, headache, numbness weakness or ataxia.     Objective:   Physical Exam  Filed Vitals:   02/13/13 0954  BP: 120/82  Pulse: 96  Temp: 97.8 F (36.6 C)  TempSrc: Oral  Height: 5' 6.5" (1.689 m)  Weight: 120 lb 9.6 oz (54.704 kg)  SpO2: 93%    Gen: Pleasant, well-nourished, in no distress,  normal affect  ENT: No lesions,  mouth clear,  oropharynx clear, moderate postnasal drip, bilateral nasal purulence  Neck: No JVD, no TMG, no carotid bruits  Lungs: No use of accessory muscles, no dullness to percussion, distant breath Cardiovascular: RRR, heart sounds normal, no murmur or gallops, no peripheral edema  Abdomen: soft and NT, no HSM,  BS normal  Musculoskeletal: No deformities, no cyanosis or clubbing  Neuro: alert, non focal  Skin: Warm, no lesions or rashes  Dg Chest 2 View  02/13/2013   CLINICAL DATA:  Shortness of breath.  EXAM: CHEST  2 VIEW  COMPARISON:  02/02/2013.  01/30/2013.  FINDINGS: No definite residual pneumothorax on the left noted. Stable tenting of the right hemidiaphragm with pleural thickening noted. These findings are consistent with scarring. Surgical clips are noted right hilum. Changes of COPD. Mediastinum is normal. Cardiac structures and pulmonary vascularity normal. Cervical spine fusion. Surgical clip projected  over right chest wall.  IMPRESSION: 1. No evidence of pneumothorax. 2. Postsurgical changes right lung.  Right pleural scarring.   Electronically Signed   By: Maisie Fus  Register   On: 02/13/2013 10:25        Assessment & Plan:   Pneumothorax of left lung after biopsy Left upper lobe pneumothorax which has resolved on current chest x-ray No additional studies indicated  COPD exacerbation Recurrent COPD exacerbation on the basis of acute sinusitis Plan Prednisone 10mg  Take 4 for two days three for two days two for two days one for two days Augmentin one twice daily for 7days Use Lloyd Huger med sinus rinse twice daily for 7days Flonase two puff ea nostril daily Resume advair twice daily in addition to other inhalers Keep 02/24/13 appt with tammy parrett   Acute sinusitis Recurrent acute sinusitis Plan Review COPD exacerbation assessment  Chronic respiratory failure Chronic respiratory failure Plan Maintain oxygen therapy as prescribed   Updated Medication List Outpatient Encounter Prescriptions as of 02/13/2013  Medication Sig  .  acetaminophen (TYLENOL) 500 MG tablet Take 500 mg by mouth every 6 (six) hours as needed for moderate pain.   Marland Kitchen albuterol (PROVENTIL HFA;VENTOLIN HFA) 108 (90 BASE) MCG/ACT inhaler Inhale 2 puffs into the lungs every 4 (four) hours as needed. For shortness of breath  . albuterol (PROVENTIL) (2.5 MG/3ML) 0.083% nebulizer solution Take 2.5 mg by nebulization 4 (four) times daily. For shortness of breath  . ALPRAZolam (XANAX) 0.25 MG tablet Take 0.25 mg by mouth at bedtime as needed for sleep.   . cyanocobalamin (,VITAMIN B-12,) 1000 MCG/ML injection Inject 1,000 mcg into the muscle every 30 (thirty) days. Given on the 10th of each month  . Fluticasone-Salmeterol (ADVAIR DISKUS) 500-50 MCG/DOSE AEPB Inhale 1 puff into the lungs 2 (two) times daily.  Marland Kitchen guaiFENesin (MUCINEX) 600 MG 12 hr tablet Take 600 mg by mouth 2 (two) times daily.   Marland Kitchen KLOR-CON M20 20 MEQ  tablet Take 20 mEq by mouth daily.   Marland Kitchen loratadine (CLARITIN) 10 MG tablet Take 1 tablet (10 mg total) by mouth daily.  . meloxicam (MOBIC) 15 MG tablet Take 15 mg by mouth daily.   Marland Kitchen tiotropium (SPIRIVA HANDIHALER) 18 MCG inhalation capsule Place 1 capsule (18 mcg total) into inhaler and inhale daily.  . verapamil (CALAN-SR) 240 MG CR tablet Take 1 tablet (240 mg total) by mouth at bedtime.  Marland Kitchen amoxicillin-clavulanate (AUGMENTIN) 875-125 MG per tablet Take 1 tablet by mouth 2 (two) times daily.  . fluticasone (FLONASE) 50 MCG/ACT nasal spray Place 2 sprays into both nostrils daily.  . predniSONE (DELTASONE) 10 MG tablet Take 4 for two days three for two days two for two days one for two days

## 2013-02-13 NOTE — Assessment & Plan Note (Signed)
Chronic respiratory failure Plan Maintain oxygen therapy as prescribed

## 2013-02-13 NOTE — Assessment & Plan Note (Signed)
Recurrent COPD exacerbation on the basis of acute sinusitis Plan Prednisone 10mg  Take 4 for two days three for two days two for two days one for two days Augmentin one twice daily for 7days Use Lloyd Huger med sinus rinse twice daily for 7days Flonase two puff ea nostril daily Resume advair twice daily in addition to other inhalers Keep 02/24/13 appt with tammy parrett

## 2013-02-13 NOTE — Assessment & Plan Note (Signed)
Recurrent acute sinusitis Plan Review COPD exacerbation assessment

## 2013-02-16 LAB — FUNGUS CULTURE W SMEAR: Fungal Smear: NONE SEEN

## 2013-02-24 ENCOUNTER — Encounter: Payer: Self-pay | Admitting: Adult Health

## 2013-02-24 ENCOUNTER — Telehealth: Payer: Self-pay | Admitting: Pulmonary Disease

## 2013-02-24 ENCOUNTER — Ambulatory Visit (INDEPENDENT_AMBULATORY_CARE_PROVIDER_SITE_OTHER): Payer: Medicare Other | Admitting: Adult Health

## 2013-02-24 VITALS — BP 132/80 | HR 84 | Temp 97.5°F | Ht 66.5 in | Wt 121.0 lb

## 2013-02-24 DIAGNOSIS — N838 Other noninflammatory disorders of ovary, fallopian tube and broad ligament: Secondary | ICD-10-CM

## 2013-02-24 DIAGNOSIS — N839 Noninflammatory disorder of ovary, fallopian tube and broad ligament, unspecified: Secondary | ICD-10-CM

## 2013-02-24 DIAGNOSIS — J449 Chronic obstructive pulmonary disease, unspecified: Secondary | ICD-10-CM

## 2013-02-24 DIAGNOSIS — R911 Solitary pulmonary nodule: Secondary | ICD-10-CM

## 2013-02-24 MED ORDER — MOMETASONE FURO-FORMOTEROL FUM 200-5 MCG/ACT IN AERO: 2.0000 | INHALATION_SPRAY | Freq: Two times a day (BID) | RESPIRATORY_TRACT | Status: DC

## 2013-02-24 NOTE — Addendum Note (Signed)
Addended by: Boone Master E on: 02/24/2013 03:20 PM   Modules accepted: Orders

## 2013-02-24 NOTE — Progress Notes (Signed)
Subjective:    Patient ID: Shawna [name], female    DOB: Jun 29, 1945, 67 y.o.   MRN: [id number]  HPI   67 year old ex smoker with COPD -gold B,  RUL lobectomy in April 2013 for benign lesion (hendrickson).  Admitted 02/02/12 for AECOPD and CAP.  She has been on home O2 since 6/13 (by PCP)  Quit smoking 3/13 but relapsed in 2014  ABGs showed compensated hypercarbia.  3/ 2013 PFTs - DLCO 50%, FEV1 1.68 - 64% (pre-op)   Adm 9/19- 12/09/12 for COPD flare  CT chest 12/2012  -Mildly increased size of the chronic left lung nodule, now 8 x 10 mm. This has been slowly increasing since 05/10/2011  Did not get started with pulm rehab since not available at [facility]  ON last visit , she felt Advair & spiriva are very expensive >> substituted budesonide/ brovana/ atrovent, but seems like she is back on advair/spiriva  C/o hoarseness Advair changed to symbicort for hoarseness.  01/15/13  Accompanied by daughter Breathing appears to be worse, feels like chest congestion, but is unable to expectorate. PET scan showed Interval left lower lobe consolidation/collapse. Associated hypermetabolism. The 10 mm left lower lobe nodule noted on prior CTs was not discretely visualized.Associated debris within the left lower lobe bronchus  Two adjacent hypermetabolic foci were present without corresponding CT abnormality. Focal hypermetabolism within the left ovary  02/02/13 Post Hospital follow up  Readmitted 01/26/13 for recurrent left pneumothorax after left transbronchial biopsy for PET positive nodule & LL atelectasis. She presented with PET positive Lt lower lung nodule increasing in size s/p bronchoscopy 11/4 and developed Lt PTX after procedure. Therapeutic interventions included: Chest tube placement on 11/5 and serial CXRs. PTX resolved. Pt transitioned to water seal, then CT was removed on 11/7  And discharged on 11/8.  Pathology results from the bronchoscopy were reported as negative for malignancy but did  show atypical cells. 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 And she was started on rocephin & pred taper for COPD flare.  Only additional concern of note is of on-going hyponatremia. Her urine lytes, cortisol, and Tsh were evaluated.   felt that this was likely due to her HCTZ and was stopped.  PET did have positive area on left ovary. Left ovary could not be seen on pelvic US to clarify PET positive.  On Nov 9th, the patient developed increasingly severe SOB which would worsen with bending at the waist. She was unable to sleep overnight - CXR in ED showed larger left pnthx & wayne chest tube was emergently inserted.  She was again admitted to the hospital. Initially the CT was placed on suction, again transitioned to water seal AND also clamped for several hours on 11/13. Her CXR cleared, it remained free of PTX on 11/13 and 11/14 and so her CT was again removed.  CXR w/out PTX .   Since discharge she is feeling better. No chest pain or increased cough . No fever.  Has few days left on abx. -Levaquin  CXR today shows no PTX. No acute infiltrate noted.  No hemotysis, n/v/d.    02/13/2013 Chief Complaint  Patient presents with  . Acute Visit    Pt c/o having increased SOB with minimal activity, increased chest congestion and dry cough.  Pt also c/o chest tightness all x 3 days.  Pt had Advair 500/50 on hand so she took a dose last night and this morning and feels it helped some. Pt denies any  chest pain, fever, body aches/chills.    Pt notes more dyspnea, noted more sneezing and coughing and runny nose.  Pt then noted more dyspnea over the week. Cough is dry. Feels loose in chest. No real chest pain  No f/c/s.  Had been holding advair d/t voice issues. Pt in hosp 3 times since 11/2012 >>augmentin and steroid taper .   02/24/2013 Follow up  3 week follow up COPD - reports finished augmentin & pred last week and symptoms returned 2 days ago with hoarseness, sneezing, increased SOB, dry cough,  chest congestion, some chest tightness.  has not taken advair x2 days d/t hoarseness. Says that throat gets sore after use and becomes hoarse.  Cough is mainly dry . No fever, discolored mucus , chest pain, orthopnea, edema . cxr last ov with chronic changes , no acute process.  Does have relux and some drainage in throat.  Taking claritin and prilosec .   Has an ov with GYN today to discuss possible ovarian mass. No weight loss.       Past Medical History  Diagnosis Date  . COPD (chronic obstructive pulmonary disease)   . HTN (hypertension)   . B12 deficiency   . OA (osteoarthritis)   . Hypokalemia   . Mass of lung   . GERD (gastroesophageal reflux disease)   . Diverticulosis of colon (without mention of hemorrhage)   . 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 Pulmonary nodule       Review of Systems  Neg (except in BOLD) for any significant sore throat, dysphagia, itching, sneezing, nasal congestion or excess/ purulent secretions, fever, chills, sweats, unintended wt loss, pleuritic or exertional cp, hempoptysis, orthopnea pnd or change in chronic leg swelling. Also denies presyncope, palpitations, heartburn, abdominal pain, nausea, vomiting, diarrhea or change in bowel or urinary habits, dysuria,hematuria, rash, arthralgias, visual complaints, headache, numbness weakness or ataxia.     Objective:   Physical Exam  Filed Vitals:   02/24/13 0920  BP: 132/80  Pulse: 84  Temp: 97.5 F (36.4 C)  TempSrc: Oral  Height: 5' 6.5" (1.689 m)  Weight: 121 lb (54.885 kg)  SpO2: 99%    Gen: Pleasant, in no distress,  normal affect  ENT: No lesions,  mouth clear,  oropharynx clear, moderate postnasal drip, bilateral nasal purulence  Neck: No JVD, no TMG, no carotid bruits  Lungs: No use of accessory muscles, no dullness to percussion, distant breath  Cardiovascular: RRR, heart sounds normal, no murmur or gallops, no peripheral edema  Abdomen: soft and NT, no  HSM,  BS normal  Musculoskeletal: No deformities, no cyanosis or clubbing  Neuro: alert, non focal  Skin: Warm, no lesions or rashes  No results found.      Assessment & Plan:   No problem-specific assessment & plan notes found for this encounter.   Updated Medication List Outpatient Encounter Prescriptions as of 02/24/2013  Medication Sig  . acetaminophen (TYLENOL) 500 MG tablet Take 500 mg by mouth every 6 (six) hours as needed for moderate pain.   Marland Kitchen albuterol (PROVENTIL HFA;VENTOLIN HFA) 108 (90 BASE) MCG/ACT inhaler Inhale 2 puffs into the lungs every 4 (four) hours as needed. For shortness of breath  . albuterol (PROVENTIL) (2.5 MG/3ML) 0.083% nebulizer solution Take 2.5 mg by nebulization 4 (four) times daily. For shortness of breath  . ALPRAZolam (XANAX) 0.25 MG tablet Take 0.25 mg by mouth at bedtime as needed for sleep.   . cyanocobalamin (,VITAMIN B-12,) 1000 MCG/ML  injection Inject 1,000 mcg into the muscle every 30 (thirty) days. Given on the 10th of each month  . fluticasone (FLONASE) 50 MCG/ACT nasal spray Place 2 sprays into both nostrils daily.  . Fluticasone-Salmeterol (ADVAIR DISKUS) 500-50 MCG/DOSE AEPB Inhale 1 puff into the lungs 2 (two) times daily.  Marland Kitchen guaiFENesin (MUCINEX) 600 MG 12 hr tablet Take 600 mg by mouth 2 (two) times daily.   Marland Kitchen KLOR-CON M20 20 MEQ tablet Take 20 mEq by mouth daily.   Marland Kitchen loratadine (CLARITIN) 10 MG tablet Take 1 tablet (10 mg total) by mouth daily.  . meloxicam (MOBIC) 15 MG tablet Take 15 mg by mouth daily.   Marland Kitchen tiotropium (SPIRIVA HANDIHALER) 18 MCG inhalation capsule Place 1 capsule (18 mcg total) into inhaler and inhale daily.  . verapamil (CALAN-SR) 240 MG CR tablet Take 1 tablet (240 mg total) by mouth at bedtime.  . [DISCONTINUED] amoxicillin-clavulanate (AUGMENTIN) 875-125 MG per tablet Take 1 tablet by mouth 2 (two) times daily.  . [DISCONTINUED] predniSONE (DELTASONE) 10 MG tablet Take 4 for two days three for two days two for  two days one for two days

## 2013-02-24 NOTE — Patient Instructions (Signed)
Stop Advair .  Begin Dulera 2 puff Twice daily  , rinse after use   Continue on Oxygen 2l/m with walking and At bedtime   Continue on Spiriva daily  Mucinex DM Twice daily  As needed cough/congestion  Advance activity as tolerated.  Claritin daily  Continue on Prilosec 20mg   Twice daily   Follow up Dr. Vassie Loll  In 4 weeks and As needed   Please contact office for sooner follow up if symptoms do not improve or worsen or seek emergency care

## 2013-02-24 NOTE — Assessment & Plan Note (Signed)
PET positive Lt lower lung nodule increasing in size s/p bronchoscopy 11/4 and developed Lt PTX after procedure. Pathology results from the bronchoscopy were reported as negative for malignancy but did show atypical cells. 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 And she was started on rocephin & pred taper for COPD flare.  Will rechceck CT chest in 3 months

## 2013-02-24 NOTE — Telephone Encounter (Signed)
D/w dr Ambrose Mantle - gyn Pt refused MRI for ovarian mass - rpt [place] planned in 3-19mnths

## 2013-02-24 NOTE — Assessment & Plan Note (Signed)
PET did have positive area on left ovary. Left ovary could not be seen on pelvic US to clarify PET positive.   follow up with GYN today to discuss

## 2013-02-24 NOTE — Assessment & Plan Note (Addendum)
Recurrent flare -slow to resolve  ? advair irritating the upper airway   Plan  Stop Advair .  Begin Dulera 2 puff Twice daily  , rinse after use   Continue on Oxygen 2l/m with walking and At bedtime   Continue on Spiriva daily  Mucinex DM Twice daily  As needed cough/congestion  Advance activity as tolerated.  Claritin daily  Continue on Prilosec 20mg   Twice daily   Follow up Dr. Vassie Loll  In 4 weeks and As needed   Please contact office for sooner follow up if symptoms do not improve or worsen or seek emergency care

## 2013-02-24 NOTE — Telephone Encounter (Signed)
I called and made Dr. Vassie Loll aware. Will forward to his box.

## 2013-02-27 ENCOUNTER — Telehealth: Payer: Self-pay | Admitting: Pulmonary Disease

## 2013-02-27 MED ORDER — PREDNISONE 10 MG PO TABS: ORAL_TABLET | ORAL | Status: DC

## 2013-02-27 NOTE — Telephone Encounter (Signed)
Mucinex 600 bid Take pred only if wheezing is worse Prednisone 10 mg tabs  Take 2 tabs daily with food x 5ds, then 1 tab daily with food x 5ds then STOP Call for Abx if sputum changes color

## 2013-02-27 NOTE — Telephone Encounter (Signed)
Pt aware of recs. RX has been sent. Nothing further needed 

## 2013-02-27 NOTE — Telephone Encounter (Signed)
Called spoke with patient who c/o increased SOB onset yesterday with dry cough, chest tightness, some head congestion w/ PND, clear nasal drainage.  Denies f/c/s, hemoptysis.  Pt recently finished pred taper and augmentin from 11.28.14 acute visit with PW and just saw TP on 12.9.14: Patient Instructions     Stop Advair .  Begin Dulera 2 puff Twice daily , rinse after use  Continue on Oxygen 2l/m with walking and At bedtime  Continue on Spiriva daily  Mucinex DM Twice daily As needed cough/congestion  Advance activity as tolerated.  Claritin daily  Continue on Prilosec 20mg  Twice daily  Follow up Dr. Vassie Loll In 4 weeks and As needed  Please contact office for sooner follow up if symptoms do not improve or worsen or seek emergency care    Patient stated that she has a daughter graduating from college this evening and will not be able to come in for ov.  Requesting rx be called in to [facility].   TP scheduled off today; will route to RA and send a page.  Dr Vassie Loll please advise, thank you. Allergies  Allergen Reactions  . Aspirin Swelling    Lips and face swelled.  Warden Fillers [Oxycodone-Aspirin] Other (See Comments)    "went out of my head"

## 2013-03-03 ENCOUNTER — Telehealth: Payer: Self-pay | Admitting: Pulmonary Disease

## 2013-03-03 NOTE — Telephone Encounter (Signed)
Returning call can be reached at [phone].Shawna [name]

## 2013-03-03 NOTE — Telephone Encounter (Signed)
lmtcb x1 

## 2013-03-03 NOTE — Telephone Encounter (Signed)
Spoke to pt. States that she is SOB even with her O2 and pred taper. RA started her on pred taper on 02/27/13. She is currently on 2lpm. I advised pt to turn her O2 up to 3lpm until she hears back from [place].  SN - please advise in RA's absence. Thanks!

## 2013-03-03 NOTE — Telephone Encounter (Signed)
Per SN: If pt is using O2 and taking the prednisone then needs to go to ER. If pt is feeling fine on the 3 liters then can leave O2 on 3 liters  I called and spoke with pt and she reports the 3 liters has imporved her breathing since turning the O2 up. She will see how she does. Nothing further needed

## 2013-03-04 LAB — AFB CULTURE WITH SMEAR (NOT AT ARMC)

## 2013-03-20 ENCOUNTER — Encounter: Payer: Self-pay | Admitting: Internal Medicine

## 2013-03-20 ENCOUNTER — Ambulatory Visit (INDEPENDENT_AMBULATORY_CARE_PROVIDER_SITE_OTHER)
Admission: RE | Admit: 2013-03-20 | Discharge: 2013-03-20 | Disposition: A | Discharge status: Home / Self Care | Payer: Medicare Other | Source: Ambulatory Visit | Attending: Internal Medicine | Admitting: Internal Medicine

## 2013-03-20 ENCOUNTER — Ambulatory Visit (INDEPENDENT_AMBULATORY_CARE_PROVIDER_SITE_OTHER): Payer: Medicare Other | Admitting: Internal Medicine

## 2013-03-20 ENCOUNTER — Telehealth: Payer: Self-pay | Admitting: Pulmonary Disease

## 2013-03-20 VITALS — BP 140/80 | HR 92 | Temp 97.7°F | Ht 66.5 in | Wt 125.0 lb

## 2013-03-20 DIAGNOSIS — J449 Chronic obstructive pulmonary disease, unspecified: Secondary | ICD-10-CM

## 2013-03-20 DIAGNOSIS — J189 Pneumonia, unspecified organism: Secondary | ICD-10-CM

## 2013-03-20 NOTE — Telephone Encounter (Signed)
Will need CXR & MD evaluation. Acute Ov if possible, if not then ER if worse

## 2013-03-20 NOTE — Patient Instructions (Addendum)
Try off spiriva  Prilosec 20/ nexium Take 30- 60 min before your first and last meals of the day  Prednisone 40 mg x 3 day, 30 x 3 days,  20 x 3 days and 10 x 3 days For cough / congestion>  use up to 1200 mucinex every 12 hours and flutter valve For short of breath > nebulized albuterol up to 4 hours as needed    GERD (REFLUX)  is an extremely common cause of respiratory symptoms, many times with no significant heartburn at all.    It can be treated with medication, but also with lifestyle changes including avoidance of late meals, excessive alcohol, smoking cessation, and avoid fatty foods, chocolate, peppermint, colas, red wine, and acidic juices such as orange juice.  NO MINT OR MENTHOL PRODUCTS SO NO COUGH DROPS  USE SUGARLESS CANDY INSTEAD (jolley ranchers or Stover's)  NO OIL BASED VITAMINS - use powdered substitutes.    Keep appt to See Shawna [name] or Shawna [name]

## 2013-03-20 NOTE — Telephone Encounter (Signed)
lmomtcb x1 

## 2013-03-20 NOTE — Progress Notes (Signed)
Subjective:    Patient ID: Shawna [name], female    DOB: Apr 24, 1945    MRN: [id number]      68 year old ex smoker with COPD -gold B,  RUL lobectomy in April 2013 for benign lesion (hendrickson).  Admitted 02/02/12 for AECOPD and CAP.  She has been on home O2 since 6/13 (by PCP)  Quit smoking 3/13 but relapsed in 2014  ABGs showed compensated hypercarbia.  3/ 2013 PFTs - DLCO 50%, FEV1 1.68 - 64% (pre-op)        02/02/13 [facility] follow up  Readmitted 01/26/13 for recurrent left pneumothorax after left transbronchial biopsy for PET positive nodule & LL atelectasis. She presented with PET positive Lt lower lung nodule increasing in size s/p bronchoscopy 11/4 and developed Lt PTX after procedure. Therapeutic interventions included: Chest tube placement on 11/5 and serial CXRs. PTX resolved. Pt transitioned to water seal, then CT was removed on 11/7  And discharged on 11/8.  Pathology results from the bronchoscopy were reported as negative for malignancy but did show atypical cells. 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 And she was started on rocephin & pred taper for COPD flare.  Only additional concern of note is of on-going hyponatremia. Her urine lytes, cortisol, and Tsh were evaluated.   felt that this was likely due to her HCTZ and was stopped.  PET did have positive area on left ovary. Left ovary could not be seen on pelvic US to clarify PET positive.  On Nov 9th, the patient developed increasingly severe SOB which would worsen with bending at the waist. She was unable to sleep overnight - CXR in ED showed larger left pnthx & wayne chest tube was emergently inserted.  She was again admitted to the hospital. Initially the CT was placed on suction, again transitioned to water seal AND also clamped for several hours on 11/13. Her CXR cleared, it remained free of PTX on 11/13 and 11/14 and so her CT was again removed.  CXR w/out PTX .   Since discharge she is feeling better. No  chest pain or increased cough . No fever.  Has few days left on abx. -Levaquin  CXR today shows no PTX. No acute infiltrate noted.  No hemotysis, n/v/d.    02/13/2013 Chief Complaint  Patient presents with  . Acute Visit    Pt c/o having increased SOB with minimal activity, increased chest congestion and dry cough.  Pt also c/o chest tightness all x 3 days.  Pt had Advair 500/50 on hand so she took a dose last night and this morning and feels it helped some. Pt denies any chest pain, fever, body aches/chills.    Pt notes more dyspnea, noted more sneezing and coughing and runny nose.  Pt then noted more dyspnea over the week. Cough is dry. Feels loose in chest. No real chest pain  No f/c/s.  Had been holding advair d/t voice issues. Pt in hosp 3 times since 11/2012 >>augmentin and steroid taper .   02/24/2013 Follow up  3 week follow up COPD - reports finished augmentin & pred last week and symptoms returned 2 days ago with hoarseness, sneezing, increased SOB, dry cough, chest congestion, some chest tightness.  has not taken advair x2 days d/t hoarseness. Says that throat gets sore after use and becomes hoarse.  Cough is mainly dry . No fever, discolored mucus , chest pain, orthopnea, edema . cxr last ov with chronic changes , no acute process.  Does have relux and some drainage in throat.  Taking claritin and prilosec .   Has an ov with GYN   to discuss possible ovarian mass. No weight loss. rec Stop Advair .  Begin Dulera 2 puff Twice daily  , rinse after use   Continue on Oxygen 2l/m with walking and At bedtime   Continue on Spiriva daily  Mucinex DM Twice daily  As needed cough/congestion  Advance activity as tolerated.  Claritin daily  Continue on Prilosec 20mg   Twice daily   Follow up Dr. Elsworth Soho  In 4 weeks and As needed       03/20/2013 f/u ov/Johntavious Francom re: copd flare Chief Complaint  Patient presents with  . Acute Visit    CXR done today.  SOB, tightness in chest, cough and  wheezing worsening sicne 03/17/13 Dr. Elsworth Soho pt.  not right since first week in December 2015 except  some better p prednisone   No obvious day to day or daytime variabilty or assoc purulent sputum, cp or chest tightness overt sinus or hb symptoms. No unusual exp hx or h/o childhood pna/ asthma or knowledge of premature birth.  Sleeping ok without nocturnal  or early am exacerbation  of respiratory  c/o's or need for noct saba. Also denies any obvious fluctuation of symptoms with weather or environmental changes or other aggravating or alleviating factors except as outlined above   Current Medications, Allergies, Complete Past Medical History, Past Surgical History, Family History, and Social History were reviewed in [employer] record.  ROS  The following are not active complaints unless bolded sore throat, dysphagia, dental problems, itching, sneezing,  nasal congestion or excess/ purulent secretions, ear ache,   fever, chills, sweats, unintended wt loss, pleuritic or exertional cp, hemoptysis,  orthopnea pnd or leg swelling, presyncope, palpitations, heartburn, abdominal pain, anorexia, nausea, vomiting, diarrhea  or change in bowel or urinary habits, change in stools or urine, dysuria,hematuria,  rash, arthralgias, visual complaints, headache, numbness weakness or ataxia or problems with walking or coordination,  change in mood/affect or memory.            Past Medical History  Diagnosis Date  . COPD (chronic obstructive pulmonary disease)   . HTN (hypertension)   . B12 deficiency   . OA (osteoarthritis)   . Hypokalemia   . Mass of lung   . GERD (gastroesophageal reflux disease)   . Diverticulosis of colon (without mention of hemorrhage)   . 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 Pulmonary nodule             Objective:   Physical Exam  Wt Readings from Last 3 Encounters:  03/20/13 125 lb (56.7 kg)  02/24/13 121 lb (54.885 kg)  02/13/13  120 lb 9.6 oz (54.704 kg)         Gen: w/c bound wf nad at rest, talking in full sentences with barking upper airway pattern cough  ENT: No lesions,  mouth clear,  oropharynx clear, moderate postnasal drip, bilateral nasal purulence  Neck: No JVD, no TMG, no carotid bruits  Lungs: No use of accessory muscles, no dullness to percussion, distant breath  Cardiovascular: RRR, heart sounds normal, no murmur or gallops, no peripheral edema  Abdomen: soft and NT, no HSM,  BS normal  Musculoskeletal: No deformities, no cyanosis or clubbing  Neuro: alert, non focal  Skin: Warm, no lesions or rashes  No results found.   CXR  03/20/2013 :  1. Status post  right upper lobectomy with chronic postoperative appearance of the chest which appears similar to prior studies, as above. No definite acute cardiopulmonary disease. 2. Atherosclerosis.      Assessment & Plan:

## 2013-03-20 NOTE — Telephone Encounter (Signed)
Spoke with Daugther  OV with MW at 1:15 today

## 2013-03-20 NOTE — Telephone Encounter (Signed)
Returning call--spoke with daughter Pt c/o increased SOB-decreased reserve with exertion (walking to bathroom) Cough--no production Chest congestion Wheezing Denies fever Current pred rx given by PCP--10mg  BID since 03/17/13 Mucinx 600mg  BID---------- should pt increase dose? Daughter states that they are in [place] and are contemplating going to ED. I advised the daughter that if she feels her mother is not in any distress then ok to return home--her call. After speaking with daughter she decided to take pt back home and await our call.  Allergies  Allergen Reactions  . Aspirin Swelling    Lips and face swelled.  [name] [Oxycodone-Aspirin] Other (See Comments)    "went out of my head"   Please advise Dr Elsworth Soho. Thanks.

## 2013-03-23 ENCOUNTER — Telehealth: Payer: Self-pay | Admitting: Pulmonary Disease

## 2013-03-23 ENCOUNTER — Telehealth: Payer: Self-pay | Admitting: Gastroenterology

## 2013-03-23 DIAGNOSIS — J189 Pneumonia, unspecified organism: Secondary | ICD-10-CM | POA: Insufficient documentation

## 2013-03-23 MED ORDER — LEVOFLOXACIN 500 MG PO TABS: 500.0000 mg | ORAL_TABLET | Freq: Every day | ORAL | Status: DC

## 2013-03-23 MED ORDER — IPRATROPIUM BROMIDE 0.02 % IN SOLN: 0.5000 mg | Freq: Four times a day (QID) | RESPIRATORY_TRACT | Status: DC

## 2013-03-23 NOTE — Telephone Encounter (Signed)
Called and spoke with pt and she stated that she was seen on 03/20/13 by MW.  Pt stated that she is not any better, but could not say that she was worse. She stated that she can hardly walk across the room without being SOB and having to rest.   Pt requested that this message be sent to RA.  RA please advise. Thanks  Allergies  Allergen Reactions  . Aspirin Swelling    Lips and face swelled.  [name] [Oxycodone-Aspirin] Other (See Comments)    "went out of my head"     Current Outpatient Prescriptions on File Prior to Visit  Medication Sig Dispense Refill  . acetaminophen (TYLENOL) 500 MG tablet Take 500 mg by mouth every 6 (six) hours as needed for moderate pain.       Marland Kitchen albuterol (PROVENTIL HFA;VENTOLIN HFA) 108 (90 BASE) MCG/ACT inhaler Inhale 2 puffs into the lungs every 4 (four) hours as needed. For shortness of breath      . albuterol (PROVENTIL) (2.5 MG/3ML) 0.083% nebulizer solution Take 2.5 mg by nebulization 4 (four) times daily. For shortness of breath      . ALPRAZolam (XANAX) 0.25 MG tablet Take 0.25 mg by mouth at bedtime as needed for sleep.       . cyanocobalamin (,VITAMIN B-12,) 1000 MCG/ML injection Inject 1,000 mcg into the muscle every 30 (thirty) days. Given on the 10th of each month      . fluticasone (FLONASE) 50 MCG/ACT nasal spray Place 2 sprays into both nostrils daily.  16 g  2  . guaiFENesin (MUCINEX) 600 MG 12 hr tablet Take 600 mg by mouth 2 (two) times daily.       Marland Kitchen KLOR-CON M20 20 MEQ tablet Take 20 mEq by mouth daily.       Marland Kitchen loratadine (CLARITIN) 10 MG tablet Take 1 tablet (10 mg total) by mouth daily.  30 tablet  0  . meloxicam (MOBIC) 15 MG tablet Take 15 mg by mouth daily.       . predniSONE (DELTASONE) 5 MG tablet Take 5 mg by mouth daily with breakfast. 2 tabs in a.m and 2 tabs in p.m = 20mg  daily      . verapamil (CALAN-SR) 240 MG CR tablet Take 1 tablet (240 mg total) by mouth at bedtime.  30 tablet  6   No current facility-administered medications  on file prior to visit.

## 2013-03-23 NOTE — Assessment & Plan Note (Addendum)
3/ 2013 PFTs - DLCO 50%, FEV1 1.68 - 64% (pre-op) with ratio 54% and no change p B2  So likely is a GOLD III now with difficult to control symptoms with prominent upper airway pattern coughing   DDX of  difficult airways managment all start with A and  include Adherence, Ace Inhibitors, Acid Reflux, Active Sinus Disease, Alpha 1 Antitripsin deficiency, Anxiety masquerading as Airways dz,  ABPA,  allergy(esp in young), Aspiration (esp in elderly), Adverse effects of DPI,  Active smokers, plus two Bs  = Bronchiectasis and Beta blocker use..and one C= CHF  Adherence is always the initial "prime suspect" and is a multilayered concern that requires a "trust but verify" approach in every patient - starting with knowing how to use medications, especially inhalers, correctly, keeping up with refills and understanding the fundamental difference between maintenance and prns vs those medications only taken for a very short course and then stopped and not refilled.   ? Acid (or non-acid) GERD > always difficult to exclude as up to 75% of pts in some series report no assoc GI/ Heartburn symptoms> rec max (24h)  acid suppression and diet restrictions/ reviewed and instructions given in writing.   ? Adverse effects of dpi > try off spiriva

## 2013-03-23 NOTE — Telephone Encounter (Signed)
Pt aware of recs and rx sent. Nothing further needed 

## 2013-03-23 NOTE — Telephone Encounter (Signed)
I called patient back and patient states that she is having burning in her throat again but tongue is not white

## 2013-03-23 NOTE — Telephone Encounter (Signed)
levaquin 500 x7ds Atrovent nebs 4 times/d (can be taken with albuterol) - pl call into [facility]

## 2013-03-23 NOTE — Assessment & Plan Note (Signed)
No evidence of pneumonia clinically or by cxr, no abx indicated

## 2013-03-24 MED ORDER — NYSTATIN 100000 UNIT/ML MT SUSP: 5.0000 mL | Freq: Four times a day (QID) | OROMUCOSAL | Status: DC

## 2013-03-24 NOTE — Telephone Encounter (Signed)
ok 

## 2013-03-24 NOTE — Telephone Encounter (Signed)
Dr Sharlett Iles, pt called for a refill on Nystatin. Informed Claiborne Billings to ask if her mouth and or throat were coated in white and pt stated no, but her throat is burning. Pt has chronic COPD and Dr Dagmar Hait placed her on Levaquin and Atrovent yesterday. OK to order Nystatin? Thanks.

## 2013-03-25 NOTE — Telephone Encounter (Signed)
Informed pt we ordered the Nystatin. Pt sounds very SOB; she is on Levaquin and an inhaler. Encouraged pt to call back to Pulmonology with her s&s and she stated understanding.

## 2013-03-30 ENCOUNTER — Telehealth: Payer: Self-pay | Admitting: Pulmonary Disease

## 2013-03-30 NOTE — Telephone Encounter (Signed)
Called and spoke with pt and she stated that she will be done with abx and pred  Tomorrow.  She stated that she is still having coughing spells at times---sometimes with yellow sputum.  She denies any fever.  She stated that the chest congestion sounds loose in her chest but she is not able to get this stuff out.   She stated that if she sits all the time her SOB is gone with 2 liters, but when she gets up to move around she can hardly breath even with the oxygen.  RA please advise. Thanks  Allergies  Allergen Reactions  . Aspirin Swelling    Lips and face swelled.  [name] [Oxycodone-Aspirin] Other (See Comments)    "went out of my head"     Current Outpatient Prescriptions on File Prior to Visit  Medication Sig Dispense Refill  . acetaminophen (TYLENOL) 500 MG tablet Take 500 mg by mouth every 6 (six) hours as needed for moderate pain.       Marland Kitchen albuterol (PROVENTIL HFA;VENTOLIN HFA) 108 (90 BASE) MCG/ACT inhaler Inhale 2 puffs into the lungs every 4 (four) hours as needed. For shortness of breath      . albuterol (PROVENTIL) (2.5 MG/3ML) 0.083% nebulizer solution Take 2.5 mg by nebulization 4 (four) times daily. For shortness of breath      . ALPRAZolam (XANAX) 0.25 MG tablet Take 0.25 mg by mouth at bedtime as needed for sleep.       . cyanocobalamin (,VITAMIN B-12,) 1000 MCG/ML injection Inject 1,000 mcg into the muscle every 30 (thirty) days. Given on the 10th of each month      . fluticasone (FLONASE) 50 MCG/ACT nasal spray Place 2 sprays into both nostrils daily.  16 g  2  . guaiFENesin (MUCINEX) 600 MG 12 hr tablet Take 600 mg by mouth 2 (two) times daily.       Marland Kitchen ipratropium (ATROVENT) 0.02 % nebulizer solution Take 2.5 mLs (0.5 mg total) by nebulization 4 (four) times daily. Dx 496  360 mL  0  . KLOR-CON M20 20 MEQ tablet Take 20 mEq by mouth daily.       Marland Kitchen levofloxacin (LEVAQUIN) 500 MG tablet Take 1 tablet (500 mg total) by mouth daily.  7 tablet  0  . loratadine (CLARITIN) 10 MG  tablet Take 1 tablet (10 mg total) by mouth daily.  30 tablet  0  . meloxicam (MOBIC) 15 MG tablet Take 15 mg by mouth daily.       Marland Kitchen nystatin (MYCOSTATIN) 100000 UNIT/ML suspension Take 5 mLs (500,000 Units total) by mouth 4 (four) times daily.  60 mL  1  . predniSONE (DELTASONE) 5 MG tablet Take 5 mg by mouth daily with breakfast. 2 tabs in a.m and 2 tabs in p.m = 20mg  daily      . verapamil (CALAN-SR) 240 MG CR tablet Take 1 tablet (240 mg total) by mouth at bedtime.  30 tablet  6   No current facility-administered medications on file prior to visit.

## 2013-03-30 NOTE — Telephone Encounter (Signed)
Called and spoke with pt. She is aware of RA recs. She will monitor how she does. Nothing further needed

## 2013-03-30 NOTE — Telephone Encounter (Signed)
Im not sure more medications will be of benefit She is quite deconditioned from her recent illness & will need time to recover I can see her in office if needed, otherwise wait on rpt CT in feb

## 2013-03-31 ENCOUNTER — Telehealth: Payer: Self-pay | Admitting: Pulmonary Disease

## 2013-03-31 MED ORDER — OSELTAMIVIR PHOSPHATE 75 MG PO CAPS: 75.0000 mg | ORAL_CAPSULE | Freq: Two times a day (BID) | ORAL | Status: DC

## 2013-03-31 NOTE — Telephone Encounter (Signed)
I called and spoke with pt. Aware of recs. RX has been sent

## 2013-03-31 NOTE — Telephone Encounter (Signed)
I called and spoke with pt. Per pt her grandson was dx w/ flu yesterday. She reports she was around him on Sunday. Pt denies any fever, no chills. No sweats, no body aches, no nausea, no vomiting. She is wanting to know if she needs to take any precautions. Please advise Dr. Elsworth Soho thanks  Allergies  Allergen Reactions  . Aspirin Swelling    Lips and face swelled.  [name] [Oxycodone-Aspirin] Other (See Comments)    "went out of my head"

## 2013-03-31 NOTE — Telephone Encounter (Signed)
tamiflu 50 bid x 5 days

## 2013-04-07 ENCOUNTER — Telehealth: Payer: Self-pay | Admitting: Pulmonary Disease

## 2013-04-07 ENCOUNTER — Ambulatory Visit (INDEPENDENT_AMBULATORY_CARE_PROVIDER_SITE_OTHER)
Admission: RE | Admit: 2013-04-07 | Discharge: 2013-04-07 | Disposition: A | Discharge status: Home / Self Care | Payer: Medicare Other | Source: Ambulatory Visit | Attending: Adult Health | Admitting: Adult Health

## 2013-04-07 ENCOUNTER — Ambulatory Visit (INDEPENDENT_AMBULATORY_CARE_PROVIDER_SITE_OTHER): Payer: Medicare Other | Admitting: Adult Health

## 2013-04-07 ENCOUNTER — Encounter: Payer: Self-pay | Admitting: Adult Health

## 2013-04-07 ENCOUNTER — Other Ambulatory Visit (INDEPENDENT_AMBULATORY_CARE_PROVIDER_SITE_OTHER): Payer: Medicare Other

## 2013-04-07 VITALS — BP 104/60 | HR 92 | Temp 98.2°F | Ht 66.5 in | Wt 122.0 lb

## 2013-04-07 DIAGNOSIS — R059 Cough, unspecified: Secondary | ICD-10-CM

## 2013-04-07 DIAGNOSIS — R0602 Shortness of breath: Secondary | ICD-10-CM

## 2013-04-07 DIAGNOSIS — R05 Cough: Secondary | ICD-10-CM

## 2013-04-07 DIAGNOSIS — J441 Chronic obstructive pulmonary disease with (acute) exacerbation: Secondary | ICD-10-CM

## 2013-04-07 LAB — CBC WITH DIFFERENTIAL/PLATELET
Basophils Absolute: 0 10*3/uL (ref 0.0–0.1)
Basophils Relative: 0.2 % (ref 0.0–3.0)
EOS ABS: 0.1 10*3/uL (ref 0.0–0.7)
EOS PCT: 0.5 % (ref 0.0–5.0)
HEMATOCRIT: 40.6 % (ref 36.0–46.0)
Hemoglobin: 13.8 g/dL (ref 12.0–15.0)
LYMPHS ABS: 0.5 10*3/uL — AB (ref 0.7–4.0)
Lymphocytes Relative: 4.4 % — ABNORMAL LOW (ref 12.0–46.0)
MCHC: 34 g/dL (ref 30.0–36.0)
MCV: 90.4 fl (ref 78.0–100.0)
MONO ABS: 0.5 10*3/uL (ref 0.1–1.0)
Monocytes Relative: 4.9 % (ref 3.0–12.0)
Neutro Abs: 9.5 10*3/uL — ABNORMAL HIGH (ref 1.4–7.7)
Neutrophils Relative %: 90 % — ABNORMAL HIGH (ref 43.0–77.0)
Platelets: 153 10*3/uL (ref 150.0–400.0)
RBC: 4.49 Mil/uL (ref 3.87–5.11)
RDW: 15.1 % — ABNORMAL HIGH (ref 11.5–14.6)
WBC: 10.6 10*3/uL — ABNORMAL HIGH (ref 4.5–10.5)

## 2013-04-07 LAB — BASIC METABOLIC PANEL
BUN: 22 mg/dL (ref 6–23)
CALCIUM: 9 mg/dL (ref 8.4–10.5)
CO2: 28 mEq/L (ref 19–32)
Chloride: 96 mEq/L (ref 96–112)
Creatinine, Ser: 1.2 mg/dL (ref 0.4–1.2)
GFR: 48.49 mL/min — AB (ref >60.00)
GLUCOSE: 108 mg/dL — AB (ref 70–99)
Potassium: 4 mEq/L (ref 3.5–5.1)
SODIUM: 132 meq/L — AB (ref 135–145)

## 2013-04-07 MED ORDER — PREDNISONE 10 MG PO TABS: ORAL_TABLET | ORAL | Status: DC

## 2013-04-07 NOTE — Progress Notes (Signed)
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  Admitted 02/02/12 for AECOPD and CAP.  She has been on home O2 since 6/13 (by PCP)  Quit smoking 3/13 but relapsed in 2014  ABGs showed compensated hypercarbia.  3/ 2013 PFTs - DLCO 50%, FEV1 1.68 - 64% (pre-op)   02/02/13 [facility] follow up  Readmitted 01/26/13 for recurrent left pneumothorax after left transbronchial biopsy for PET positive nodule & LL atelectasis. She presented with PET positive Lt lower lung nodule increasing in size s/p bronchoscopy 11/4 and developed Lt PTX after procedure. Therapeutic interventions included: Chest tube placement on 11/5 and serial CXRs. PTX resolved. Pt transitioned to water seal, then CT was removed on 11/7  And discharged on 11/8.  Pathology results from the bronchoscopy were reported as negative for malignancy but did show atypical cells. 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 And she was started on rocephin & pred taper for COPD flare.  Only additional concern of note is of on-going hyponatremia. Her urine lytes, cortisol, and Tsh were evaluated.   felt that this was likely due to her HCTZ and was stopped.  PET did have positive area on left ovary. Left ovary could not be seen on pelvic US to clarify PET positive.  On Nov 9th, the patient developed increasingly severe SOB which would worsen with bending at the waist. She was unable to sleep overnight - CXR in ED showed larger left pnthx & wayne chest tube was emergently inserted.  She was again admitted to the hospital. Initially the CT was placed on suction, again transitioned to water seal AND also clamped for several hours on 11/13. Her CXR cleared, it remained free of PTX on 11/13 and 11/14 and so her CT was again removed.  CXR w/out PTX .   Since discharge she is feeling better. No chest pain or  increased cough . No fever.  Has few days left on abx. -Levaquin  CXR today shows no PTX. No acute infiltrate noted.  No hemotysis, n/v/d.    02/13/2013 Chief Complaint  Patient presents with  . Acute Visit    Pt c/o having increased SOB with minimal activity, increased chest congestion and dry cough.  Pt also c/o chest tightness all x 3 days.  Pt had Advair 500/50 on hand so she took a dose last night and this morning and feels it helped some. Pt denies any chest pain, fever, body aches/chills.    Pt notes more dyspnea, noted more sneezing and coughing and runny nose.  Pt then noted more dyspnea over the week. Cough is dry. Feels loose in chest. No real chest pain  No f/c/s.  Had been holding advair d/t voice issues. Pt in hosp 3 times since 11/2012 >>augmentin and steroid taper .   02/24/2013 Follow up  3 week follow up COPD - reports finished augmentin & pred last week and symptoms returned 2 days ago with hoarseness, sneezing, increased SOB, dry cough, chest congestion, some chest tightness.  has not taken advair x2 days d/t hoarseness. Says that throat gets sore after use and becomes hoarse.  Cough is mainly dry . No fever, discolored mucus , chest pain, orthopnea, edema . cxr last ov with chronic changes , no acute process.  Does have relux and some drainage in throat.  Taking claritin and prilosec .   Has an ov with GYN   to discuss possible ovarian mass. No weight loss. rec Stop Advair .  Begin Dulera 2 puff Twice daily  , rinse after use   Continue on Oxygen 2l/m with walking and At bedtime   Continue on Spiriva daily  Mucinex DM Twice daily  As needed cough/congestion  Advance activity as tolerated.  Claritin daily  Continue on Prilosec 20mg   Twice daily   Follow up Dr. Elsworth Soho  In 4 weeks and As needed       03/20/2013 f/u ov/Wert re: copd flare Chief Complaint  Patient presents with  . Acute Visit    CXR done today.  SOB, tightness in chest, cough and wheezing worsening  sicne 03/17/13 Dr. Elsworth Soho pt.  not right since first week in December 2015 except  some better p prednisone  >>d/c spiriva , albuterol neb .q4  , pred taper   04/07/2013 Acute OV  Returns for persistent symptoms .  Complains of increased SOB, chest congestion, dry cough, runny nose, right lung pain for 1 mon . Patient says over the last month. She feels that she is going downhill.  She complains of increased cough, congestion, and wheezing. Symptoms seem to worsen approximately 2 weeks ago . She was seen in the office , and given a prednisone taper.  She was taken off the Spiriva and recommended to albuterol nebulizer as needed. Symptoms only improved minimally. And she was called in [place] for 7 days. On January 5. Says that she did feel better while on antibiotics, and prednisone. However, once he stopped his symptoms returned with cough, congestion, wheezing. She does have pain in her right ribs, when she takes in a deep breath. Chest x-ray last visit showed chronic changes, without any acute process noted. Says that her appetite is down, but denies nausea, vomiting, diarrhea. She is currently on Tamiflu for exposure to family member. That was tested positive for the flu. She denies any fever or body aches. Chest x-ray today shows small left pleural effusion .  Patient denies any hemoptysis, orthopnea, PND, or increased leg swelling or calf pain.   Current Medications, Allergies, Complete Past Medical History, Past Surgical History, Family History, and Social History were reviewed in [employer] record.  ROS  The following are not active complaints unless bolded sore throat, dysphagia, dental problems, itching, sneezing,  nasal congestion or excess  chills, sweats, unintended wt loss, pleuritic or exertional cp, hemoptysis,  orthopnea pnd or leg swelling, presyncope, palpitations, heartburn, abdominal pain, anorexia, nausea, vomiting, diarrhea  or change in bowel or  urinary habits, change in stools or urine, dysuria,hematuria,  rash, arthralgias, visual complaints, headache, numbness weakness or ataxia or problems with walking or coordination,  change in mood/affect or memory.         Objective:   Physical Exam  Gen: w/c bound wf nad at rest, talking in full sentences   ENT: No lesions,  mouth clear,  oropharynx clear.   Neck: No JVD, no TMG, no carotid bruits  Lungs: No use of accessory muscles, no dullness to percussion, distant breath sounds in bases   Cardiovascular: RRR, heart sounds normal, no murmur or gallops, none-tr  peripheral edema, neg homans sign .  Abdomen: soft and NT, no HSM,  BS normal  Musculoskeletal: No deformities, no cyanosis or clubbing  Neuro: alert, non focal  Skin: Warm, no lesions or rashes  No results found.   CXR  03/20/2013 :  1. Status post right upper lobectomy with chronic postoperative appearance of the chest which appears similar to prior studies, as above. No definite acute cardiopulmonary disease. 2. Atherosclerosis.   cxr 04/07/2013  COPD. Postsurgical changes on the right which are stable. Small left pleural effusion, new since prior study.       Assessment & Plan:

## 2013-04-07 NOTE — Assessment & Plan Note (Addendum)
Slow to resolve COPD exacerbation -sats are adequate on O2 , VSS .  Discussed hospital admission w/ pt and daughter-pt declines.  Aware that is symptoms do not improve or worsen will need sooner ov or ER. They are aware.  ? New Left pleural effusion - will check CT chest w/ PE protocol  Labs pending w/ cbc, bmet, and bnp.  Add BREO (off advair, dulera and spiriva ) ? Contributing to increase symptoms.  >discussed case with Dr. Gwenette Greet (in Dr. Elsworth Soho absence ) regarding case and plan below  Plan  We are setting you up for a CT chest  For tomorrow.  IF you change your mind on hospital admission , call back sooner.  IF worse will need to go to ER.  Prednisone taper over next week.  Begin Breo 1 puff daily  Continue on Oxygen 2l/m  Continue on Albuterol /Ipratropium Neb Four times a day   Mucinex DM Twice daily  As needed cough/congestion  Advance activity as tolerated.  Follow up Dr. Elsworth Soho  In 2-3 weeks and As needed   Please contact office for sooner follow up if symptoms do not improve or worsen or seek emergency care

## 2013-04-07 NOTE — Patient Instructions (Signed)
We are setting you up for a CT chest  For tomorrow.  IF you change your mind on hospital admission , call back sooner.  IF worse will need to go to ER.  Prednisone taper over next week.  Begin Breo 1 puff daily  Continue on Oxygen 2l/m  Continue on Albuterol /Ipratropium Neb Four times a day   Mucinex DM Twice daily  As needed cough/congestion  Advance activity as tolerated.  Follow up Dr. Elsworth Soho  In 2-3 weeks and As needed   Please contact office for sooner follow up if symptoms do not improve or worsen or seek emergency care

## 2013-04-07 NOTE — Telephone Encounter (Signed)
Spoke with the pt daughter and she states the pt has taken abx, prednisone and tamiflu and does not feel any better. She states she is very SOB with any activity even with oxygen use. She is also SOB at rest as well.  Pt has an appt with TP on 04-27-13 but appt set for today at 4:15. Danielson Bing, CMA

## 2013-04-08 ENCOUNTER — Ambulatory Visit (INDEPENDENT_AMBULATORY_CARE_PROVIDER_SITE_OTHER)
Admission: RE | Admit: 2013-04-08 | Discharge: 2013-04-08 | Disposition: A | Discharge status: Home / Self Care | Payer: Medicare Other | Source: Ambulatory Visit | Attending: Adult Health | Admitting: Adult Health

## 2013-04-08 ENCOUNTER — Telehealth: Payer: Self-pay | Admitting: *Deleted

## 2013-04-08 ENCOUNTER — Ambulatory Visit: Payer: Medicare Other

## 2013-04-08 DIAGNOSIS — R0602 Shortness of breath: Secondary | ICD-10-CM

## 2013-04-08 LAB — BRAIN NATRIURETIC PEPTIDE: PRO B NATRI PEPTIDE: 125 pg/mL — AB (ref 0.0–100.0)

## 2013-04-08 MED ORDER — IOHEXOL 350 MG/ML SOLN
80.0000 mL | Freq: Once | INTRAVENOUS | Status: AC | PRN
  Administered 2013-04-08: 80 mL via INTRAVENOUS

## 2013-04-08 MED ORDER — MOXIFLOXACIN HCL 400 MG PO TABS: 400.0000 mg | ORAL_TABLET | Freq: Every day | ORAL | Status: DC

## 2013-04-08 NOTE — Telephone Encounter (Signed)
Call report: No PE.  Called TP and read impression to TP regarding CT results. Per TP call in avelox 400 mg QD x 7 days. Have pt f/u with RA within 1 week w/ CXR before she finishes ABX.   Called pt and LMTCB x1 rx has been sent TP will also give pt a call later on this afternoon

## 2013-04-08 NOTE — Telephone Encounter (Signed)
Spoke with pt daughter. She is aware of the below. Appt scheduled with RA 04/14/13 at 2 PM. Aware she will have CXR done prior.

## 2013-04-09 ENCOUNTER — Telehealth: Payer: Self-pay | Admitting: Pulmonary Disease

## 2013-04-09 ENCOUNTER — Encounter: Payer: Self-pay | Admitting: Adult Health

## 2013-04-09 NOTE — Telephone Encounter (Signed)
Spoke with pt and advised of CT results per TP.  Explained to pt that we spoke with one of her daughters yesterday and also gave her the results.  Pt has started Avelox and has appt f/u with Dr Elsworth Soho with cxr.

## 2013-04-09 NOTE — Progress Notes (Signed)
Recurrent LLL atelectaiss with non diagnostic bronchoscopy & no obstructive lesion on Ct  Agree with Abx & FU to resolution Her symptoms are out of proportion to degree of airway obstruction.

## 2013-04-14 ENCOUNTER — Ambulatory Visit (INDEPENDENT_AMBULATORY_CARE_PROVIDER_SITE_OTHER): Payer: Medicare Other | Admitting: Pulmonary Disease

## 2013-04-14 ENCOUNTER — Encounter: Payer: Self-pay | Admitting: Pulmonary Disease

## 2013-04-14 ENCOUNTER — Ambulatory Visit (INDEPENDENT_AMBULATORY_CARE_PROVIDER_SITE_OTHER)
Admission: RE | Admit: 2013-04-14 | Discharge: 2013-04-14 | Disposition: A | Discharge status: Home / Self Care | Payer: Medicare Other | Source: Ambulatory Visit | Attending: Pulmonary Disease | Admitting: Pulmonary Disease

## 2013-04-14 VITALS — BP 146/84 | HR 65 | Temp 97.7°F | Wt 122.0 lb

## 2013-04-14 DIAGNOSIS — J189 Pneumonia, unspecified organism: Secondary | ICD-10-CM

## 2013-04-14 DIAGNOSIS — J449 Chronic obstructive pulmonary disease, unspecified: Secondary | ICD-10-CM

## 2013-04-14 MED ORDER — FLUCONAZOLE 100 MG PO TABS: 100.0000 mg | ORAL_TABLET | Freq: Every day | ORAL | Status: DC

## 2013-04-14 NOTE — Progress Notes (Signed)
Quick Note:  Pt aware ______ 

## 2013-04-14 NOTE — Patient Instructions (Signed)
Check satn on RA Stay on albuterol & atrovent nebs thrice daily STOP Breo & advair Diflucan 100 mg daily x 5 days for thrush Obtain pathology results 2013

## 2013-04-14 NOTE — Progress Notes (Signed)
   Subjective:    Patient ID: Shawna [name], female    DOB: October 02, 1945, 68 y.o.   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  Admitted 01/2012 for AECOPD and CAP.  She has been on home O2 since 08/2011 (by PCP)  Quit smoking 05/2011 but relapsed in 2014  ABGs showed compensated hypercarbia.  3/ 2013 PFTs - DLCO 50%, FEV1 1.68 - 64% (pre-op)   She presented with PET positive Lt lower lung nodule increasing in size s/p bronchoscopy 01/2013 and developed Lt PTX after procedure. This recurred x 1 but eventually resolved with chest tube placement .  Pathology results from the bronchoscopy were reported as negative for malignancy but did show atypical cells. 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 And she was started on rocephin & pred taper for COPD flare.  Additional concern of note was of on-going hyponatremia. Her urine lytes, cortisol, and Tsh were nml, HCTZ  was stopped.  PET did have positive area on left ovary bu this could not be seen on pelvic US.    04/14/13 She had persistent dyspnea & hoarseness - advair was held Pt in hosp 3 times since 11/2012  She received multiple rounds of antibiotics & steroids over past few months, ENT eval neg Chest x-ray today suggested  small left pleural effusion/ atelecatsis .  -CT confirmed Recurrent LLL atelectasis without obstructive lesion Was restarted on breo last visit   Daughter concerned about palpitations & a fibn Echo nov 2013 nml LV function She was 96 % on 2L pulse & after 2 laps on RA, desatn to 88%    Past Medical History  Diagnosis Date  . COPD (chronic obstructive pulmonary disease)   . HTN (hypertension)   . B12 deficiency   . OA (osteoarthritis)   . Hypokalemia   . Mass of lung   . GERD (gastroesophageal reflux disease)   . Diverticulosis of colon (without mention of hemorrhage)   . 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  Pulmonary nodule      Review of Systems neg for any significant sore throat, dysphagia, itching, sneezing, nasal congestion or excess/ purulent secretions, fever, chills, sweats, unintended wt loss, pleuritic or exertional cp, hempoptysis, orthopnea pnd or change in chronic leg swelling. Also denies presyncope, palpitations, heartburn, abdominal pain, nausea, vomiting, diarrhea or change in bowel or urinary habits, dysuria,hematuria, rash, arthralgias, visual complaints, headache, numbness weakness or ataxia.      Objective:   Physical Exam  Gen. Pleasant, well-nourished, in no distress, normal affect ENT - no lesions, no post nasal drip, thrush + Neck: No JVD, no thyromegaly, no carotid bruits Lungs: no use of accessory muscles, no dullness to percussion, clear without rales or rhonchi  Cardiovascular: Rhythm regular, heart sounds  normal, no murmurs or gallops, no peripheral edema Abdomen: soft and non-tender, no hepatosplenomegaly, BS normal. Musculoskeletal: No deformities, no cyanosis or clubbing Neuro:  alert, non focal       Assessment & Plan:

## 2013-04-17 ENCOUNTER — Telehealth: Payer: Self-pay | Admitting: Pulmonary Disease

## 2013-04-17 NOTE — Telephone Encounter (Signed)
Pt is aware. i also called rose and cancelled this. Nothing further needed

## 2013-04-17 NOTE — Telephone Encounter (Signed)
Since RA is scheduled off will forward to Tp to advise if pt still needs to have CT done scheduled for next week. When RA saw pt he did not mention anything about this. Please advise thanks

## 2013-04-17 NOTE — Telephone Encounter (Signed)
No

## 2013-04-20 ENCOUNTER — Telehealth: Payer: Self-pay | Admitting: Pulmonary Disease

## 2013-04-20 NOTE — Telephone Encounter (Signed)
Called and spoke with pt. She reports she woke up this AM with hands and feet swollen. They feel uncomfortable. Not red, no warm to touch. She is trying to keep her feet elevated as much as possible. She reports she did eat some chips yesterday but not sure if it is related. Offered pt an OV today with MW but refused stating she has no way and also can't afford to come in again. Offered appt tomorrow and advised me she is keeping her grandson and also can;t afford to come in.  Since TP is familiar w/ pt will forward message to her to advised on any recs. Please advise thanks  Allergies  Allergen Reactions  . Aspirin Swelling    Lips and face swelled.  [name] [Oxycodone-Aspirin] Other (See Comments)    "went out of my head"

## 2013-04-20 NOTE — Assessment & Plan Note (Signed)
Obtain pathology results 2013 Recurrent LLL atx ? Cause Follow to resolution, incentive spirometry

## 2013-04-20 NOTE — Telephone Encounter (Signed)
Not sure , what is causing  Would eat low salt diet  Keep legs elevated.  Make sure not interferring with her breathing, no flare in cough or dyspnea.  Will need to see PCP if not improving

## 2013-04-20 NOTE — Telephone Encounter (Signed)
Called and made pt aware of recs. Nothing further needed 

## 2013-04-20 NOTE — Assessment & Plan Note (Signed)
Stay on albuterol & atrovent nebs thrice daily STOP Breo & advair Diflucan 100 mg daily x 5 days for thrush

## 2013-04-21 ENCOUNTER — Other Ambulatory Visit: Payer: Medicare Other

## 2013-04-27 ENCOUNTER — Ambulatory Visit: Payer: Medicare Other | Admitting: Adult Health

## 2013-04-30 ENCOUNTER — Telehealth: Payer: Self-pay | Admitting: Pulmonary Disease

## 2013-04-30 NOTE — Telephone Encounter (Signed)
Spoke with the pt  She states that the pt is needing something to take OTC for a runny nose  She denies any increased cough or SOB I advised to try chlorpheniramine otc  She verbalized understanding and states nothing further needed

## 2013-05-08 ENCOUNTER — Telehealth: Payer: Self-pay

## 2013-05-08 ENCOUNTER — Other Ambulatory Visit (INDEPENDENT_AMBULATORY_CARE_PROVIDER_SITE_OTHER): Payer: Medicare Other

## 2013-05-08 ENCOUNTER — Encounter: Payer: Self-pay | Admitting: Adult Health

## 2013-05-08 ENCOUNTER — Telehealth: Payer: Self-pay | Admitting: Pulmonary Disease

## 2013-05-08 ENCOUNTER — Ambulatory Visit (INDEPENDENT_AMBULATORY_CARE_PROVIDER_SITE_OTHER)
Admission: RE | Admit: 2013-05-08 | Discharge: 2013-05-08 | Disposition: A | Discharge status: Home / Self Care | Payer: Medicare Other | Source: Ambulatory Visit | Attending: Adult Health | Admitting: Adult Health

## 2013-05-08 ENCOUNTER — Ambulatory Visit (INDEPENDENT_AMBULATORY_CARE_PROVIDER_SITE_OTHER): Payer: Medicare Other | Admitting: Adult Health

## 2013-05-08 VITALS — BP 106/64 | HR 99 | Temp 97.8°F | Ht 66.0 in | Wt 121.4 lb

## 2013-05-08 DIAGNOSIS — J449 Chronic obstructive pulmonary disease, unspecified: Secondary | ICD-10-CM

## 2013-05-08 DIAGNOSIS — J189 Pneumonia, unspecified organism: Secondary | ICD-10-CM

## 2013-05-08 LAB — BASIC METABOLIC PANEL
BUN: 10 mg/dL (ref 6–23)
CHLORIDE: 97 meq/L (ref 96–112)
CO2: 29 mEq/L (ref 19–32)
Calcium: 9.2 mg/dL (ref 8.4–10.5)
Creatinine, Ser: 1 mg/dL (ref 0.4–1.2)
GFR: 60.07 mL/min (ref >60.00)
Glucose, Bld: 97 mg/dL (ref 70–99)
Potassium: 3.8 mEq/L (ref 3.5–5.1)
Sodium: 134 mEq/L — ABNORMAL LOW (ref 135–145)

## 2013-05-08 LAB — BRAIN NATRIURETIC PEPTIDE: PRO B NATRI PEPTIDE: 22 pg/mL (ref 0.0–100.0)

## 2013-05-08 MED ORDER — FUROSEMIDE 20 MG PO TABS: ORAL_TABLET | ORAL | Status: DC

## 2013-05-08 MED ORDER — DOXYCYCLINE HYCLATE 100 MG PO TABS: 100.0000 mg | ORAL_TABLET | Freq: Two times a day (BID) | ORAL | Status: DC

## 2013-05-08 NOTE — Telephone Encounter (Signed)
Pt aware of results and recs.  Nothing further needed. 

## 2013-05-08 NOTE — Telephone Encounter (Signed)
Message copied by Len Blalock on Fri May 08, 2013  4:47 PM ------      Message from: Melvenia Needles      Created: Fri May 08, 2013  2:53 PM       Chronic changes       Cont w/ ov recs       Please contact office for sooner follow up if symptoms do not improve or worsen or seek emergency care ------

## 2013-05-08 NOTE — Telephone Encounter (Signed)
Message copied by Len Blalock on Fri May 08, 2013  4:48 PM ------      Message from: Melvenia Needles      Created: Fri May 08, 2013  2:53 PM       Labs are ok       Cont w/ ov recs ------

## 2013-05-08 NOTE — Assessment & Plan Note (Signed)
Mild flare with mild vol overload  Plan  Keep legs elevated  Low salt diet  Mucinex DM Twice daily  As needed  Cough/congestion  Doxycycline 100mg  Twice daily  For 7 days -to have on hold if symptoms worsen with discolored mucus.  Stop Hydrochlorothiazide.  Begin Lasix 40mg  daily for 2 days then 20mg  daily .  Chest xray and labs today.  Please contact office for sooner follow up if symptoms do not improve or worsen or seek emergency care Follow up Dr. Elsworth Soho  In 1 month as planned and As needed

## 2013-05-08 NOTE — Progress Notes (Signed)
Subjective:    Patient ID: Shawna [name] Doctor, female    DOB: 05/07/1945, 68 y.o.   MRN: [id number]  HPI 68 year old ex smoker with COPD -gold B, RUL lobectomy in April 2013 for benign lesion (hendrickson).  Admitted 01/2012 for AECOPD and CAP.  She has been on home O2 since 08/2011 (by PCP)  Quit smoking 05/2011 but relapsed in 2014  ABGs showed compensated hypercarbia.  3/ 2013 PFTs - DLCO 50%, FEV1 1.68 - 64% (pre-op)   She presented with PET positive Lt lower lung nodule increasing in size s/p bronchoscopy 01/2013 and developed Lt PTX after procedure. This recurred x 1 but eventually resolved with chest tube placement .  Pathology results from the bronchoscopy were reported as negative for malignancy but did show atypical cells. 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 And she was started on rocephin & pred taper for COPD flare.  Additional concern of note was of on-going hyponatremia. Her urine lytes, cortisol, and Tsh were nml, HCTZ  was stopped.  PET did have positive area on left ovary bu this could not be seen on pelvic US.    04/14/13 She had persistent dyspnea & hoarseness - advair was held Pt in hosp 3 times since 11/2012  She received multiple rounds of antibiotics & steroids over past few months, ENT eval neg Chest x-ray today suggested  small left pleural effusion/ atelecatsis .  -CT confirmed Recurrent LLL atelectasis without obstructive lesion Was restarted on breo last visit   Daughter concerned about palpitations & a fibn Echo nov 2013 nml LV function She was 96 % on 2L pulse & after 2 laps on RA, desatn to 88% >stop breo/advair , Diflucan , albuterol /atrovent neb Three times a day     05/08/2013 Acute OV  Complains of chest congestion, dry cough, chest tightness, increased SOB x2 days, ankle edema L>R . Denies any f/c/s, hemoptysis, nausea, vomiting. Developed runny nose and drainage 1 week ago.  No fever, chest pain , hemoptysis, n/v/d, rash.  No recent travel or  calf pain.  No orthopnea. Sleeps on 2 pillows.  Has been taking HCTZ daily with no improvement in ankle edema.       Past Medical History  Diagnosis Date  . COPD (chronic obstructive pulmonary disease)   . HTN (hypertension)   . B12 deficiency   . OA (osteoarthritis)   . Hypokalemia   . Mass of lung   . GERD (gastroesophageal reflux disease)   . Diverticulosis of colon (without mention of hemorrhage)   . 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 Pulmonary nodule      Review of Systems  neg for any significant sore throat, dysphagia, itching, sneezing, nasal congestion or excess/ purulent secretions, fever, chills, sweats, unintended wt loss, pleuritic or exertional cp, hempoptysis, orthopnea pnd  . Also denies presyncope, palpitations, heartburn, abdominal pain, nausea, vomiting, diarrhea or change in bowel or urinary habits, dysuria,hematuria, rash, arthralgias, visual complaints, headache, numbness weakness or ataxia.      Objective:   Physical Exam   Gen. Pleasant, elderly , frail  in no distress, normal affect ENT - no lesions, no post nasal drip Neck: No JVD, no thyromegaly, no carotid bruits Lungs: no use of accessory muscles, no dullness to percussion, clear without rales or rhonchi  Cardiovascular: Rhythm regular, heart sounds  normal, no murmurs or gallops, tr-1+ peripheral edema Abdomen: soft and non-tender, no hepatosplenomegaly, BS normal. Musculoskeletal: No deformities, no cyanosis or  clubbing Neuro:  alert, non focal       Assessment & Plan:

## 2013-05-08 NOTE — Telephone Encounter (Signed)
Called spoke with pt. appt scheduled. Nothing further needed 

## 2013-05-08 NOTE — Patient Instructions (Signed)
Keep legs elevated  Low salt diet  Mucinex DM Twice daily  As needed  Cough/congestion  Doxycycline 100mg  Twice daily  For 7 days -to have on hold if symptoms worsen with discolored mucus.  Stop Hydrochlorothiazide.  Begin Lasix 40mg  daily for 2 days then 20mg  daily .  Chest xray and labs today.  Please contact office for sooner follow up if symptoms do not improve or worsen or seek emergency care Follow up Dr. Elsworth Soho  In 1 month as planned and As needed

## 2013-05-08 NOTE — Assessment & Plan Note (Signed)
Check cxr today .  

## 2013-05-11 ENCOUNTER — Telehealth: Payer: Self-pay | Admitting: Pulmonary Disease

## 2013-05-11 MED ORDER — IPRATROPIUM BROMIDE 0.02 % IN SOLN: 0.5000 mg | Freq: Four times a day (QID) | RESPIRATORY_TRACT | Status: DC

## 2013-05-11 NOTE — Telephone Encounter (Signed)
Pt aware RX has been sent. Nothing nfurther needed

## 2013-05-13 ENCOUNTER — Telehealth: Payer: Self-pay | Admitting: Pulmonary Disease

## 2013-05-13 ENCOUNTER — Ambulatory Visit: Payer: Medicare Other | Admitting: Adult Health

## 2013-05-13 NOTE — Telephone Encounter (Signed)
Pt saw TP 05/08/13: Patient Instructions      Keep legs elevated   Low salt diet   Mucinex DM Twice daily  As needed  Cough/congestion   Doxycycline 100mg  Twice daily  For 7 days -to have on hold if symptoms worsen with discolored mucus.   Stop Hydrochlorothiazide.   Begin Lasix 40mg  daily for 2 days then 20mg  daily .   Chest xray and labs today.   Please contact office for sooner follow up if symptoms do not improve or worsen or seek emergency care Follow up Dr. Elsworth Soho  In 1 month as planned and As needed    -  Called and spoke with pt. She is having a lot of coughing (dry cough), SOB is not any better, leg edema is not any better, slight wheezing, chest tx not any worse. She is taking lasix 20 mg qd now, keeping legs elevated when able, low salt diet. She did not begin the abx since she is not coughing up any mucus. She has spiriva, breo, and adviar in the home but not using. She wants to know if she should restart this any any further recs? Please advise TP thanks   Allergies  Allergen Reactions  . Aspirin Swelling    Lips and face swelled.  [name] [Oxycodone-Aspirin] Other (See Comments)    "went out of my head"

## 2013-05-13 NOTE — Telephone Encounter (Signed)
Per TP: since symptoms are not improved since the last ov, would recommend next available ov with a provider.  Thanks.

## 2013-05-13 NOTE — Telephone Encounter (Signed)
Called, spoke with pt.  Explained below per TP to pt.  She verbalized understanding. I offered OV today but pt declined; states she has no transportation today.  She did not want to schedule appt for tomorrow or Friday bc of the potential for snow.  I offered to schedule an appt on Monday or next week -- pt declined stating she will call back to schedule once she figures out what the weather is going to do.  Advised to seek emergency care if needed.  She verbalized understanding.

## 2013-06-08 ENCOUNTER — Ambulatory Visit (INDEPENDENT_AMBULATORY_CARE_PROVIDER_SITE_OTHER): Payer: Medicare Other | Admitting: Pulmonary Disease

## 2013-06-08 ENCOUNTER — Encounter: Payer: Self-pay | Admitting: Pulmonary Disease

## 2013-06-08 VITALS — BP 122/76 | HR 83 | Temp 97.5°F | Wt 121.0 lb

## 2013-06-08 DIAGNOSIS — N838 Other noninflammatory disorders of ovary, fallopian tube and broad ligament: Secondary | ICD-10-CM

## 2013-06-08 DIAGNOSIS — J449 Chronic obstructive pulmonary disease, unspecified: Secondary | ICD-10-CM

## 2013-06-08 DIAGNOSIS — R911 Solitary pulmonary nodule: Secondary | ICD-10-CM

## 2013-06-08 DIAGNOSIS — N839 Noninflammatory disorder of ovary, fallopian tube and broad ligament, unspecified: Secondary | ICD-10-CM

## 2013-06-08 NOTE — Assessment & Plan Note (Signed)
CT chest in May

## 2013-06-08 NOTE — Patient Instructions (Signed)
CT chest in May  Ambulatory satn We will provide nasal mask & humidifier for oxygen OK to use advair once daily - RINSE mouth after

## 2013-06-08 NOTE — Progress Notes (Signed)
   Subjective:    Patient ID: Shawna [name], female    DOB: 1945-04-23, 68 y.o.   MRN: [id number]  HPI  68 year old ex smoker with COPD -gold B, RUL lobectomy in April 2013 for benign lesion (hendrickson) for FU of persistent dyspnea..  Admitted 01/2012 for AECOPD and CAP.  She has been on home O2 since 08/2011 (by PCP)  Quit smoking 05/2011 but relapsed in 2014  ABGs showed compensated hypercarbia.  3/ 2013 PFTs - DLCO 50%, FEV1 1.68 - 64% (pre-op)  She presented with PET positive Lt lower lung nodule increasing in size, underwent bronchoscopy 01/2013 and developed Lt PTX after procedure.  Pathology - negative for malignancy but did show atypical cells. BAL showed M.Cat. And she was started on rocephin & pred taper for COPD flare.  Additional concern of note was of on-going hyponatremia. Her urine lytes, cortisol, and Tsh were nml, HCTZ was stopped.  PET did have positive area on left ovary bu this could not be seen on pelvic US.   Interim - She had persistent dyspnea & hoarseness - advair/ breo  was held  Pt in hosp 3 times since 11/2012  She received multiple rounds of antibiotics & steroids over past few months, ENT eval neg  CT 1/22 /15 showed Recurrent LLL atelectasis without obstructive lesion   Echo nov 2013 nml LV function    06/08/2013  05/08/2013 Acute OV - doxy  Has been taking HCTZ daily with no improvement in ankle edema-  Stopped HCTZ-->lasix  Chief Complaint  Patient presents with  . Follow-up    C/o prod cough w/ clear phlem, watery eyes, runny nose, PND, nasal congestion. Still feels SOB. using O2 more often now.    Voice better Nasal sores with oxygen Using duonebs tid, uses advair occasionally - she has 4 boxes of this Persistent dyspnea  CXR 05/08/13 LLL appears aerated   Review of Systems neg for any significant sore throat, dysphagia, itching, sneezing, nasal congestion or excess/ purulent secretions, fever, chills, sweats, unintended wt loss, pleuritic or  exertional cp, hempoptysis, orthopnea pnd or change in chronic leg swelling. Also denies presyncope, palpitations, heartburn, abdominal pain, nausea, vomiting, diarrhea or change in bowel or urinary habits, dysuria,hematuria, rash, arthralgias, visual complaints, headache, numbness weakness or ataxia.     Objective:   Physical Exam  Gen. Pleasant, well-nourished, in no distress, normal affect ENT - no lesions, no post nasal drip, no thrush Neck: No JVD, no thyromegaly, no carotid bruits Lungs: no use of accessory muscles, no dullness to percussion, clear without rales or rhonchi  Cardiovascular: Rhythm regular, heart sounds  normal, no murmurs or gallops, no peripheral edema Abdomen: soft and non-tender, no hepatosplenomegaly, BS normal. Musculoskeletal: No deformities, no cyanosis or clubbing Neuro:  alert, non focal        Assessment & Plan:

## 2013-06-08 NOTE — Assessment & Plan Note (Signed)
Ambulatory satn We will provide nasal mask & humidifier for oxygen OK to use advair once daily - RINSE mouth after Ct duonebs tid

## 2013-06-09 ENCOUNTER — Telehealth: Payer: Self-pay | Admitting: Pulmonary Disease

## 2013-06-09 NOTE — Telephone Encounter (Signed)
I called and spoke with Shawna [name]. Since pt in on liter flow for 2l/m. Not enough for pt to have nasal mask to help flush out CO2.  Pt has added humidity to O2. Only option for pt is a nasal canula per Shawna [name]. Please advise Dr. Elsworth Soho thanks

## 2013-06-09 NOTE — Telephone Encounter (Signed)
lmomtcb x1 for pt 

## 2013-06-09 NOTE — Telephone Encounter (Signed)
Called and spoke with pt. Made her aware. Nothing further needed

## 2013-06-09 NOTE — Telephone Encounter (Signed)
Pl let her know

## 2013-06-09 NOTE — Telephone Encounter (Signed)
Patient returning call.

## 2013-06-17 ENCOUNTER — Emergency Department (HOSPITAL_COMMUNITY)
Admission: EM | Admit: 2013-06-17 | Discharge: 2013-06-17 | Disposition: A | Discharge status: Home / Self Care | Payer: Medicare Other | Attending: Emergency Medicine | Admitting: Emergency Medicine

## 2013-06-17 ENCOUNTER — Emergency Department (HOSPITAL_COMMUNITY): Payer: Medicare Other

## 2013-06-17 ENCOUNTER — Encounter (HOSPITAL_COMMUNITY): Payer: Self-pay | Admitting: Emergency Medicine

## 2013-06-17 DIAGNOSIS — Z8719 Personal history of other diseases of the digestive system: Secondary | ICD-10-CM | POA: Insufficient documentation

## 2013-06-17 DIAGNOSIS — Z862 Personal history of diseases of the blood and blood-forming organs and certain disorders involving the immune mechanism: Secondary | ICD-10-CM | POA: Insufficient documentation

## 2013-06-17 DIAGNOSIS — I10 Essential (primary) hypertension: Secondary | ICD-10-CM | POA: Insufficient documentation

## 2013-06-17 DIAGNOSIS — Z8639 Personal history of other endocrine, nutritional and metabolic disease: Secondary | ICD-10-CM | POA: Insufficient documentation

## 2013-06-17 DIAGNOSIS — J441 Chronic obstructive pulmonary disease with (acute) exacerbation: Secondary | ICD-10-CM | POA: Insufficient documentation

## 2013-06-17 DIAGNOSIS — Z79899 Other long term (current) drug therapy: Secondary | ICD-10-CM | POA: Insufficient documentation

## 2013-06-17 DIAGNOSIS — Z87891 Personal history of nicotine dependence: Secondary | ICD-10-CM | POA: Insufficient documentation

## 2013-06-17 DIAGNOSIS — M1990 Unspecified osteoarthritis, unspecified site: Secondary | ICD-10-CM | POA: Insufficient documentation

## 2013-06-17 DIAGNOSIS — Z791 Long term (current) use of non-steroidal anti-inflammatories (NSAID): Secondary | ICD-10-CM | POA: Insufficient documentation

## 2013-06-17 LAB — CBC
HEMATOCRIT: 43.2 % (ref 36.0–46.0)
HEMOGLOBIN: 14.6 g/dL (ref 12.0–15.0)
MCH: 31.2 pg (ref 26.0–34.0)
MCHC: 33.8 g/dL (ref 30.0–36.0)
MCV: 92.3 fL (ref 78.0–100.0)
Platelets: 159 10*3/uL (ref 150–400)
RBC: 4.68 MIL/uL (ref 3.87–5.11)
RDW: 14.9 % (ref 11.5–15.5)
WBC: 11.5 10*3/uL — AB (ref 4.0–10.5)

## 2013-06-17 LAB — PRO B NATRIURETIC PEPTIDE: Pro B Natriuretic peptide (BNP): 348.4 pg/mL — ABNORMAL HIGH (ref 0–125)

## 2013-06-17 LAB — BASIC METABOLIC PANEL
BUN: 18 mg/dL (ref 6–23)
CHLORIDE: 98 meq/L (ref 96–112)
CO2: 28 mEq/L (ref 19–32)
Calcium: 9.5 mg/dL (ref 8.4–10.5)
Creatinine, Ser: 0.97 mg/dL (ref 0.50–1.10)
GFR calc Af Amer: 69 mL/min — ABNORMAL LOW (ref >90)
GFR, EST NON AFRICAN AMERICAN: 59 mL/min — AB (ref >90)
GLUCOSE: 95 mg/dL (ref 70–99)
POTASSIUM: 3.4 meq/L — AB (ref 3.7–5.3)
SODIUM: 142 meq/L (ref 137–147)

## 2013-06-17 LAB — I-STAT TROPONIN, ED: TROPONIN I, POC: 0.01 ng/mL (ref 0.00–0.08)

## 2013-06-17 MED ORDER — LEVOFLOXACIN 500 MG PO TABS: 500.0000 mg | ORAL_TABLET | Freq: Every day | ORAL | Status: DC

## 2013-06-17 MED ORDER — PREDNISONE 10 MG PO TABS: 20.0000 mg | ORAL_TABLET | Freq: Every day | ORAL | Status: DC

## 2013-06-17 MED ORDER — ALBUTEROL SULFATE (2.5 MG/3ML) 0.083% IN NEBU
2.5000 mg | INHALATION_SOLUTION | RESPIRATORY_TRACT | Status: DC | PRN
  Administered 2013-06-17: 2.5 mg via RESPIRATORY_TRACT
  Filled 2013-06-17: qty 3

## 2013-06-17 MED ORDER — BENZONATATE 100 MG PO CAPS: 100.0000 mg | ORAL_CAPSULE | Freq: Three times a day (TID) | ORAL | Status: DC

## 2013-06-17 MED ORDER — GUAIFENESIN ER 600 MG PO TB12: 600.0000 mg | ORAL_TABLET | Freq: Two times a day (BID) | ORAL | Status: DC | PRN

## 2013-06-17 NOTE — ED Provider Notes (Signed)
CSN: [id number]     Arrival date & time 06/17/13  4098 History   First MD Initiated Contact with Patient 06/17/13 [phone]     Chief Complaint  Patient presents with  . Shortness of Breath      HPI  Patient presents via EMS. Has history of COPD. She states her last 3 weeks she has intermittently felt a little more breathless than usual. The more of a cough over the last week. Worsening last night with frequent cough. The point she felt tightness for her chest. She slept poorly. She slept only about an hour. Called 911 this morning. His given nebulized albuterol, and IV Solu-Medrol en route. She states that from the time she left her home until she arrived here after the above treatment she was feeling much improved. Shortly after I need her to start her history and exam she states she wants to go home.  She feels congested but her cough remains nonproductive. No fevers. No peripheral edema. No other symptoms.  Past Medical History  Diagnosis Date  . COPD (chronic obstructive pulmonary disease)   . HTN (hypertension)   . B12 deficiency   . OA (osteoarthritis)   . Hypokalemia   . Mass of lung   . GERD (gastroesophageal reflux disease)   . Diverticulosis of colon (without mention of hemorrhage)   . 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 Pulmonary nodule    Past Surgical History  Procedure Laterality Date  . Carpal tunnel release      bilateral  . Tubal ligation      x 2  . Cervical spine surgery    . Lung surgery Right     per patient Dr Roxan Hockey  . Video bronchoscopy Bilateral 01/20/2013    Procedure: VIDEO BRONCHOSCOPY WITH FLUORO;  Surgeon: Rigoberto Noel, MD;  Location: [facility];  Service: Cardiopulmonary;  Laterality: Bilateral;   Family History  Problem Relation Age of Onset  . Coronary artery disease Mother 74  . Alzheimer's disease Mother   . Colon cancer     History  Substance Use Topics  . Smoking status: Former Smoker -- 1.00 packs/day for 40 years     Quit date: 06/30/2011  . Smokeless tobacco: Never Used  . Alcohol Use: No   OB History   Grav Para Term Preterm Abortions TAB SAB Ect Mult Living                 Review of Systems  Constitutional: Negative for fever, chills, diaphoresis, appetite change and fatigue.  HENT: Negative for mouth sores, sore throat and trouble swallowing.   Eyes: Negative for visual disturbance.  Respiratory: Positive for cough, chest tightness and shortness of breath. Negative for wheezing.   Cardiovascular: Negative for chest pain and leg swelling.  Gastrointestinal: Negative for nausea, vomiting, abdominal pain, diarrhea and abdominal distention.  Endocrine: Negative for polydipsia, polyphagia and polyuria.  Genitourinary: Negative for dysuria, frequency and hematuria.  Musculoskeletal: Negative for gait problem.  Skin: Negative for color change, pallor and rash.  Neurological: Negative for dizziness, syncope, light-headedness and headaches.  Hematological: Does not bruise/bleed easily.  Psychiatric/Behavioral: Negative for behavioral problems and confusion.      Allergies  Aspirin and Percodan  Home Medications   Current Outpatient Rx  Name  Route  Sig  Dispense  Refill  . acetaminophen (TYLENOL) 500 MG tablet   Oral   Take 500 mg by mouth every 6 (six) hours as needed for moderate pain.          Marland Kitchen  albuterol (PROVENTIL HFA;VENTOLIN HFA) 108 (90 BASE) MCG/ACT inhaler   Inhalation   Inhale 2 puffs into the lungs every 4 (four) hours as needed. For shortness of breath         . albuterol (PROVENTIL) (2.5 MG/3ML) 0.083% nebulizer solution   Nebulization   Take 2.5 mg by nebulization 4 (four) times daily. For shortness of breath         . ALPRAZolam (XANAX) 0.25 MG tablet   Oral   Take 0.25 mg by mouth at bedtime as needed for sleep.          . chlorpheniramine (CHLOR-TRIMETON) 4 MG tablet   Oral   Take 4 mg by mouth 2 (two) times daily as needed for allergies.         .  cyanocobalamin (,VITAMIN B-12,) 1000 MCG/ML injection   Intramuscular   Inject 1,000 mcg into the muscle every 30 (thirty) days. Given on the 10th of each month         . fluticasone (FLONASE) 50 MCG/ACT nasal spray   Each Nare   Place 2 sprays into both nostrils as needed.         . furosemide (LASIX) 20 MG tablet   Oral   Take 20-40 mg by mouth daily as needed for fluid or edema.         Marland Kitchen guaiFENesin (MUCINEX) 600 MG 12 hr tablet   Oral   Take 600 mg by mouth 2 (two) times daily.          Marland Kitchen ipratropium (ATROVENT) 0.02 % nebulizer solution   Nebulization   Take 2.5 mLs (0.5 mg total) by nebulization 4 (four) times daily. Dx 496   360 mL   3     Please deliver to pt home   . KLOR-CON M20 20 MEQ tablet   Oral   Take 20 mEq by mouth daily.          Marland Kitchen loratadine (CLARITIN) 10 MG tablet   Oral   Take 1 tablet (10 mg total) by mouth daily.   30 tablet   0   . meloxicam (MOBIC) 15 MG tablet   Oral   Take 15 mg by mouth daily.          . verapamil (CALAN-SR) 240 MG CR tablet   Oral   Take 1 tablet (240 mg total) by mouth at bedtime.   30 tablet   6   . benzonatate (TESSALON) 100 MG capsule   Oral   Take 1 capsule (100 mg total) by mouth every 8 (eight) hours.   21 capsule   0   . guaiFENesin (MUCINEX) 600 MG 12 hr tablet   Oral   Take 1 tablet (600 mg total) by mouth 2 (two) times daily as needed.   30 tablet   1   . levofloxacin (LEVAQUIN) 500 MG tablet   Oral   Take 1 tablet (500 mg total) by mouth daily.   10 tablet   0   . predniSONE (DELTASONE) 10 MG tablet   Oral   Take 2 tablets (20 mg total) by mouth daily.   10 tablet   0    BP 134/73  Pulse 93  Temp(Src) 97.6 F (36.4 C) (Oral)  Resp 22  Ht 5' 6.5" (1.689 m)  Wt 121 lb (54.885 kg)  BMI 19.24 kg/m2  SpO2 97% Physical Exam  Constitutional: She is oriented to person, place, and time. She appears well-developed and well-nourished. No distress.  HENT:  Head: Normocephalic.   Eyes: Conjunctivae are normal. Pupils are equal, round, and reactive to light. No scleral icterus.  Neck: Normal range of motion. Neck supple. No thyromegaly present.  Cardiovascular: Normal rate and regular rhythm.  Exam reveals no gallop and no friction rub.   No murmur heard. Pulmonary/Chest: Effort normal. No respiratory distress. She has wheezes in the right upper field, the right middle field, the right lower field, the left upper field, the left middle field and the left lower field. She has no rales.  Mild end expiratory wheeze, diffusely.  Abdominal: Soft. Bowel sounds are normal. She exhibits no distension. There is no tenderness. There is no rebound.  Musculoskeletal: Normal range of motion.  Neurological: She is alert and oriented to person, place, and time.  Skin: Skin is warm and dry. No rash noted.  No peripheral edema  Psychiatric: She has a normal mood and affect. Her behavior is normal.    ED Course  Procedures (including critical care time) Labs Review Labs Reviewed  CBC - Abnormal; Notable for the following:    WBC 11.5 (*)    All other components within normal limits  BASIC METABOLIC PANEL - Abnormal; Notable for the following:    Potassium 3.4 (*)    GFR calc non Af Amer 59 (*)    GFR calc Af Amer 69 (*)    All other components within normal limits  PRO B NATRIURETIC PEPTIDE - Abnormal; Notable for the following:    Pro B Natriuretic peptide (BNP) 348.4 (*)    All other components within normal limits  I-STAT TROPOININ, ED   Imaging Review Dg Chest Port 1 View  06/17/2013   CLINICAL DATA:  Short of breath, cough and congestion  EXAM: PORTABLE CHEST - 1 VIEW  COMPARISON:  DG CHEST 2 VIEW dated 05/08/2013  FINDINGS: Normal cardiac silhouette. There is improvement bilateral pleural effusions seen on prior. This persistent dense atelectasis at the right lung base. No pulmonary edema. Anterior cervical fusion noted.  IMPRESSION: 1. Resolution of small bilateral  pleural effusions. 2. Persistent right lower lobe atelectasis.   Electronically Signed   By: Suzy Bouchard M.D.   On: 06/17/2013 07:19     EKG Interpretation   Date/Time:  Wednesday June 17 2013 07:01:02 EDT Ventricular Rate:  102 PR Interval:  132 QRS Duration: 90 QT Interval:  353 QTC Calculation: 460 R Axis:   71 Text Interpretation:  Sinus tachycardia atrial enlargement Abnormal R-wave  progression, late transition Confirmed by Jeneen Rinks  MD, Marvin ([id number]) on  06/17/2013 7:24:57 AM      MDM   Final diagnoses:  COPD exacerbation    Patient had significant improvement upon her arrival from pre-hospital IV Solu-Medrol, and albuterol treatment. She states she is comfortable with her breathing and wants to go home. On exam still has a faint end expiratory wheeze. His getting additional albuterol neb. Studies are pending.  Normal troponin. Normal EKG. Chest x-ray shows no infiltrates or effusions.  Asymptomatic. Her lungs are clear after second neb. Plan is home. Antibiotics, prednisone, Mucinex, continuing her room at others. She has her albuterol and Atrovent inhalers at home. Recheck with any worsening symptoms.    Tanna Furry, MD 06/17/13 [phone]

## 2013-06-17 NOTE — ED Notes (Addendum)
Pt complaint of SOB and right chest wall pain for two days that is made worse with deep inspiration.  Pt states that her pain was a 10 prior to EMS arrival and is now a 4.  Pt given one breathing treatment of 5 mg albuterol and 125 solumedrol in route.  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  Pt on home O2 2lpm via [place]

## 2013-06-17 NOTE — ED Notes (Signed)
Pt family member provided coke.

## 2013-06-17 NOTE — Discharge Instructions (Signed)
Chronic Obstructive Pulmonary Disease Exacerbation Chronic obstructive pulmonary disease (COPD) is a common lung condition in which airflow from the lungs is limited. COPD is a general term that can be used to describe many different lung problems that limit airflow, including chronic bronchitis and emphysema. COPD exacerbations are episodes when breathing symptoms become much worse and require extra treatment. Without treatment, COPD exacerbations can be life threatening, and frequent COPD exacerbations can cause further damage to your lungs. CAUSES   Respiratory infections.   Exposure to smoke.   Exposure to air pollution, chemical fumes, or dust. Sometimes there is no apparent cause or trigger. RISK FACTORS  Smoking cigarettes.  Older age.  Frequent prior COPD exacerbations. SIGNS AND SYMPTOMS   Increased coughing.   Increased thick spit (sputum) production.   Increased wheezing.   Increased shortness of breath.   Rapid breathing.   Chest tightness. DIAGNOSIS  Your medical history, a physical exam, and tests will help your health care provider make a diagnosis. Tests may include:  A chest X-ray.  Basic lab tests.  Sputum testing.  An arterial blood gas test. TREATMENT  Depending on the severity of your COPD exacerbation, you may need to be admitted to a hospital for treatment. Some of the treatments commonly used to treat COPD exacerbations are:   Antibiotic medicines.   Bronchodilators. These are drugs that expand the air passages. They may be given with an inhaler or nebulizer. Spacer devices may be needed to help improve drug delivery.  Corticosteroid medicines.  Supplemental oxygen therapy.  HOME CARE INSTRUCTIONS   Do not smoke. Quitting smoking is very important to prevent COPD from getting worse and exacerbations from happening as often.  Avoid exposure to all substances that irritate the airway, especially to tobacco smoke.   If prescribed,  take your antibiotics as directed. Finish them even if you start to feel better.  Only take over-the-counter or prescription medicines as directed by your health care provider.It is important to use correct technique with inhaled medicines.  Drink enough fluids to keep your urine clear or pale yellow (unless you have a medical condition that requires fluid restriction).  Use a cool mist vaporizer. This makes it easier to clear your chest when you cough.   If you have a home nebulizer and oxygen, continue to use them as directed.   Maintain all necessary vaccinations to prevent infections.   Exercise regularly.   Eat a healthy diet.   Keep all follow-up appointments as directed by your health care provider. SEEK IMMEDIATE MEDICAL CARE IF:  You have worsening shortness of breath.   You have trouble talking.   You have severe chest pain.  You have blood in your sputum.  You have a fever.  You have weakness, vomit repeatedly, or faint.   You feel confused.   You continue to get worse. MAKE SURE YOU:   Understand these instructions.  Will watch your condition.  Will get help right away if you are not doing well or get worse. Document Released: 12/31/2006 Document Revised: 12/24/2012 Document Reviewed: 11/07/2012 [facility] Patient Information 2014 [place].

## 2013-07-07 ENCOUNTER — Other Ambulatory Visit (INDEPENDENT_AMBULATORY_CARE_PROVIDER_SITE_OTHER): Payer: Medicare Other

## 2013-07-07 ENCOUNTER — Telehealth: Payer: Self-pay | Admitting: Pulmonary Disease

## 2013-07-07 DIAGNOSIS — R911 Solitary pulmonary nodule: Secondary | ICD-10-CM

## 2013-07-07 LAB — BASIC METABOLIC PANEL
BUN: 16 mg/dL (ref 6–23)
CALCIUM: 9.9 mg/dL (ref 8.4–10.5)
CHLORIDE: 97 meq/L (ref 96–112)
CO2: 29 meq/L (ref 19–32)
CREATININE: 1.1 mg/dL (ref 0.4–1.2)
GFR: 55.44 mL/min — ABNORMAL LOW (ref >60.00)
Glucose, Bld: 74 mg/dL (ref 70–99)
Potassium: 3.6 mEq/L (ref 3.5–5.1)
SODIUM: 139 meq/L (ref 135–145)

## 2013-07-07 NOTE — Telephone Encounter (Signed)
Called and spoke to pt. Pt stated she had a labs ordered (BMP) from last OV with RA on 06/08/2013 and didn't get them drawn that day and wanted to come back. Pt stated she would be able to come in today. Advised pt any time today during business hours would be fine, pt stated she will come in today around lunch to get labs drawn. Pt verbalized understanding and denied any further questions or concerns at this time.

## 2013-07-13 ENCOUNTER — Ambulatory Visit: Payer: Medicare Other | Admitting: Adult Health

## 2013-07-21 ENCOUNTER — Telehealth: Payer: Self-pay | Admitting: Pulmonary Disease

## 2013-07-21 ENCOUNTER — Encounter: Payer: Self-pay | Admitting: Pulmonary Disease

## 2013-07-21 ENCOUNTER — Ambulatory Visit (INDEPENDENT_AMBULATORY_CARE_PROVIDER_SITE_OTHER): Payer: Medicare Other | Admitting: Pulmonary Disease

## 2013-07-21 VITALS — BP 118/62 | HR 76 | Wt 112.2 lb

## 2013-07-21 DIAGNOSIS — J449 Chronic obstructive pulmonary disease, unspecified: Secondary | ICD-10-CM

## 2013-07-21 DIAGNOSIS — R911 Solitary pulmonary nodule: Secondary | ICD-10-CM

## 2013-07-21 MED ORDER — ALPRAZOLAM 0.25 MG PO TABS: 0.2500 mg | ORAL_TABLET | Freq: Three times a day (TID) | ORAL | Status: DC | PRN

## 2013-07-21 MED ORDER — PREDNISONE 5 MG PO TABS: 5.0000 mg | ORAL_TABLET | Freq: Every day | ORAL | Status: DC

## 2013-07-21 NOTE — Patient Instructions (Signed)
Increase xanax to thrice daily After 10 days of 20 mg prednisone, drop to 10 mg daily x 10 days, then 5 mg daily (send in 5mg  pills #40) Ct scan next week

## 2013-07-21 NOTE — Progress Notes (Signed)
   Subjective:    Patient ID: Shawna [name], female    DOB: 04-09-1945, 68 y.o.   MRN: [id number]  HPI 68 year old ex smoker with COPD -gold B, RUL lobectomy in April 2013 for benign lesion (hendrickson) for FU of persistent dyspnea..  Admitted 01/2012 for AECOPD and CAP.  She has been on home O2 since 08/2011 (by PCP)  Quit smoking 05/2011 but relapsed in 2014  ABGs showed compensated hypercarbia.  3/ 2013 PFTs - DLCO 50%, FEV1 1.68 - 64% (pre-op)  She presented with PET positive Lt lower lung nodule increasing in size, underwent bronchoscopy 01/2013 and developed Lt PTX after procedure.  Pathology -  showed atypical cells. BAL showed M.Cat -treated  Additional concern of note was of on-going hyponatremia. Her urine lytes, cortisol, and Tsh were nml, HCTZ was stopped.  PET did have positive area on left ovary bu this could not be seen on pelvic US.  Interim -  Due to persistent dyspnea & hoarseness - advair/ breo was held  Pt in hosp 3 times since 11/2012  She received multiple rounds of antibiotics & steroids over past few months, ENT eval neg  CT 1/22 /15 showed Recurrent LLL atelectasis without obstructive lesion  Echo nov 2013 nml LV function    CXR 05/08/13 LLL appears aerated      07/21/2013  Chief Complaint  Patient presents with  . Follow-up    Pt c/p feeling very SOB. albuterol not helping. Pt on pred 10 mg 2 daily right now form PCP. Pt c/o wheezing, chest tx, dry cough. Using 2 liters O2 24/7. pt entered exam room 93% after walking from lobby to room.    ED visit 06/17/13 --. levaquin + pred Accompanied by daughter today Voice better - back on advair Persistent dyspnea -seems to have significant component of anxiety -increase in xaanx to bifd has helped  Review of Systems neg for any significant sore throat, dysphagia, itching, sneezing, nasal congestion or excess/ purulent secretions, fever, chills, sweats, unintended wt loss, pleuritic or exertional cp, hempoptysis,  orthopnea pnd or change in chronic leg swelling. Also denies presyncope, palpitations, heartburn, abdominal pain, nausea, vomiting, diarrhea or change in bowel or urinary habits, dysuria,hematuria, rash, arthralgias, visual complaints, headache, numbness weakness or ataxia.     Objective:   Physical Exam  Gen. Pleasant, thin woman, in no distress ENT - no lesions, no post nasal drip Neck: No JVD, no thyromegaly, no carotid bruits Lungs: no use of accessory muscles, no dullness to percussion, clear without rales or rhonchi  Cardiovascular: Rhythm regular, heart sounds  normal, no murmurs or gallops, no peripheral edema Musculoskeletal: No deformities, no cyanosis or clubbing        Assessment & Plan:

## 2013-07-21 NOTE — Telephone Encounter (Signed)
Pt decided to schedule an appt instead of leaving a message.  Shawna [name]

## 2013-07-23 NOTE — Assessment & Plan Note (Signed)
4 month FU Ct scan next week

## 2013-07-23 NOTE — Assessment & Plan Note (Signed)
Increase xanax to thrice daily After 10 days of 20 mg prednisone, drop to 10 mg daily x 10 days, then 5 mg daily (send in 5mg  pills #40)

## 2013-07-27 ENCOUNTER — Ambulatory Visit (HOSPITAL_COMMUNITY)
Admission: RE | Admit: 2013-07-27 | Discharge: 2013-07-27 | Disposition: A | Discharge status: Home / Self Care | Payer: Medicare Other | Source: Ambulatory Visit | Attending: Pulmonary Disease | Admitting: Pulmonary Disease

## 2013-07-27 ENCOUNTER — Other Ambulatory Visit: Payer: Self-pay | Admitting: Pulmonary Disease

## 2013-07-27 ENCOUNTER — Other Ambulatory Visit: Payer: Medicare Other

## 2013-07-27 ENCOUNTER — Encounter (HOSPITAL_COMMUNITY): Payer: Self-pay

## 2013-07-27 DIAGNOSIS — R911 Solitary pulmonary nodule: Secondary | ICD-10-CM | POA: Insufficient documentation

## 2013-07-27 MED ORDER — IOHEXOL 300 MG/ML  SOLN
75.0000 mL | Freq: Once | INTRAMUSCULAR | Status: AC | PRN
  Administered 2013-07-27: 75 mL via INTRAVENOUS

## 2013-07-28 ENCOUNTER — Other Ambulatory Visit: Payer: Self-pay | Admitting: Pulmonary Disease

## 2013-07-28 DIAGNOSIS — J449 Chronic obstructive pulmonary disease, unspecified: Secondary | ICD-10-CM

## 2013-07-30 ENCOUNTER — Ambulatory Visit (INDEPENDENT_AMBULATORY_CARE_PROVIDER_SITE_OTHER): Payer: Medicare Other | Admitting: Pulmonary Disease

## 2013-07-30 DIAGNOSIS — J449 Chronic obstructive pulmonary disease, unspecified: Secondary | ICD-10-CM

## 2013-07-30 NOTE — Progress Notes (Signed)
PFT done today. 

## 2013-08-17 ENCOUNTER — Telehealth: Payer: Self-pay | Admitting: Pulmonary Disease

## 2013-08-17 ENCOUNTER — Ambulatory Visit (INDEPENDENT_AMBULATORY_CARE_PROVIDER_SITE_OTHER): Payer: Medicare Other | Admitting: Pulmonary Disease

## 2013-08-17 ENCOUNTER — Encounter: Payer: Self-pay | Admitting: Pulmonary Disease

## 2013-08-17 VITALS — BP 110/64 | HR 102 | Temp 98.1°F | Wt 114.0 lb

## 2013-08-17 DIAGNOSIS — J449 Chronic obstructive pulmonary disease, unspecified: Secondary | ICD-10-CM

## 2013-08-17 DIAGNOSIS — I10 Essential (primary) hypertension: Secondary | ICD-10-CM

## 2013-08-17 DIAGNOSIS — R911 Solitary pulmonary nodule: Secondary | ICD-10-CM

## 2013-08-17 NOTE — Assessment & Plan Note (Signed)
We had a frank discussion about her options here She is certainly not a candidate for surgical resection. She may be a candidate for stereotactic radiotherapy. However,   Biopsy would carry extremely high risk of lung puncture and pneumothorax-note her prior episode 01/2013. I think we could reach this nodule by navigation procedure, the risks of general anesthesia would be too high. I will discuss with radiation oncology whether empiric radiation would be an option based on her prior finding of atypical cells and PET-positive nodule

## 2013-08-17 NOTE — Telephone Encounter (Signed)
Would drop her dose of verapamil to 120 given episode of syncope Pl send my note to PCP

## 2013-08-17 NOTE — Assessment & Plan Note (Signed)
She seems to have a very poor baseline, her lung function has dropped significantly since 2013 Continue current meds

## 2013-08-17 NOTE — Assessment & Plan Note (Signed)
Would drop her dose of verapamil to 120 given episode of syncope Further followup can be with PCP about this

## 2013-08-17 NOTE — Progress Notes (Signed)
   Subjective:    Patient ID: Shawna [name], female    DOB: [birth year], 68 y.o.   MRN: [id number]  HPI  68 year old ex smoker with COPD -gold B, RUL lobectomy in April 2013 for benign lesion (hendrickson) for FU of persistent dyspnea..  Admitted 01/2012 for AECOPD and CAP.  She has been on home O2 since 08/2011 (by PCP)  Quit smoking 05/2011 but relapsed in 2014  ABGs showed compensated hypercarbia.  3/ 2013 PFTs - DLCO 50%, FEV1 1.68 - 64% (pre-op)  She presented with PET positive Lt lower lung nodule increasing in size, underwent bronchoscopy 01/2013 and developed Lt PTX after procedure.  Pathology - showed atypical cells. BAL showed M.Cat -treated  Additional concern of note was of on-going hyponatremia. Her urine lytes, cortisol, and Tsh were nml, HCTZ was stopped.  PET did have positive area on left ovary bu this could not be seen on pelvic US.  Interim -  Due to persistent dyspnea & hoarseness - advair/ breo was held  Pt in hosp 3 times since 11/2012  She received multiple rounds of antibiotics & steroids over past few months, ENT eval neg  CT 1/22 /15 showed Recurrent LLL atelectasis without obstructive lesion  Echo nov 2013 nml LV function     ED visit 06/17/13 --. levaquin + pred  Persistent dyspnea -seems to have significant component of anxiety -increase in xanax to tid  helped   08/17/2013  Chief Complaint  Patient presents with  . Follow-up    Pt had PFT's done. Here to discuss next step.    CT chest 07/2013 - 1.2 x 1.2 cm irregular left lower lobe pulmonary nodule continues to progress comparing back over multiple studies to 05/10/2011. 12/25/2012 -measured 8 x 10 mm. Prednisone has been tapered to 5 mg PFTs showed severe airway obstruction with FEV1 of 0.95-37% and DLCO of 38% Daughter reports an episode of passing out - she does appear to be mildly orthostatic , heart rate increased from 76-102, pressure dropped slightly from 992 systolic to 426  Past Medical  History  Diagnosis Date  . COPD (chronic obstructive pulmonary disease)   . HTN (hypertension)   . B12 deficiency   . OA (osteoarthritis)   . Hypokalemia   . Mass of lung   . GERD (gastroesophageal reflux disease)   . Diverticulosis of colon (without mention of hemorrhage)   . 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 Pulmonary nodule      Review of Systems neg for any significant sore throat, dysphagia, itching, sneezing, nasal congestion or excess/ purulent secretions, fever, chills, sweats, unintended wt loss, pleuritic or exertional cp, hempoptysis, orthopnea pnd or change in chronic leg swelling. Also denies presyncope, palpitations, heartburn, abdominal pain, nausea, vomiting, diarrhea or change in bowel or urinary habits, dysuria,hematuria, rash, arthralgias, visual complaints, headache, numbness weakness or ataxia.     Objective:   Physical Exam  Gen. Pleasant, thin, chronically ill, in no distress, anxious affect ENT - no lesions, no post nasal drip Neck: No JVD, no thyromegaly, no carotid bruits Lungs: no use of accessory muscles, no dullness to percussion, clear without rales or rhonchi  Cardiovascular: Rhythm regular, heart sounds  normal, no murmurs or gallops, no peripheral edema Abdomen: soft and non-tender, no hepatosplenomegaly, BS normal. Musculoskeletal: No deformities, no cyanosis or clubbing Neuro:  alert, non focal       Assessment & Plan:

## 2013-08-17 NOTE — Patient Instructions (Signed)
We discussed lung nodule, poor lung function You are high risk to biopsy I will discuss option with radiation doctors

## 2013-08-18 NOTE — Telephone Encounter (Signed)
Pt is aware of recs. Note has been faxed to PCP. Nothing further needed

## 2013-08-27 ENCOUNTER — Telehealth: Payer: Self-pay | Admitting: Pulmonary Disease

## 2013-08-27 DIAGNOSIS — R911 Solitary pulmonary nodule: Secondary | ICD-10-CM

## 2013-08-27 NOTE — Telephone Encounter (Signed)
I discussed with other doctors Please make Referral  to rad oncology -dr Valere Dross

## 2013-08-27 NOTE — Telephone Encounter (Signed)
Do i need to call pt and made her aware?  Also what DX would you like me to use? thanks

## 2013-08-28 NOTE — Telephone Encounter (Signed)
I called spoke with pt. Aware of recs. Referral placed. Nothing further needed

## 2013-08-28 NOTE — Telephone Encounter (Signed)
Please do - I had told her to expect Pulmonary nodule suspicious for lung cancer

## 2013-08-31 LAB — PULMONARY FUNCTION TEST
DL/VA % pred: 45 %
DL/VA: 2.31 ml/min/mmHg/L
DLCO UNC % PRED: 38 %
DLCO UNC: 10.36 ml/min/mmHg
FEF 25-75 PRE: 0.37 L/s
FEF 25-75 Post: 0.5 L/sec
FEF2575-%Change-Post: 36 %
FEF2575-%Pred-Post: 23 %
FEF2575-%Pred-Pre: 17 %
FEV1-%CHANGE-POST: 15 %
FEV1-%PRED-POST: 37 %
FEV1-%Pred-Pre: 32 %
FEV1-PRE: 0.82 L
FEV1-Post: 0.95 L
FEV1FVC-%Change-Post: -2 %
FEV1FVC-%Pred-Pre: 53 %
FEV6-%Change-Post: 14 %
FEV6-%PRED-POST: 69 %
FEV6-%PRED-PRE: 60 %
FEV6-Post: 2.21 L
FEV6-Pre: 1.94 L
FEV6FVC-%CHANGE-POST: -3 %
FEV6FVC-%PRED-POST: 96 %
FEV6FVC-%PRED-PRE: 99 %
FVC-%CHANGE-POST: 17 %
FVC-%PRED-PRE: 61 %
FVC-%Pred-Post: 72 %
FVC-PRE: 2.03 L
FVC-Post: 2.39 L
PRE FEV6/FVC RATIO: 96 %
Post FEV1/FVC ratio: 40 %
Post FEV6/FVC ratio: 92 %
Pre FEV1/FVC ratio: 41 %
RV % PRED: 185 %
RV: 4.17 L
TLC % PRED: 120 %
TLC: 6.43 L

## 2013-08-31 NOTE — Progress Notes (Signed)
Thoracic Location of Tumor / Histology: LLL pulmonary nodule  Patient presented 3 months ago with symptoms of: persistent dyspnea, hoarseness  Biopsies of LLL (if applicable) revealed:  31/06/9700 Lung, transbronchial biopsy, LLL - BENIGN LUNG PARENCHYMA WITH ANTHRACOSIS. - THERE IS NO EVIDENCE OF MALIGNANCY. Diagnosis BRONCHIAL BRUSHING LLL, (SPECIMEN 1 OF 2, COLLECTED ON 01/20/13): ATYPICAL CELLS PRESENT. SEE COMMENT. COMMENT: THESE ATYPICAL CELLS ARE NOT QUANTITATIVELY DIAGNOSTIC OF MALIGNANCY IN MY OPINION. BRONCHIAL LAVAGE LLL, (SPECIMEN 2 OF 2, COLLECTED ON 01/20/2013). ATYPICAL CELLS PRESENT. SEE COMMENT. COMMENT: THESE ATYPICAL CELLS ARE NOT QUALITATIVELY DIAGNOSTIC OF MALIGNANCY IN MY OPINION.  Tobacco/Marijuana/Snuff/ETOH use: 1 PPD x 40 yrs, quit 05/2011, relapsed 2014  Past/Anticipated interventions by cardiothoracic surgery, if any: Not a surgical candidate  Past/Anticipated interventions by medical oncology, if any: none, no scheduled med onc appt at this time  Signs/Symptoms  Weight changes, if any: fluctuates up and down, currently she has loss of appetite  Respiratory complaints, if any: SOB, uses O 2 almost constantly, dry cough but states she feels congested  Hemoptysis, if any: no  Pain issues, if any:  "across my chest"  SAFETY ISSUES:  Prior radiation? no  Pacemaker/ICD? no  Possible current pregnancy? no  Is the patient on methotrexate? no  Current Complaints / other details:  Single, daughter with her today

## 2013-09-02 ENCOUNTER — Ambulatory Visit
Admission: RE | Admit: 2013-09-02 | Discharge: 2013-09-02 | Disposition: A | Discharge status: Home / Self Care | Payer: Medicare Other | Source: Ambulatory Visit | Attending: Radiation Oncology | Admitting: Radiation Oncology

## 2013-09-02 ENCOUNTER — Encounter: Payer: Self-pay | Admitting: Radiation Oncology

## 2013-09-02 VITALS — BP 115/82 | HR 106 | Temp 97.8°F | Resp 20 | Ht 66.5 in | Wt 111.8 lb

## 2013-09-02 DIAGNOSIS — Z79899 Other long term (current) drug therapy: Secondary | ICD-10-CM | POA: Insufficient documentation

## 2013-09-02 DIAGNOSIS — D381 Neoplasm of uncertain behavior of trachea, bronchus and lung: Secondary | ICD-10-CM | POA: Diagnosis not present

## 2013-09-02 DIAGNOSIS — Z510 Encounter for antineoplastic radiation therapy: Secondary | ICD-10-CM | POA: Insufficient documentation

## 2013-09-02 DIAGNOSIS — J4489 Other specified chronic obstructive pulmonary disease: Secondary | ICD-10-CM | POA: Insufficient documentation

## 2013-09-02 DIAGNOSIS — E538 Deficiency of other specified B group vitamins: Secondary | ICD-10-CM | POA: Insufficient documentation

## 2013-09-02 DIAGNOSIS — K219 Gastro-esophageal reflux disease without esophagitis: Secondary | ICD-10-CM | POA: Diagnosis not present

## 2013-09-02 DIAGNOSIS — Z902 Acquired absence of lung [part of]: Secondary | ICD-10-CM | POA: Diagnosis not present

## 2013-09-02 DIAGNOSIS — C3430 Malignant neoplasm of lower lobe, unspecified bronchus or lung: Secondary | ICD-10-CM | POA: Insufficient documentation

## 2013-09-02 DIAGNOSIS — R911 Solitary pulmonary nodule: Secondary | ICD-10-CM

## 2013-09-02 DIAGNOSIS — Z87891 Personal history of nicotine dependence: Secondary | ICD-10-CM | POA: Insufficient documentation

## 2013-09-02 DIAGNOSIS — J449 Chronic obstructive pulmonary disease, unspecified: Secondary | ICD-10-CM | POA: Insufficient documentation

## 2013-09-02 NOTE — Progress Notes (Signed)
Please see the Nurse Progress Note in the MD Initial Consult Encounter for this patient. 

## 2013-09-02 NOTE — Progress Notes (Signed)
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 female who is seen today through the courtesy of Dr. 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 In April 2013 she underwent a right upper lobectomy for suspected lung cancer. Pathology was benign. In October of 2014 she was being evaluated for a left pulmonary nodule. A PET scan on 01/08/2013 showed consolidation/collapse with debris in the left lower lobe bronchus suspicious for pneumonia. There was associated generalized left lower lobe hypermetabolism in the previously noted 1.0 cm left lower lobe nodule that that was not appreciated. There was also uptake within the left ovary with a SUV of 10.7. The lung SUV was 9.4. She underwent a transbronchial biopsy on 01/20/2013. Bronchial brushings showed atypical cells, not quantitatively diagnostic of malignancy. In January of 2015 she presented with worsening dyspnea and chest pain. She was found to have left lower lobe collapse/consolidation but no obstructing lesion was identified. Followup chest x-ray on 06/17/2013 showed resolution of the previously noted bilateral pleural effusions with persistent left lower lobe atelectasis. Followup CT scan on 07/27/2013 showed a 1.2 x 1.2 cm irregular nodule seen within the superior segment of left lower lobe measure 1.2 x 1.2 cm in largest from a measurement of 1.0 x 0.8 cm, retrospectively, from 12/25/2012. There was interval resolution of the left lower lobe  collapse/consolidation. Based on the previous CT scan, and interval growth, the left lower lobe nodule was felt to represent malignancy. She seen today for consideration of radiosurgery. She is on nasal O2 she does have significant dyspnea on exertion. Pulmonary function studies are remarkable for FEV1 of 0.95, 37% of predicted.  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     PAST SURGICAL HISTORY:  Past Surgical History  Procedure Laterality Date  . Carpal tunnel release      bilateral  . Tubal ligation      x 2  . Cervical spine surgery    . Lung surgery Right 07/06/11    RUL, per patient Dr Roxan Hockey  . Video bronchoscopy Bilateral 01/20/2013    Procedure: VIDEO BRONCHOSCOPY WITH FLUORO;  Surgeon: Rigoberto Noel, MD;  Location: [facility];  Service: Cardiopulmonary;  Laterality: Bilateral;     FAMILY HISTORY: family history includes Alzheimer's disease in her mother; COPD in her father; Colon cancer in an other family member; Coronary artery disease (age of onset: 52) in her mother. Mother died from cardiac disease at 17, in her father died from COPD at 25.   SOCIAL HISTORY:  reports that she quit smoking about 2 years ago. She has never used smokeless tobacco. She reports that she does not drink alcohol or use illicit drugs. Married, 3  children. She worked in a [employer] as a Engineer, maintenance.   ALLERGIES: Aspirin and Percodan   MEDICATIONS:  Current Outpatient Prescriptions  Medication Sig Dispense Refill  . acetaminophen (TYLENOL) 500 MG tablet Take 500 mg by mouth every 6 (six) hours as needed for moderate pain.       Marland Kitchen  albuterol (PROVENTIL HFA;VENTOLIN HFA) 108 (90 BASE) MCG/ACT inhaler Inhale 2 puffs into the lungs every 4 (four) hours as needed. For shortness of breath      . albuterol (PROVENTIL) (2.5 MG/3ML) 0.083% nebulizer solution Take 2.5 mg by nebulization 4 (four) times daily. For shortness of breath      . ALPRAZolam (XANAX) 0.25 MG tablet Take 1 tablet (0.25 mg total) by mouth 3 (three) times daily as needed for sleep.  90 tablet  0  . cyanocobalamin (,VITAMIN B-12,) 1000 MCG/ML injection Inject 1,000 mcg into the muscle every 30 (thirty) days. Given on the 10th of each month      . esomeprazole (NEXIUM) 20 MG capsule Take 20 mg by mouth daily at 12 noon.      . fluticasone (FLONASE) 50 MCG/ACT nasal spray Place 2 sprays into both nostrils as needed.      . furosemide (LASIX) 20 MG tablet Take 20-40 mg by mouth daily as needed for fluid or edema.      Marland Kitchen guaiFENesin (MUCINEX) 600 MG 12 hr tablet Take 1 tablet (600 mg total) by mouth 2 (two) times daily as needed.  30 tablet  1  . ipratropium (ATROVENT) 0.02 % nebulizer solution Take 2.5 mLs (0.5 mg total) by nebulization 4 (four) times daily. Dx 496  360 mL  3  . KLOR-CON M20 20 MEQ tablet Take 20 mEq by mouth daily.       Marland Kitchen loratadine (CLARITIN) 10 MG tablet Take 1 tablet (10 mg total) by mouth daily.  30 tablet  0  . meloxicam (MOBIC) 15 MG tablet Take 15 mg by mouth daily.       . verapamil (CALAN-SR) 240 MG CR tablet Take 1 tablet (240 mg total) by mouth at bedtime.  30 tablet  6   No current facility-administered medications for this encounter.     REVIEW OF SYSTEMS:  Pertinent items are noted in HPI.    PHYSICAL EXAM:  height is 5' 6.5" (1.689 m) and weight is 111 lb 12.8 oz (50.712 kg). Her oral temperature is 97.8 F (36.6 C). Her blood pressure is 115/82 and her pulse is 106. Her respiration is 20 and oxygen saturation is 100%.   Alert and oriented 68 year old white female appearing her stated age. Head and neck examination: She is on  nasal O2. Nodes: There is no palpable supraclavicular lymphadenopathy. Chest: Breath sounds distant throughout both lung zones. Heart regular rate and rhythm. Abdomen: Without hepatomegaly. Extremities: Without edema.   LABORATORY DATA:  Lab Results  Component Value Date   WBC 11.5* 06/17/2013   HGB 14.6 06/17/2013   HCT 43.2 06/17/2013   MCV 92.3 06/17/2013   PLT 159 06/17/2013   Lab Results  Component Value Date   NA 139 07/07/2013   K 3.6 07/07/2013   CL 97 07/07/2013   CO2 29 07/07/2013   Lab Results  Component Value Date   ALT 14 01/26/2013   AST 17 01/26/2013   ALKPHOS 86 01/26/2013   BILITOT 0.3 01/26/2013   FEV1 0.9 L, 37% of predicted   IMPRESSION: Clinical stage IA (T1a N0 M0) suspected non-small  cell carcinoma of the left lung. Based on her previous PET scan and most recent CT scan she appears to have a malignancy. Ideally, we like to have a tissue diagnosis, but the last time she had a biopsy she developed a pneumothorax requiring a chest tube. I would be willing to treat without a tissue diagnosis. However, I would like to repeat her PET scan just to make sure that there is no evidence for metastatic disease. Her last PET scan from October 2014 had 2 adjacent hypermetabolic foci in addition to ovarian uptake, and I would like to have confidence that we have localized disease. We discussed the potential acute and late toxicities of stereotactic radiosurgery, particularly with the location adjacent to her aorta which may require 5-10 fractions of radiosurgery. Consent is signed today. I will review her case with our physicist.   PLAN: We will proceed with a PET scan, and if she appears to have localized disease then we will get her set up for simulation/treatment planning (40 CT).  I spent 60 minutes minutes face to face with the patient and more than 50% of that time was spent in counseling and/or coordination of care.

## 2013-09-03 ENCOUNTER — Telehealth: Payer: Self-pay | Admitting: *Deleted

## 2013-09-03 NOTE — Telephone Encounter (Signed)
CALLED PATIENT TO INFORM OF TEST - FOR 09-10-13 - ARRIVAL TIME - 10:45 AM @ [facility], SPOKE WITH PATIENT AND SHE IS AWARE OF THIS TEST.

## 2013-09-10 ENCOUNTER — Ambulatory Visit (HOSPITAL_COMMUNITY)
Admission: RE | Admit: 2013-09-10 | Discharge: 2013-09-10 | Disposition: A | Discharge status: Home / Self Care | Payer: Medicare Other | Source: Ambulatory Visit | Attending: Radiation Oncology | Admitting: Radiation Oncology

## 2013-09-10 ENCOUNTER — Encounter: Payer: Self-pay | Admitting: Radiation Oncology

## 2013-09-10 DIAGNOSIS — C3430 Malignant neoplasm of lower lobe, unspecified bronchus or lung: Secondary | ICD-10-CM

## 2013-09-10 DIAGNOSIS — R222 Localized swelling, mass and lump, trunk: Secondary | ICD-10-CM | POA: Insufficient documentation

## 2013-09-10 DIAGNOSIS — J984 Other disorders of lung: Secondary | ICD-10-CM | POA: Insufficient documentation

## 2013-09-10 LAB — GLUCOSE, CAPILLARY: Glucose-Capillary: 98 mg/dL (ref 70–99)

## 2013-09-10 MED ORDER — FLUDEOXYGLUCOSE F - 18 (FDG) INJECTION
6.5000 | Freq: Once | INTRAVENOUS | Status: AC | PRN
  Administered 2013-09-10: 6.5 via INTRAVENOUS

## 2013-09-10 NOTE — Progress Notes (Signed)
Dr. [name], Dr. Kara Mead  Chart note: Shawna [name] had a PET scan earlier today. Lastly, she has 3 intensely hypermetabolic lesions along the left upper mediastinum superior to the left hilum, concerning for carcinoma. There is no soft tissue enlargement. The SUV max for all 3 lesions is 10.5. She has what appears to be N2 disease. I would like for her to be seen by Dr. Curt Bears for discussion of chemotherapy. If she wants to pursue localized radiation therapy that she would have to receive a fractionated course of treatment of 6000 cGy in 30 sessions. I spoke with the patient by phone.  Plan: I will schedule consultation with Dr. Julien Nordmann.

## 2013-09-11 ENCOUNTER — Encounter: Payer: Self-pay | Admitting: *Deleted

## 2013-09-11 NOTE — Progress Notes (Signed)
Shawna [name] Psychosocial Distress Screening Clinical Social Work  Clinical Social Work was referred by distress screening protocol.  The patient scored a 5 on the Psychosocial Distress Thermometer which indicates moderate distress. Clinical Social Worker phoned pt to assess for distress and other psychosocial needs. Pt expressed much anxiety over finances and treatment plan. She reports current bill concerns and is worried about paying for treatment. CSW discussed resources available to assist and Pt and Family Support Team as well. Pt very interested in how CSWs can assist and CSW educated pt fully on this resource. Pt aware CSW team will be available and how to contact us as needed.   ONCBCN DISTRESS SCREENING 09/02/2013  Screening Type Initial Screening  Elta [place] the number that describes how much distress you have been experiencing in the past week 5  Emotional problem type Nervousness/Anxiety;Adjusting to illness;Boredom  Information Concerns Type Lack of info about diagnosis;Lack of info about treatment  Physical Problem type Getting around;Breathing;Mouth sores/swallowing;Loss of appetitie  Other "breathing and being unable to do anything" is listed as most distressing   Clinical Social Worker follow up needed: Yes.    If yes, follow up plan: CSW to follow up at future appointments and assist with coping and resources as indicated.   Loren Racer, LCSW Clinical Social Worker [facility] Wednesday, Thursday and Friday Phone: [phone] Fax: [phone]

## 2013-09-14 ENCOUNTER — Telehealth: Payer: Self-pay | Admitting: Internal Medicine

## 2013-09-14 NOTE — Telephone Encounter (Signed)
S/W PATIENT AND GAVE NP APPT FOR 07/06 @ 1:30 W/DR. MOHAMED. APPT CONFIRMED REFERRING DR. Herbie Baltimore MURRAY DX- MALIGNANT NEOPLASM OF LOWER LOBE

## 2013-09-17 ENCOUNTER — Other Ambulatory Visit: Payer: Self-pay | Admitting: *Deleted

## 2013-09-17 DIAGNOSIS — C3430 Malignant neoplasm of lower lobe, unspecified bronchus or lung: Secondary | ICD-10-CM

## 2013-09-21 ENCOUNTER — Encounter: Payer: Self-pay | Admitting: Internal Medicine

## 2013-09-21 ENCOUNTER — Ambulatory Visit: Payer: Medicare Other

## 2013-09-21 ENCOUNTER — Other Ambulatory Visit (HOSPITAL_BASED_OUTPATIENT_CLINIC_OR_DEPARTMENT_OTHER): Payer: Medicare Other

## 2013-09-21 ENCOUNTER — Ambulatory Visit (HOSPITAL_BASED_OUTPATIENT_CLINIC_OR_DEPARTMENT_OTHER): Payer: Medicare Other | Admitting: Internal Medicine

## 2013-09-21 VITALS — BP 146/91 | HR 97 | Temp 97.4°F | Resp 17 | Ht 66.0 in | Wt 112.3 lb

## 2013-09-21 DIAGNOSIS — R911 Solitary pulmonary nodule: Secondary | ICD-10-CM

## 2013-09-21 DIAGNOSIS — R0609 Other forms of dyspnea: Secondary | ICD-10-CM

## 2013-09-21 DIAGNOSIS — C3430 Malignant neoplasm of lower lobe, unspecified bronchus or lung: Secondary | ICD-10-CM

## 2013-09-21 DIAGNOSIS — Z87891 Personal history of nicotine dependence: Secondary | ICD-10-CM

## 2013-09-21 DIAGNOSIS — R0989 Other specified symptoms and signs involving the circulatory and respiratory systems: Secondary | ICD-10-CM

## 2013-09-21 DIAGNOSIS — R059 Cough, unspecified: Secondary | ICD-10-CM

## 2013-09-21 DIAGNOSIS — R05 Cough: Secondary | ICD-10-CM

## 2013-09-21 LAB — CBC WITH DIFFERENTIAL/PLATELET
BASO%: 0.5 % (ref 0.0–2.0)
BASOS ABS: 0 10*3/uL (ref 0.0–0.1)
EOS%: 9.9 % — ABNORMAL HIGH (ref 0.0–7.0)
Eosinophils Absolute: 0.7 10*3/uL — ABNORMAL HIGH (ref 0.0–0.5)
HCT: 41.6 % (ref 34.8–46.6)
HGB: 13.9 g/dL (ref 11.6–15.9)
LYMPH%: 17.9 % (ref 14.0–49.7)
MCH: 30.1 pg (ref 25.1–34.0)
MCHC: 33.4 g/dL (ref 31.5–36.0)
MCV: 90 fL (ref 79.5–101.0)
MONO#: 0.6 10*3/uL (ref 0.1–0.9)
MONO%: 8.5 % (ref 0.0–14.0)
NEUT#: 4.7 10*3/uL (ref 1.5–6.5)
NEUT%: 63.2 % (ref 38.4–76.8)
Platelets: 119 10*3/uL — ABNORMAL LOW (ref 145–400)
RBC: 4.62 10*6/uL (ref 3.70–5.45)
RDW: 14.4 % (ref 11.2–14.5)
WBC: 7.5 10*3/uL (ref 3.9–10.3)
lymph#: 1.3 10*3/uL (ref 0.9–3.3)

## 2013-09-21 LAB — COMPREHENSIVE METABOLIC PANEL (CC13)
ALBUMIN: 3.6 g/dL (ref 3.5–5.0)
ALK PHOS: 72 U/L (ref 40–150)
ALT: 13 U/L (ref 0–55)
ANION GAP: 9 meq/L (ref 3–11)
AST: 19 U/L (ref 5–34)
BUN: 12.6 mg/dL (ref 7.0–26.0)
CO2: 30 mEq/L — ABNORMAL HIGH (ref 22–29)
Calcium: 10.1 mg/dL (ref 8.4–10.4)
Chloride: 101 mEq/L (ref 98–109)
Creatinine: 1 mg/dL (ref 0.6–1.1)
GLUCOSE: 90 mg/dL (ref 70–140)
Potassium: 3.4 mEq/L — ABNORMAL LOW (ref 3.5–5.1)
Sodium: 140 mEq/L (ref 136–145)
Total Bilirubin: 0.36 mg/dL (ref 0.20–1.20)
Total Protein: 7 g/dL (ref 6.4–8.3)

## 2013-09-21 NOTE — Progress Notes (Signed)
[facility] Telephone:([phone]) [phone]   [phone]) [phone]  CONSULT NOTE  REFERRING PHYSICIAN: Dr. Arloa Koh  REASON FOR CONSULTATION:  68 years old white female with questionable lung cancer.  HPI Shawna [name] is a 68 y.o. female was past medical history significant for COPD currently on home oxygen, hypertension, anemia, chronic gastritis, hemorrhoids and history of ovarian mass. The patient also has a long history of smoking but quit in 2013. She mentions that in early 2015 she has been complaining of worsening dyspnea and chest x-ray followed by CT scan of the chest at that time showed a spiculated 1.4 x 1.2 x 1.0 CM right upper lobe nodule. A PET scan at that time showed hypermetabolic activity in the right upper lobe nodule was no evidence of local or distant metastasis. She underwent right upper lobectomy under the care of Dr. Roxan Hockey on 07/06/2011. The final pathology showed no evidence for malignancy and there was a scattered foci of smooth muscle hyperplasia as well as anthracosis with mild interstitial fibrosis and inflammation but there was no evidence of malignancy. The patient was admitted several times recently to the hospital with recurrent pneumonia and collapse of right lung.  On 01/17/2013 the patient underwent bronchoscopy under the care Dr. Elsworth Soho. This procedure was complicated with pneumothorax. The final cytology showed atypical cells but no enough material to confirm malignancy. CT angiogram of the chest on 04/09/2013 showed left lower lobe collapse/consolidation questionable pneumonia or postobstructive but no obstructing lesion was identified on that scan. Repeat CT scan of the chest on 07/28/2013 showed a 1.2 x 1.2 CM irregular nodule in the superior segment of the left lower lobe, immediately posterior to the major fissure adjacent to the descending aorta. This was obscured by the lung consolidation on the previous study but was present on the exam  from 12/25/2012 and measured at 0.8 x 1.0 CM and has a slowly progressive since that time. The patient was referred to Dr. Valere Dross for consideration of palliative radiotherapy to this area.  He ordered a scan performed on 09/10/2013 showed C. intensely hypermetabolic lesions along the left upper mediastinum superior to the left hilum concerning for carcinoma. It was difficult to define soft tissue lesion to correspond to these hypermetabolic foci. These foci are in the same vicinity as the previously seen hypermetabolic periaortic hypermetabolic foci in nodule on CT. There was also persistent hypermetabolic activity associated with the left ovary with no increase in the size and some decreased in the metabolic activity is favored to be a benign process. Dr. Valere Dross kindly referred the patient to me today for evaluation and discussion of her treatment options. When seen today she continues to have shortness of breath at baseline and increased with exertion as well as nonproductive cough. She denied having any significant chest pain except for occasional soreness at the area of the previous surgical scar. The patient has lack of appetite and a weight loss of around 30 pounds over the last 2 years. She denied having any headache but has blurry vision. Family history significant for a mother who died from Alzheimer's and was diagnosed with cancer before she died. Her father died from complications of COPD. The patient is single and has 3 children. She was accompanied by her daughter, Shawna [name]. She used to work in the [employer], materials. She has a history of smoking one pack per day for around 45 years and she quit smoking in 2013. She has no history of alcohol drug abuse.  HPI s Past Medical History  Diagnosis Date  . COPD (chronic obstructive pulmonary disease)   . HTN (hypertension)   . B12 deficiency   . OA (osteoarthritis)   . Hypokalemia   . Mass of lung   . GERD (gastroesophageal reflux disease)   .  Diverticulosis of colon (without mention of hemorrhage)   . 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 Pulmonary nodule     Past Surgical History  Procedure Laterality Date  . Carpal tunnel release      bilateral  . Tubal ligation      x 2  . Cervical spine surgery    . Lung surgery Right 07/06/11    RUL, per patient Dr Roxan Hockey  . Video bronchoscopy Bilateral 01/20/2013    Procedure: VIDEO BRONCHOSCOPY WITH FLUORO;  Surgeon: Rigoberto Noel, MD;  Location: [facility];  Service: Cardiopulmonary;  Laterality: Bilateral;    Family History  Problem Relation Age of Onset  . Coronary artery disease Mother 69  . Alzheimer's disease Mother   . Colon cancer    . COPD Father     Social History History  Substance Use Topics  . Smoking status: Former Smoker -- 1.00 packs/day for 40 years    Quit date: 06/30/2011  . Smokeless tobacco: Never Used     Comment: resumed for a short time  . Alcohol Use: No    Allergies  Allergen Reactions  . Aspirin Swelling    Lips and face swelled.  [name] [Oxycodone-Aspirin] Other (See Comments)    "went out of my head"    Current Outpatient Prescriptions  Medication Sig Dispense Refill  . acetaminophen (TYLENOL) 500 MG tablet Take 500 mg by mouth every 6 (six) hours as needed for moderate pain.       Marland Kitchen albuterol (PROVENTIL HFA;VENTOLIN HFA) 108 (90 BASE) MCG/ACT inhaler Inhale 2 puffs into the lungs every 4 (four) hours as needed. For shortness of breath      . albuterol (PROVENTIL) (2.5 MG/3ML) 0.083% nebulizer solution Take 2.5 mg by nebulization 4 (four) times daily. For shortness of breath      . ALPRAZolam (XANAX) 0.25 MG tablet Take 1 tablet (0.25 mg total) by mouth 3 (three) times daily as needed for sleep.  90 tablet  0  . cyanocobalamin (,VITAMIN B-12,) 1000 MCG/ML injection Inject 1,000 mcg into the muscle every 30 (thirty) days. Given on the 10th of each month      . esomeprazole (NEXIUM) 20 MG capsule Take 20 mg by  mouth daily at 12 noon.      . fluticasone (FLONASE) 50 MCG/ACT nasal spray Place 2 sprays into both nostrils as needed.      . furosemide (LASIX) 20 MG tablet Take 20-40 mg by mouth daily as needed for fluid or edema.      Marland Kitchen guaiFENesin (MUCINEX) 600 MG 12 hr tablet Take 1 tablet (600 mg total) by mouth 2 (two) times daily as needed.  30 tablet  1  . ipratropium (ATROVENT) 0.02 % nebulizer solution Take 2.5 mLs (0.5 mg total) by nebulization 4 (four) times daily. Dx 496  360 mL  3  . KLOR-CON M20 20 MEQ tablet Take 20 mEq by mouth daily.       Marland Kitchen loratadine (CLARITIN) 10 MG tablet Take 1 tablet (10 mg total) by mouth daily.  30 tablet  0  . meloxicam (MOBIC) 15 MG tablet Take 15 mg by mouth daily.       Marland Kitchen  verapamil (CALAN-SR) 240 MG CR tablet Take 1 tablet (240 mg total) by mouth at bedtime.  30 tablet  6   No current facility-administered medications for this visit.    Review of Systems  Constitutional: positive for anorexia, fatigue and weight loss Eyes: negative Ears, nose, mouth, throat, and face: negative Respiratory: positive for cough and dyspnea on exertion Cardiovascular: negative Gastrointestinal: negative Genitourinary:negative Integument/breast: negative Hematologic/lymphatic: negative Musculoskeletal:negative Neurological: negative Behavioral/Psych: negative Endocrine: negative Allergic/Immunologic: negative  Physical Exam  NUU:VOZDG, healthy, no distress, well nourished and well developed SKIN: skin color, texture, turgor are normal, no rashes or significant lesions HEAD: Normocephalic, No masses, lesions, tenderness or abnormalities EYES: normal, PERRLA EARS: External ears normal, Canals clear OROPHARYNX:no exudate, no erythema and lips, buccal mucosa, and tongue normal  NECK: supple, no adenopathy, no JVD LYMPH:  no palpable lymphadenopathy, no hepatosplenomegaly BREAST:not examined LUNGS: expiratory wheezes bilaterally HEART: regular rate & rhythm and no  murmurs ABDOMEN:abdomen soft, non-tender, normal bowel sounds and no masses or organomegaly BACK: Back symmetric, no curvature., No CVA tenderness EXTREMITIES:no joint deformities, effusion, or inflammation, no edema, no skin discoloration, no clubbing  NEURO: alert & oriented x 3 with fluent speech, no focal motor/sensory deficits  PERFORMANCE STATUS: ECOG 1- 2  LABORATORY DATA: Lab Results  Component Value Date   WBC 7.5 09/21/2013   HGB 13.9 09/21/2013   HCT 41.6 09/21/2013   MCV 90.0 09/21/2013   PLT 119 Large platelets present* 09/21/2013      Chemistry      Component Value Date/Time   NA 140 09/21/2013 1333   NA 139 07/07/2013 1213   K 3.4* 09/21/2013 1333   K 3.6 07/07/2013 1213   CL 97 07/07/2013 1213   CO2 30* 09/21/2013 1333   CO2 29 07/07/2013 1213   BUN 12.6 09/21/2013 1333   BUN 16 07/07/2013 1213   CREATININE 1.0 09/21/2013 1333   CREATININE 1.1 07/07/2013 1213      Component Value Date/Time   CALCIUM 10.1 09/21/2013 1333   CALCIUM 9.9 07/07/2013 1213   ALKPHOS 72 09/21/2013 1333   ALKPHOS 86 01/26/2013 0710   AST 19 09/21/2013 1333   AST 17 01/26/2013 0710   ALT 13 09/21/2013 1333   ALT 14 01/26/2013 0710   BILITOT 0.36 09/21/2013 1333   BILITOT 0.3 01/26/2013 0710       RADIOGRAPHIC STUDIES: Nm Pet Image Restag (ps) Skull Base To Thigh  09/10/2013   CLINICAL DATA:  Subsequent treatment strategy for lung mass all.  EXAM: NUCLEAR MEDICINE PET SKULL BASE TO THIGH  TECHNIQUE: 6.5 mCi F-18 FDG was injected intravenously. Full-ring PET imaging was performed from the skull base to thigh after the radiotracer. CT data was obtained and used for attenuation correction and anatomic localization.  FASTING BLOOD GLUCOSE:  Value: 99 mg/dl  COMPARISON:  CT 07/27/2013, PET-CT 01/08/2013, 05/22/2011  FINDINGS: NECK  No hypermetabolic lymph nodes in the neck.  CHEST  There are 3 intensely hypermetabolic foci along the left suprahilar mediastinum. Thesediscrete foci of metabolic activity are intense with  max equal 10 5. Difficult to define the corresponding soft tissue/ node / tumor lesions associated with these hypermetabolic foci. Lesions are in the vicinity of the suspicious nodule within the left lower lobe seen on CT of 07/27/2013 as well as appearing seen hypermetabolic nodular foci on prior PET-CT. The esophagus swings towards the AP windows in this patient but is not involved by these lesions.  There is interval resolution of the pneumonitis seen  in the left lower lobe on comparison exam.  No additional hypermetabolic nodules within the left right lung. No hypermetabolic supraclavicular adenopathy.  ABDOMEN/PELVIS  There is no abnormal metabolic activity in the liver. The adrenal glands are normal. There is persistent focus of hypermetabolic activity associated with the presumed left ovary. This lesion is small measuring approximately 13 mm unchanged from 14 mm on prior. Metabolic activity is slightly decreased with SUV max equals 7.0 decreased from 10.0.  SKELETON  No focal hypermetabolic activity to suggest skeletal metastasis.  IMPRESSION: 1. Three intensely hypermetabolic lesions along the left upper mediastinum superior to the left hilum are concerning for carcinoma. Difficult to define soft tissue lesions to correspond these hypermetabolic foci. These foci are in the same vicinity as previously seen hypermetabolic periaortic hypermetabolic foci and nodule on CT. 2. Persistent hypermetabolic activity associated with the left ovary with no increase in size and some decrease in metabolic activity is favored to be a benign process per   Electronically Signed   By: Suzy Bouchard M.D.   On: 09/10/2013 14:29    ASSESSMENT: This is a very pleasant 68 years old white female is questionable lung cancer presented with hypermetabolic lesions along the left upper mediastinum and superior to the left hilum. Her previous bronchoscopy performed in November of 2014 showed atypical cells but not enough evidence  for malignancy.  PLAN: I had a lengthy discussion with the patient and her daughter today about her current status and treatment options. I explained to the patient that I would not be able to provide her with systemic chemotherapy unless I have clear diagnosis of malignancy. I will present her case to the weekly thoracic contrast for discussion and to see the best option for tissue diagnosis. The patient had atypical cells on the previous bronchoscopy and the lesions are hypermetabolic on the PET scan but I am not sure if this will be enough for Dr. Valere Dross to offer her palliative radiotherapy to the left hilar region. I did not give the patient a followup appointment until we discussed her case at the thoracic conference. I gave the patient and her daughter the time to ask questions and I answered them completely to their satisfaction.  The patient voices understanding of current disease status and treatment options and is in agreement with the current care plan.  All questions were answered. The patient knows to call the clinic with any problems, questions or concerns. We can certainly see the patient much sooner if necessary.  Thank you so much for allowing me to participate in the care of Shawna [name]. I will continue to follow up the patient with you and assist in her care.  I spent 40 minutes counseling the patient face to face. The total time spent in the appointment was 60 minutes.  Disclaimer: This note was dictated with voice recognition software. Similar sounding words can inadvertently be transcribed and may not be corrected upon review.   Frona Yost K. 09/21/2013, 3:32 PM

## 2013-09-24 ENCOUNTER — Telehealth: Payer: Self-pay | Admitting: *Deleted

## 2013-09-24 NOTE — Telephone Encounter (Signed)
Called pt with update from thoracic conference per Dr. Worthy Flank request.  I let her know that Dr. Valere Dross will be notified of conference update and pt will be scheduled to see him.  She verbalized understanding.

## 2013-09-26 ENCOUNTER — Encounter: Payer: Self-pay | Admitting: Radiation Oncology

## 2013-09-26 NOTE — Progress Notes (Signed)
CC: Dr. Kara Mead, Dr. Curt Bears, Dr. [name]   Chart note: Shawna [name] was presented at the multidisciplinary thoracic oncology conference , and we are not totally convinced that she has an underlying malignancy even though her PET scan is worrisome. Dr. Julien Nordmann is not willing to proceed with chemotherapy without a tissue diagnosis. I spoke with the patient this morning and told her that I think it would be reasonable to simply repeat a CT scan of the chest in approximately 3 months to see if there is a growing mass which would give me an indication to proceed with radiation therapy as without a tissue diagnosis. I defer to Dr. Elsworth Soho. She is at high-risk for any biopsy. She understands the dilemma. She'll maintain her followup with Dr. Elsworth Soho, and she will see his PA on August 3. If there is the development of a mass on subsequent CT scans, then I would be more than happy to see her again.

## 2013-10-07 ENCOUNTER — Other Ambulatory Visit: Payer: Self-pay | Admitting: *Deleted

## 2013-10-07 MED ORDER — IPRATROPIUM BROMIDE 0.02 % IN SOLN
0.5000 mg | Freq: Four times a day (QID) | RESPIRATORY_TRACT | Status: DC
Start: 2013-10-07 — End: 2014-08-05

## 2013-10-19 ENCOUNTER — Encounter: Payer: Self-pay | Admitting: Adult Health

## 2013-10-19 ENCOUNTER — Ambulatory Visit (INDEPENDENT_AMBULATORY_CARE_PROVIDER_SITE_OTHER): Payer: Medicare Other | Admitting: Adult Health

## 2013-10-19 VITALS — BP 130/80 | HR 81 | Temp 97.7°F | Ht 66.5 in | Wt 111.4 lb

## 2013-10-19 DIAGNOSIS — J449 Chronic obstructive pulmonary disease, unspecified: Secondary | ICD-10-CM

## 2013-10-19 DIAGNOSIS — R918 Other nonspecific abnormal finding of lung field: Secondary | ICD-10-CM

## 2013-10-19 DIAGNOSIS — J4489 Other specified chronic obstructive pulmonary disease: Secondary | ICD-10-CM

## 2013-10-19 DIAGNOSIS — R911 Solitary pulmonary nodule: Secondary | ICD-10-CM

## 2013-10-19 NOTE — Patient Instructions (Signed)
Continue on Advair 1 puff daily  Continue on Duoneb Four times a day    (Albuterol and Ipratropium )  Continue on Oxygen 2l/m  We are setting you up for your CT chest next week to follow lung nodule.  Follow up Dr. Elsworth Soho  In 3-4 months and As needed

## 2013-10-20 NOTE — Progress Notes (Signed)
Subjective:    Patient ID: Rosalie Doctor, female    DOB: October 08, 1945, 68 y.o.   MRN: [id number]  HPI   68 year old ex smoker with COPD -gold B, RUL lobectomy in April 2013 for benign lesion (hendrickson) for FU of persistent dyspnea..  Admitted 01/2012 for AECOPD and CAP.  She has been on home O2 since 08/2011 (by PCP)  Quit smoking 05/2011 but relapsed in 2014  ABGs showed compensated hypercarbia.  3/ 2013 PFTs - DLCO 50%, FEV1 1.68 - 64% (pre-op)  She presented with PET positive Lt lower lung nodule increasing in size, underwent bronchoscopy 01/2013 and developed Lt PTX after procedure.  Pathology - showed atypical cells. BAL showed M.Cat -treated  Additional concern of note was of on-going hyponatremia. Her urine lytes, cortisol, and Tsh were nml, HCTZ was stopped.  PET did have positive area on left ovary bu this could not be seen on pelvic US.  Interim -  Due to persistent dyspnea & hoarseness - advair/ breo was held  Pt in hosp 3 times since 11/2012  She received multiple rounds of antibiotics & steroids over past few months, ENT eval neg  CT 1/22 /15 showed Recurrent LLL atelectasis without obstructive lesion  Echo nov 2013 nml LV function     ED visit 06/17/13 --. levaquin + pred  Persistent dyspnea -seems to have significant component of anxiety -increase in xanax to tid  helped   08/17/13  CT chest 07/2013 - 1.2 x 1.2 cm irregular left lower lobe pulmonary nodule continues to progress comparing back over multiple studies to 05/10/2011. 12/25/2012 -measured 8 x 10 mm. Prednisone has been tapered to 5 mg PFTs showed severe airway obstruction with FEV1 of 0.95-37% and DLCO of 38% Daughter reports an episode of passing out - she does appear to be mildly orthostatic , heart rate increased from 76-102, pressure dropped slightly from 182 systolic to 993  09/17/67 Follow up COPD and Lung Nodule.  Says her breathing has good and bad day. Feels like at times it is getting worse. Gets  winded walking far. Had intermittent dry cough. No fever, discolored mucus or fever.  Had worse cough and dyspnea couple of weeks ago, took left over prednisone that helped w/ return to her baseline. Remains on Advair Twice daily and Duoneb Four times a day  .  Denies chest pain, orthopnea, edema or hemoptysis.   Pt has LLL lung nodule w/ most recent CT 07/2013 1.2x1.2 cm,  (prev FOB 01/2013 showed atypical cells). Seen by Dr. Valere Dross Radiation Oncology , w/ repeat +PET along the Left upper mediastinus superior to the left hilum . See by Dr. Inda Merlin 7/6 w/ plans to follow nodule serially as we do not have tissue dx to proceede with chemo. Case was discussed at thoracic oncology conference, plans to repeat CT this month to check for growth and then decide on radiation therapy per Dr. Valere Dross.       Past Medical History  Diagnosis Date  . COPD (chronic obstructive pulmonary disease)   . HTN (hypertension)   . B12 deficiency   . OA (osteoarthritis)   . Hypokalemia   . Mass of lung   . GERD (gastroesophageal reflux disease)   . Diverticulosis of colon (without mention of hemorrhage)   . 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 Pulmonary nodule      Review of Systems  neg for any significant sore throat, dysphagia, itching, sneezing, nasal congestion or excess/ purulent  secretions, fever, chills, sweats, unintended wt loss, pleuritic or exertional cp, hempoptysis, orthopnea pnd or change in chronic leg swelling. Also denies presyncope, palpitations, heartburn, abdominal pain, nausea, vomiting, diarrhea or change in bowel or urinary habits, dysuria,hematuria, rash, arthralgias, visual complaints, headache, numbness weakness or ataxia.     Objective:   Physical Exam   Gen. Pleasant, thin, chronically ill, in no distress, anxious affect ENT - no lesions, no post nasal drip Neck: No JVD, no thyromegaly, no carotid bruits Lungs: no use of accessory muscles, no dullness to  percussion, decreased bs in bases, no wheezing  Cardiovascular: Rhythm regular, heart sounds  normal, no murmurs or gallops, no peripheral edema Abdomen: soft and non-tender, no hepatosplenomegaly, BS normal. Musculoskeletal: No deformities, no cyanosis or clubbing Neuro:  alert, non focal       Assessment & Plan:

## 2013-10-26 NOTE — Assessment & Plan Note (Signed)
Compensated on present regimen   Plan  Continue on Advair 1 puff daily  Continue on Duoneb Four times a day    (Albuterol and Ipratropium )  Continue on Oxygen 2l/m  Follow up Dr. Elsworth Soho  In 3-4 months and As needed

## 2013-10-26 NOTE — Assessment & Plan Note (Addendum)
PET + , atypical cells on FOB, no tissue dx  Cont to follow lung nodule on CT serially. Seen by Oncology , no tissue dx so not candidate for chemo  If increases in size will need referral back to Radiation Oncology , Dr. Valere Dross to decide on XRT    Plan  We are setting you up for your CT chest next week to follow lung nodule.  Follow up Dr. Elsworth Soho  In 3-4 months and As needed

## 2013-10-29 ENCOUNTER — Ambulatory Visit (INDEPENDENT_AMBULATORY_CARE_PROVIDER_SITE_OTHER)
Admission: RE | Admit: 2013-10-29 | Discharge: 2013-10-29 | Disposition: A | Discharge status: Home / Self Care | Payer: Medicare Other | Source: Ambulatory Visit | Attending: Adult Health | Admitting: Adult Health

## 2013-10-29 DIAGNOSIS — R918 Other nonspecific abnormal finding of lung field: Secondary | ICD-10-CM

## 2013-10-30 ENCOUNTER — Encounter: Payer: Self-pay | Admitting: Adult Health

## 2013-10-30 ENCOUNTER — Other Ambulatory Visit: Payer: Self-pay | Admitting: Adult Health

## 2013-10-30 DIAGNOSIS — R911 Solitary pulmonary nodule: Secondary | ICD-10-CM

## 2013-10-30 NOTE — Progress Notes (Signed)
Quick Note:  Called spoke with patient, advised of CT results / recs as stated by TP. Pt verbalized her understanding and denied any questions. Orders only encounter created for the CT scan in December (pt is due for follow up w/ RA at that time). ______

## 2013-10-30 NOTE — Progress Notes (Signed)
Result Notes    Notes Recorded by Rinaldo Ratel, CMA on 10/30/2013 at 5:12 PM Called spoke with patient, advised of CT results / recs as stated by TP. Pt verbalized her understanding and denied any questions. Orders only encounter created for the CT scan in December (pt is due for follow up w/ RA at that time). ------  Notes Recorded by Melvenia Needles, NP on 10/30/2013 at 5:04 PM LLL nodule w/ no change in growth rec repeat in 4 months with follow up with Dr. Elsworth Soho CT chest w/o contrast.

## 2013-11-05 ENCOUNTER — Other Ambulatory Visit: Payer: Medicare Other

## 2013-11-17 IMAGING — CR DG CHEST 2V
2 series · 2 of 2 positions shown · non-contrast
Comparison: 07/12/2011

CLINICAL DATA: Nonproductive cough.  1 week postop from right upper
lobectomy for lung carcinoma.

CHEST - 2 VIEW

[w chest pa]
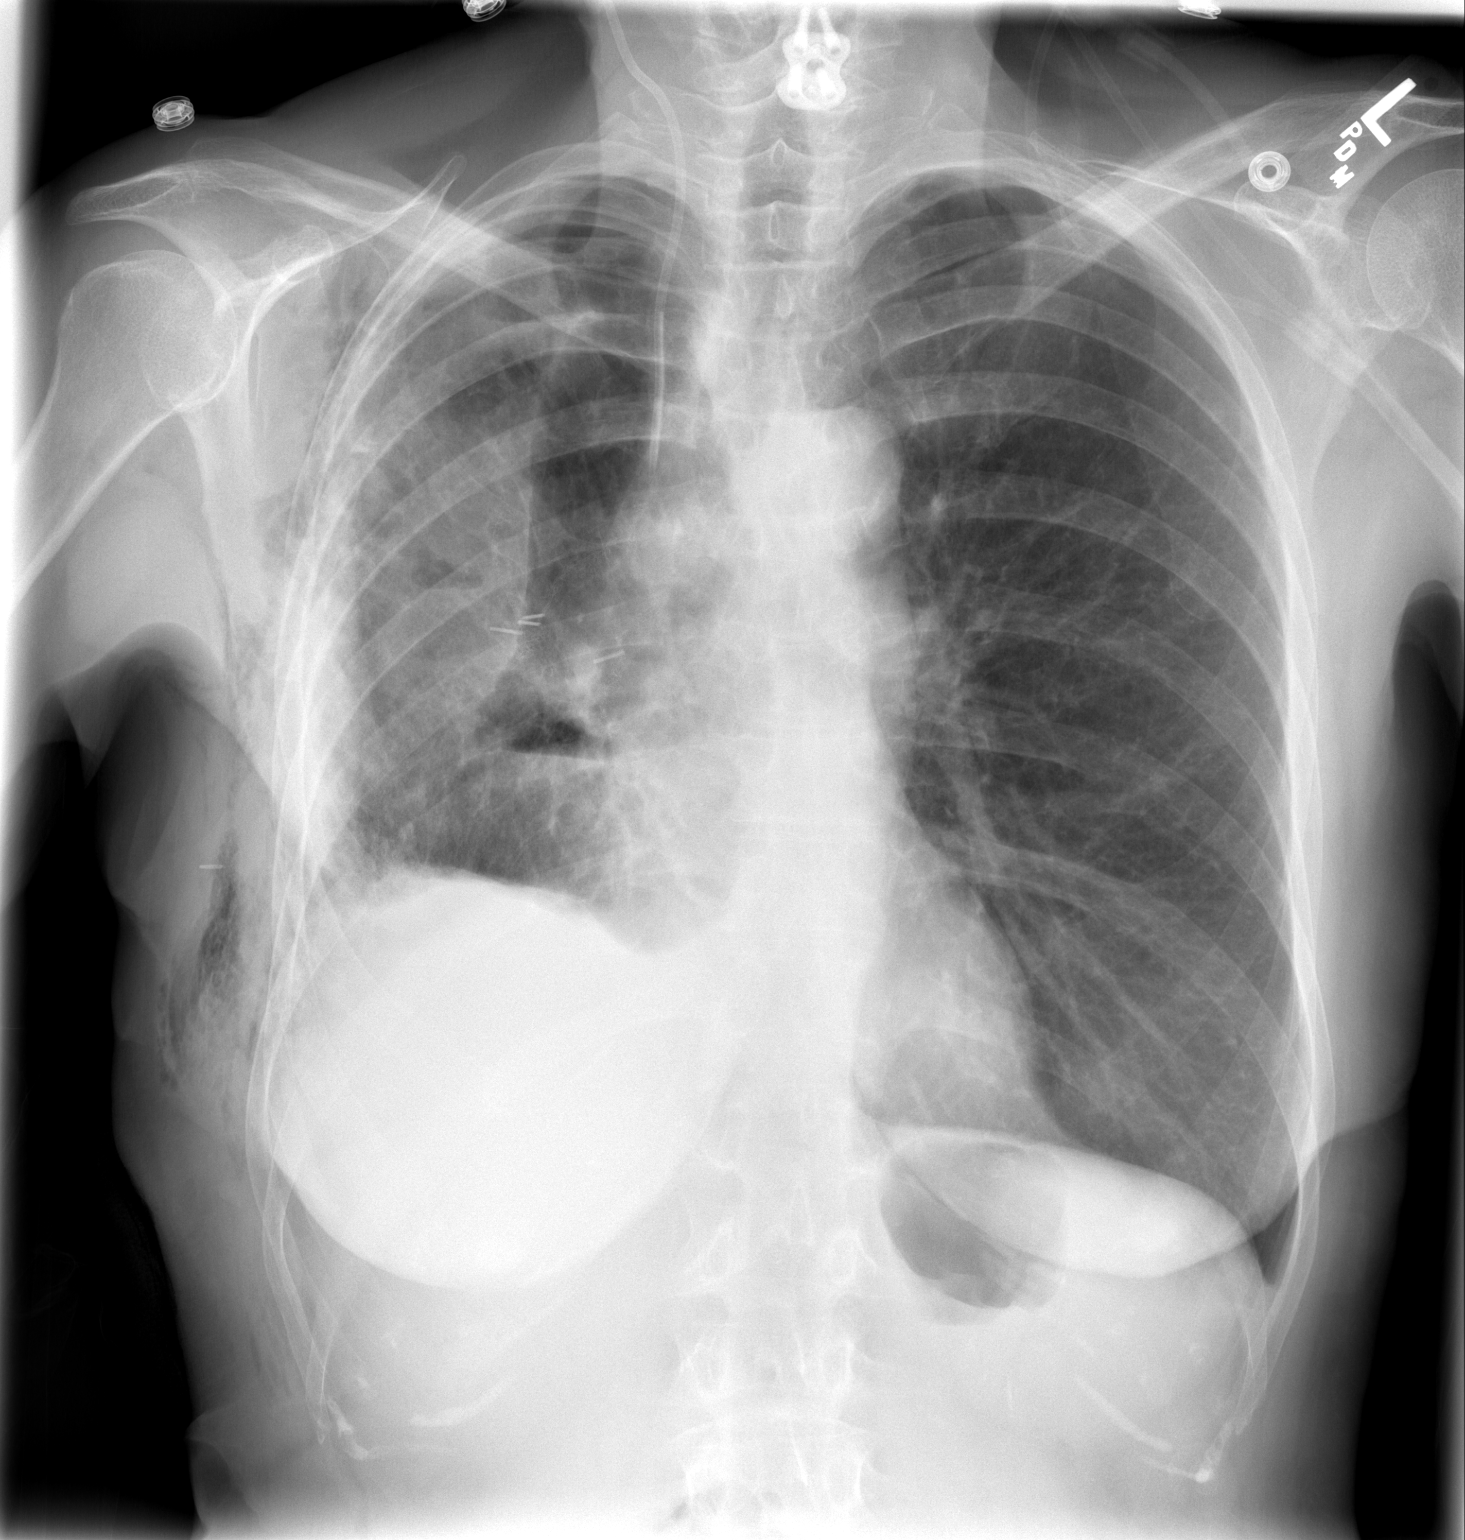

[w chest lat]
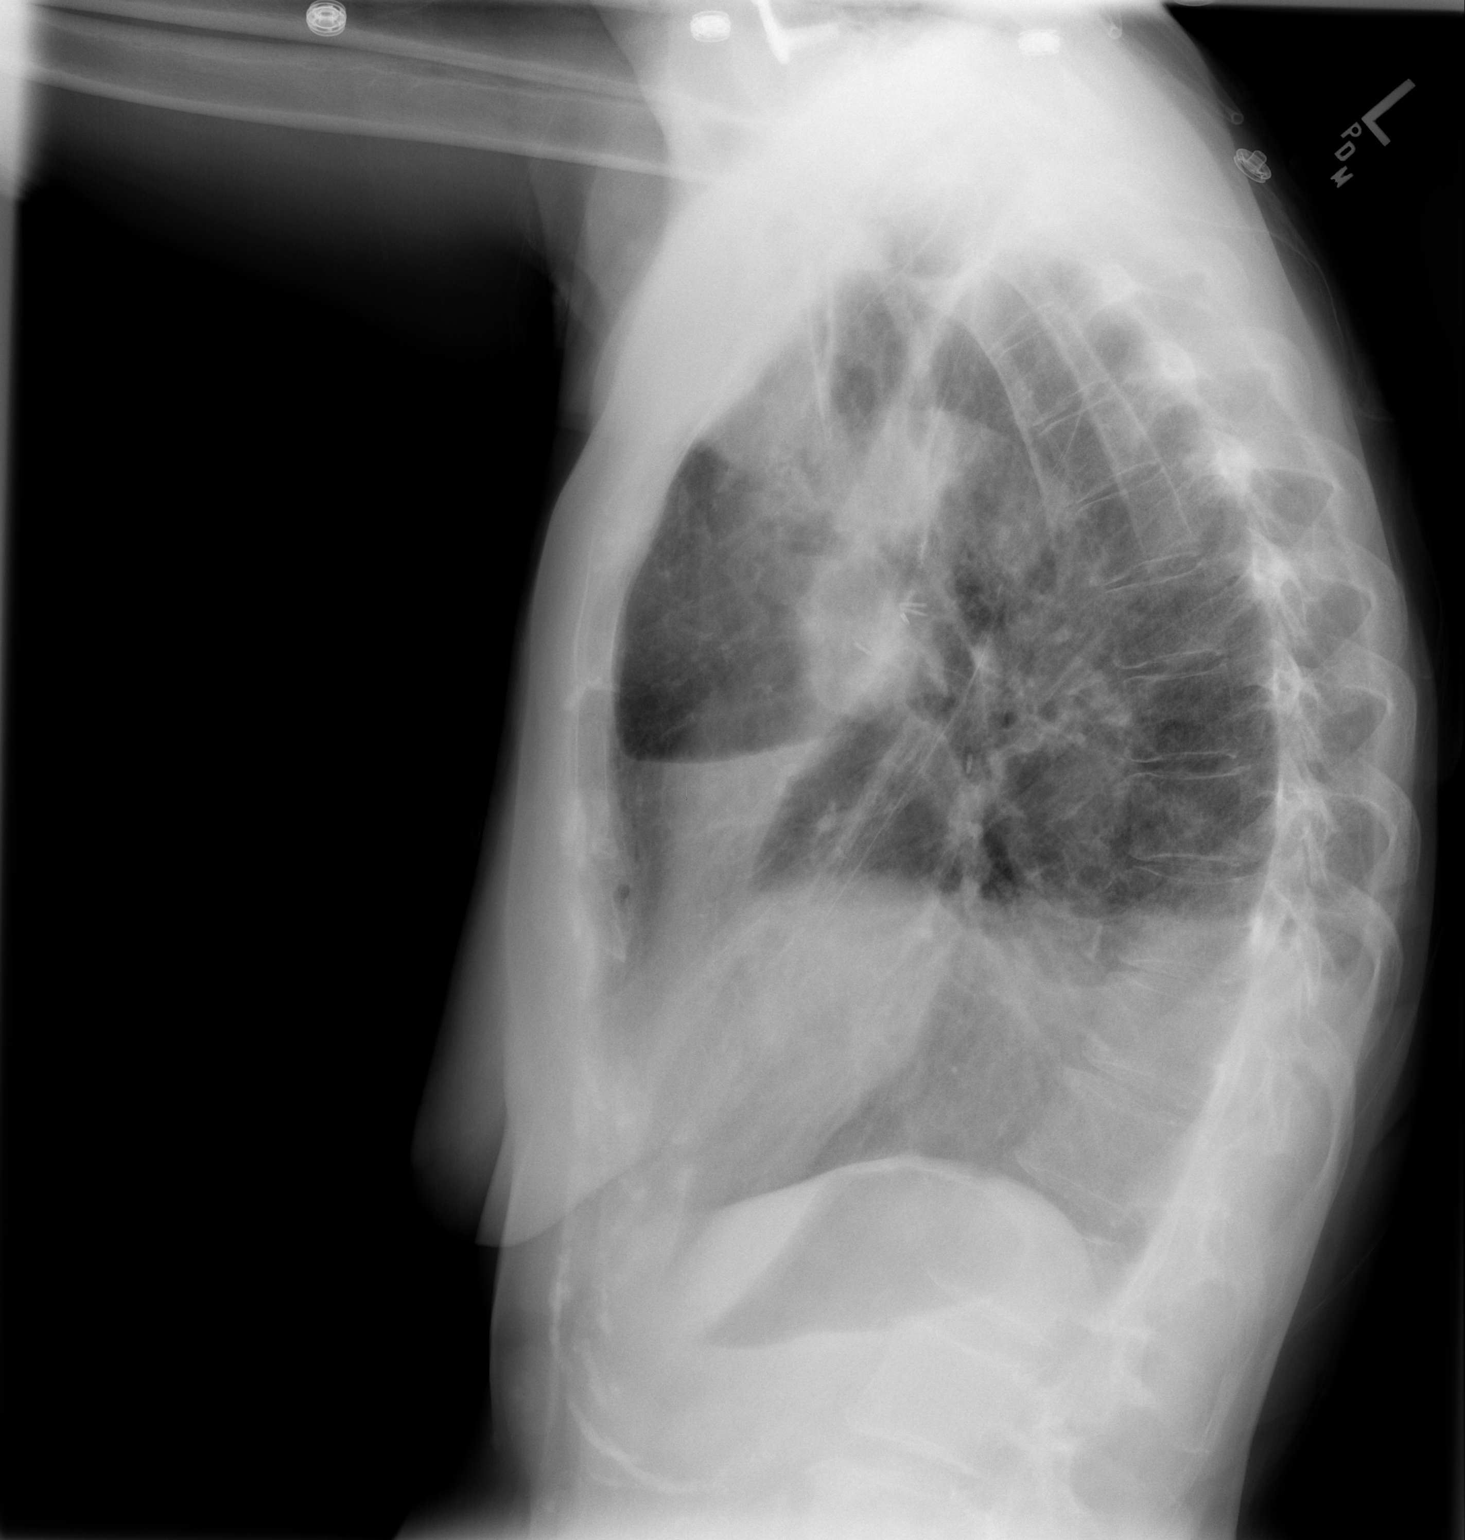

[2 of 2 positions shown; findings below may reference images not displayed]

FINDINGS: Postop changes are seen from the right upper lobectomy.
Tiny 5% right apical pneumothorax appears mildly decreased in size.
A probable loculated hydropneumothorax is seen in the anterior
right hemithorax.  Mild right lateral pleural thickening or fluid
is unchanged, as well subcutaneous emphysema in the right chest
wall.

Left lung remains clear.  Heart size is normal.  Right jugular
center venous catheter remains in appropriate position.
IMPRESSION: 1.  Mild decrease in tiny 5% right apical pneumothorax.
2.  Probable loculated hydropneumothorax now seen along the right
anterior chest wall.a

## 2013-11-18 ENCOUNTER — Telehealth: Payer: Self-pay | Admitting: Pulmonary Disease

## 2013-11-18 MED ORDER — PREDNISONE 10 MG PO TABS: ORAL_TABLET | ORAL | Status: DC

## 2013-11-18 NOTE — Telephone Encounter (Signed)
?   Cough or wheezing Can see tomorrow to evaluate.  Please contact office for sooner follow up if symptoms do not improve or worsen or seek emergency care

## 2013-11-18 NOTE — Telephone Encounter (Signed)
Called spoke with patient and discussed TP's recs with her.  Again offered appt and stressed the importance of coming in so that we may determine where the dyspnea is coming from.  Again, pt denied appt at this time.  Advised pt of pred taper being sent to pharmacy and to call the office ASAP if her dyspnea does not improve or it worsens.  Pt verbalized her understanding and denied any questions/concerns at this time.  Nothing further needed at this time; will sign off.

## 2013-11-18 NOTE — Telephone Encounter (Signed)
Called spoke with pt. Made her aware have not received anything from her eye doctor. She will cal them. She reports she is also having increased SOB x yesterday. Reports she is SOB all the time, regardless the situation. She has pred 5 mg at home and wants to if okay to take this? If so how can she take this? She doesn't;t want her breathing to get worse before her eye surgery on 12/01/13. Please advise TP as RA on vacation. Thanks  Allergies  Allergen Reactions  . Aspirin Swelling    Lips and face swelled.  [name] [Oxycodone-Aspirin] Other (See Comments)    "went out of my head"

## 2013-11-18 NOTE — Addendum Note (Signed)
Addended by: Inge Rise on: 11/18/2013 04:49 PM   Modules accepted: Orders

## 2013-11-18 NOTE — Telephone Encounter (Signed)
Called spoke w/ pt. She reports she is not able to come in and can't afford it either. She is not wheezing, no chest tx, very slight dry cough.  Please advise TP thanks

## 2013-11-18 NOTE — Telephone Encounter (Signed)
Discussed with TP: our recommendation is that pt comes in for ov.  Since she is unable to do so, may have pred taper 10mg  #20, 4x2, 3x2, 2x2, 1x2 and stop - she should be done with this in time for her eye surgery.  If her dyspnea worsens, MUST GO TO ER or call the office for ASAP appt.  Thanks.  Rx already sent if triage wouldn't mind calling pt.  Thanks!

## 2013-11-19 ENCOUNTER — Encounter (HOSPITAL_COMMUNITY): Payer: Self-pay | Admitting: Pharmacy Technician

## 2013-11-19 IMAGING — CR DG CHEST 2V
2 series · 2 of 2 positions shown · non-contrast
Comparison: 07/12/1937

CLINICAL DATA: Status post right upper lobectomy

CHEST - 2 VIEW

[w chest pa]
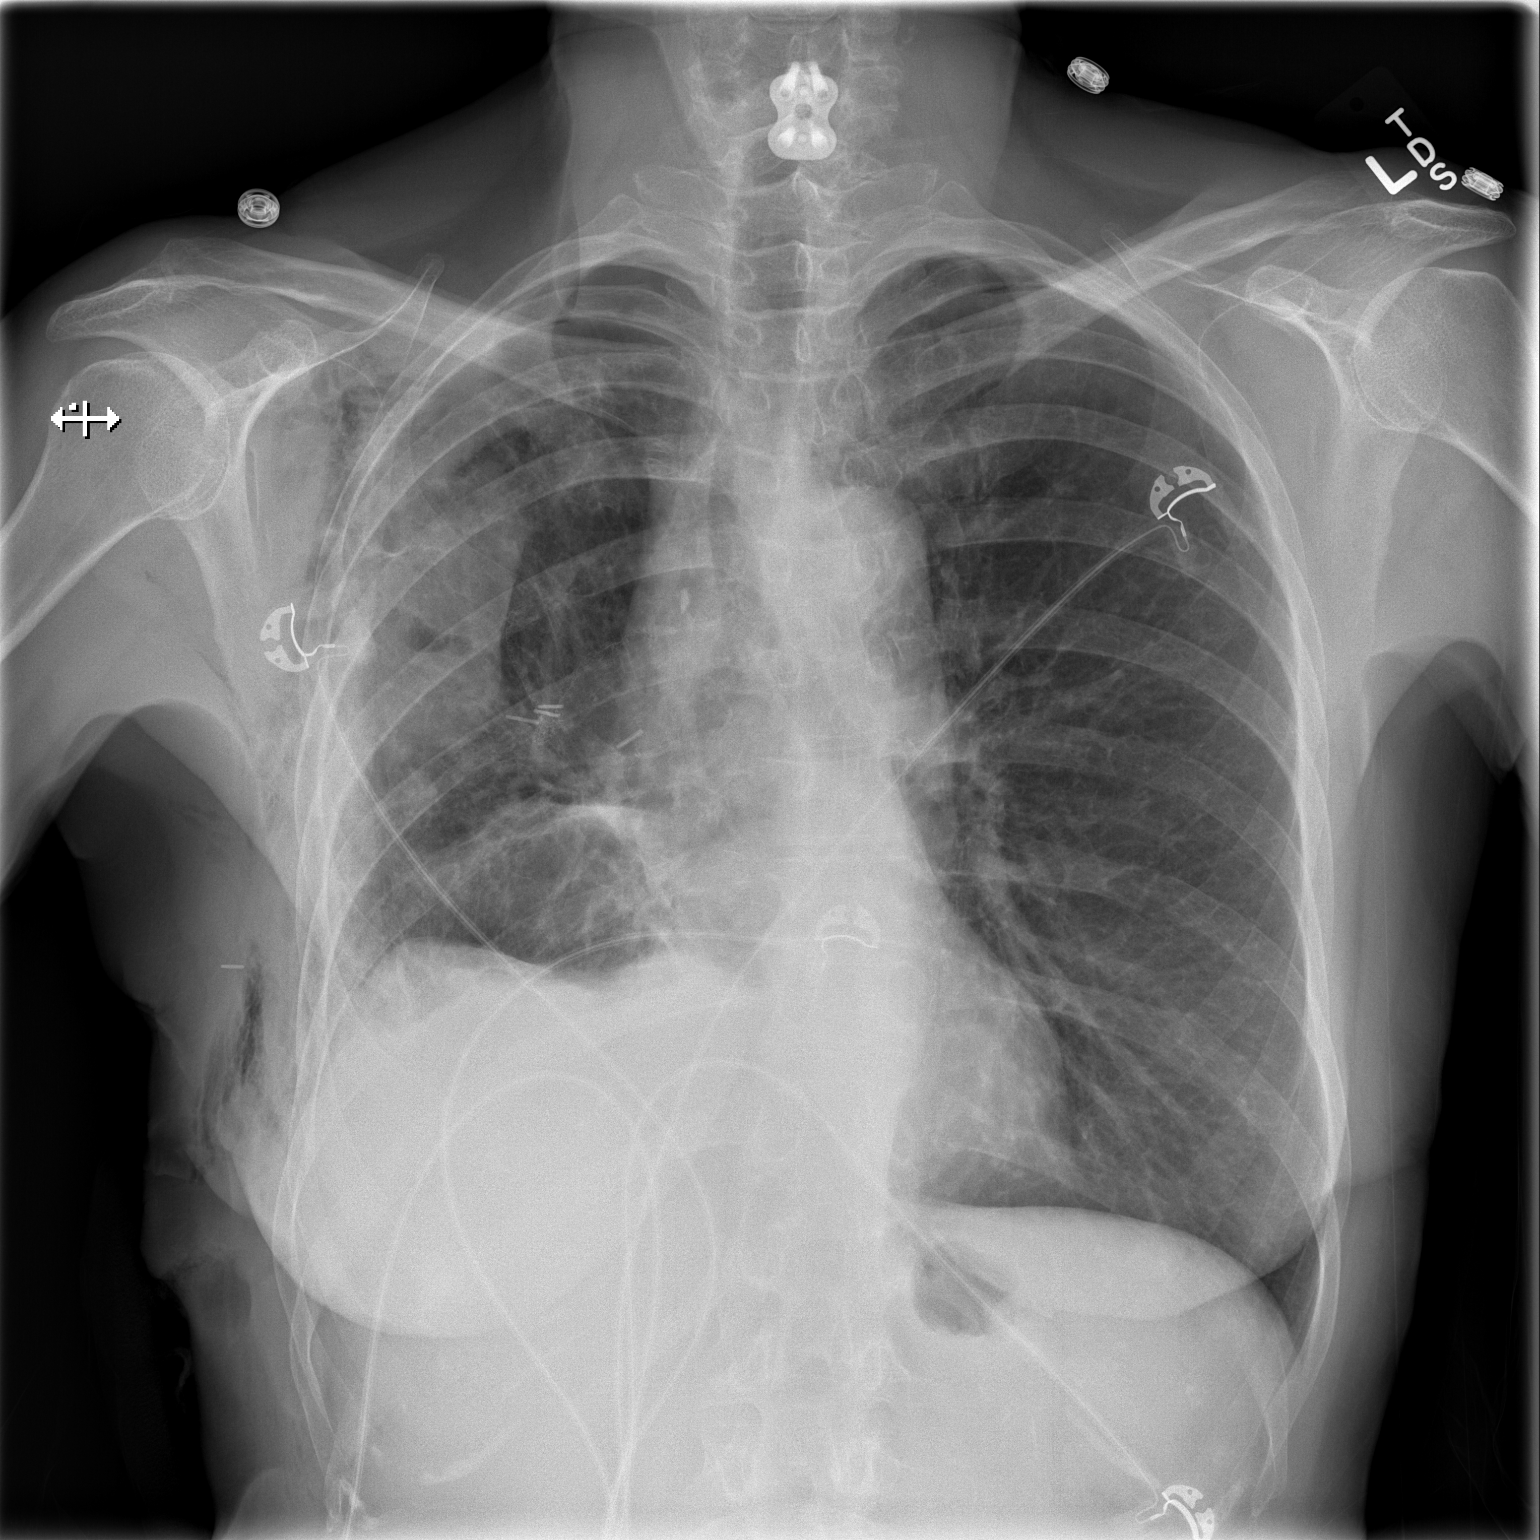

[w chest lat]
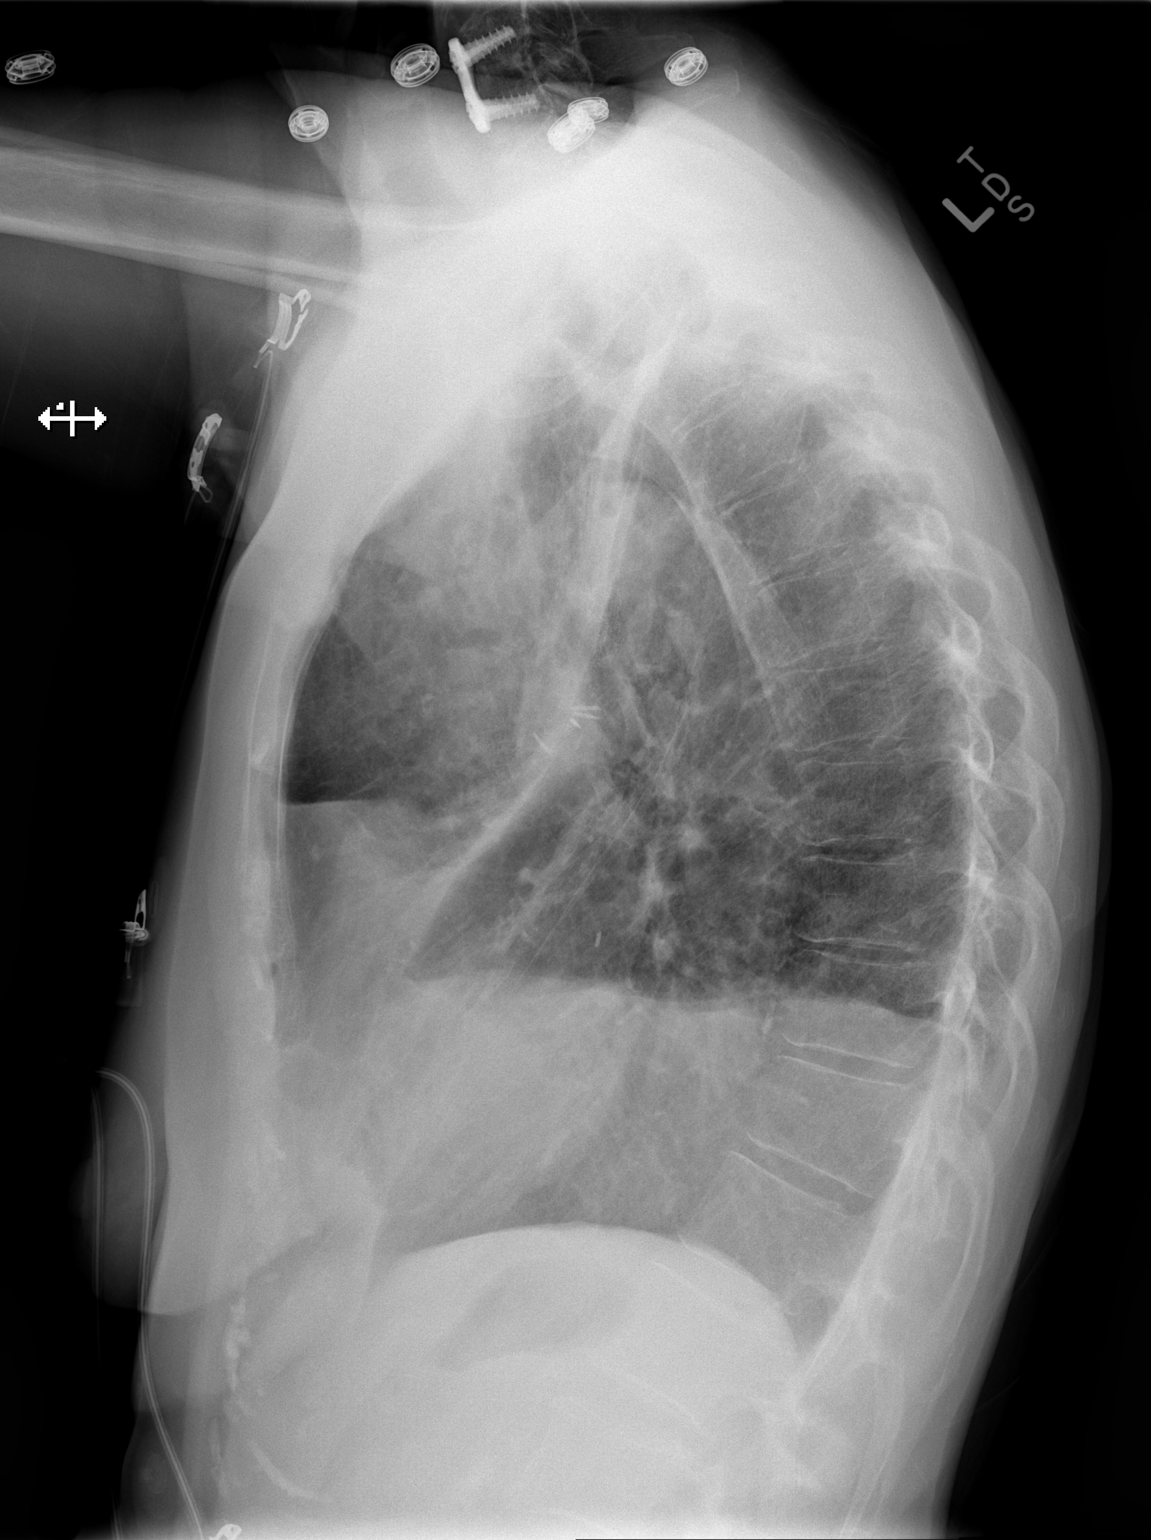

[2 of 2 positions shown; findings below may reference images not displayed]

FINDINGS: Heart size appears normal.

Postoperative change in the right lung with volume loss noted.
This is stable from previous exam.

Stable appearance of the right-sided hydropneumothorax.

No change in right chest wall soft tissue emphysema.

Left lung remains clear.
IMPRESSION: 1.  No change compared with previous exam.

## 2013-11-20 NOTE — Patient Instructions (Signed)
Your procedure is scheduled on:  Monday, 11/30/13  Report to [facility] at    0830    AM.  Call this number if you have problems the morning of surgery: [phone]   Remember:   Do not eat or drink   After Midnight.  Take these medicines the morning of surgery with A SIP OF WATER: xanax, nexium, verapamil, claritin, advair, albuterol, atrovent. Please use and bring your inhalers.   Do not wear jewelry, make-up or nail polish.  Do not wear lotions, powders, or perfumes. You may wear deodorant.  Do not bring valuables to the hospital.  Contacts, dentures or bridgework may not be worn into surgery.     Patients discharged the day of surgery will not be allowed to drive home.  Name and phone number of your driver: driver  Special Instructions: Use eye drops as directed.   Please read over the following fact sheets that you were given: Pain Booklet, Anesthesia Post-op Instructions and Care and Recovery After Surgery    Cataract Surgery  A cataract is a clouding of the lens of the eye. When a lens becomes cloudy, vision is reduced based on the degree and nature of the clouding. Surgery may be needed to improve vision. Surgery removes the cloudy lens and usually replaces it with a substitute lens (intraocular lens, IOL). LET YOUR EYE DOCTOR KNOW ABOUT:  Allergies to food or medicine.   Medicines taken including herbs, eyedrops, over-the-counter medicines, and creams.   Use of steroids (by mouth or creams).   Previous problems with anesthetics or numbing medicine.   History of bleeding problems or blood clots.   Previous surgery.   Other health problems, including diabetes and kidney problems.   Possibility of pregnancy, if this applies.  RISKS AND COMPLICATIONS  Infection.   Inflammation of the eyeball (endophthalmitis) that can spread to both eyes (sympathetic ophthalmia).   Poor wound healing.   If an IOL is inserted, it can later fall out of proper position. This is very  uncommon.   Clouding of the part of your eye that holds an IOL in place. This is called an "after-cataract." These are uncommon, but easily treated.  BEFORE THE PROCEDURE  Do not eat or drink anything except small amounts of water for 8 to 12 before your surgery, or as directed by your caregiver.   Unless you are told otherwise, continue any eyedrops you have been prescribed.   Talk to your primary caregiver about all other medicines that you take (both prescription and non-prescription). In some cases, you may need to stop or change medicines near the time of your surgery. This is most important if you are taking blood-thinning medicine.Do not stop medicines unless you are told to do so.   Arrange for someone to drive you to and from the procedure.   Do not put contact lenses in either eye on the day of your surgery.  PROCEDURE There is more than one method for safely removing a cataract. Your doctor can explain the differences and help determine which is best for you. Phacoemulsification surgery is the most common form of cataract surgery.  An injection is given behind the eye or eyedrops are given to make this a painless procedure.   A small cut (incision) is made on the edge of the clear, dome-shaped surface that covers the front of the eye (cornea).   A tiny probe is painlessly inserted into the eye. This device gives off ultrasound waves that soften  and break up the cloudy center of the lens. This makes it easier for the cloudy lens to be removed by suction.   An IOL may be implanted.   The normal lens of the eye is covered by a clear capsule. Part of that capsule is intentionally left in the eye to support the IOL.   Your surgeon may or may not use stitches to close the incision.  There are other forms of cataract surgery that require a larger incision and stiches to close the eye. This approach is taken in cases where the doctor feels that the cataract cannot be easily removed  using phacoemulsification. AFTER THE PROCEDURE  When an IOL is implanted, it does not need care. It becomes a permanent part of your eye and cannot be seen or felt.   Your doctor will schedule follow-up exams to check on your progress.   Review your other medicines with your doctor to see which can be resumed after surgery.   Use eyedrops or take medicine as prescribed by your doctor.  Document Released: 02/22/2011 Document Reviewed: 02/19/2011 [facility] Patient Information 2012 [place].  PATIENT INSTRUCTIONS POST-ANESTHESIA  IMMEDIATELY FOLLOWING SURGERY:  Do not drive or operate machinery for the first twenty four hours after surgery.  Do not make any important decisions for twenty four hours after surgery or while taking narcotic pain medications or sedatives.  If you develop intractable nausea and vomiting or a severe headache please notify your doctor immediately.  FOLLOW-UP:  Please make an appointment with your surgeon as instructed. You do not need to follow up with anesthesia unless specifically instructed to do so.  WOUND CARE INSTRUCTIONS (if applicable):  Keep a dry clean dressing on the anesthesia/puncture wound site if there is drainage.  Once the wound has quit draining you may leave it open to air.  Generally you should leave the bandage intact for twenty four hours unless there is drainage.  If the epidural site drains for more than 36-48 hours please call the anesthesia department.  QUESTIONS?:  Please feel free to call your physician or the hospital operator if you have any questions, and they will be happy to assist you.

## 2013-11-24 ENCOUNTER — Encounter (HOSPITAL_COMMUNITY): Payer: Self-pay

## 2013-11-24 ENCOUNTER — Encounter (HOSPITAL_COMMUNITY)
Admission: RE | Admit: 2013-11-24 | Discharge: 2013-11-24 | Disposition: A | Discharge status: Home / Self Care | Payer: Medicare Other | Source: Ambulatory Visit | Attending: Ophthalmology | Admitting: Ophthalmology

## 2013-11-24 DIAGNOSIS — H2589 Other age-related cataract: Secondary | ICD-10-CM | POA: Insufficient documentation

## 2013-11-24 DIAGNOSIS — Z01818 Encounter for other preprocedural examination: Secondary | ICD-10-CM | POA: Insufficient documentation

## 2013-11-24 LAB — BASIC METABOLIC PANEL
ANION GAP: 10 (ref 5–15)
BUN: 24 mg/dL — ABNORMAL HIGH (ref 6–23)
CALCIUM: 10.2 mg/dL (ref 8.4–10.5)
CO2: 35 mEq/L — ABNORMAL HIGH (ref 19–32)
CREATININE: 1.01 mg/dL (ref 0.50–1.10)
Chloride: 95 mEq/L — ABNORMAL LOW (ref 96–112)
GFR, EST AFRICAN AMERICAN: 65 mL/min — AB (ref >90)
GFR, EST NON AFRICAN AMERICAN: 56 mL/min — AB (ref >90)
Glucose, Bld: 93 mg/dL (ref 70–99)
Potassium: 4.4 mEq/L (ref 3.7–5.3)
Sodium: 140 mEq/L (ref 137–147)

## 2013-11-24 LAB — HEMOGLOBIN AND HEMATOCRIT, BLOOD
HEMATOCRIT: 44.8 % (ref 36.0–46.0)
HEMOGLOBIN: 15.3 g/dL — AB (ref 12.0–15.0)

## 2013-11-24 NOTE — Pre-Procedure Instructions (Signed)
Patient given information to sign up for my chart at home. 

## 2013-11-27 MED ORDER — CYCLOPENTOLATE-PHENYLEPHRINE OP SOLN OPTIME - NO CHARGE
OPHTHALMIC | Status: AC
  Filled 2013-11-27: qty 2

## 2013-11-27 MED ORDER — LIDOCAINE HCL 3.5 % OP GEL
OPHTHALMIC | Status: AC
  Filled 2013-11-27: qty 1

## 2013-11-27 MED ORDER — PHENYLEPHRINE HCL 2.5 % OP SOLN
OPHTHALMIC | Status: AC
  Filled 2013-11-27: qty 15

## 2013-11-27 MED ORDER — LIDOCAINE HCL (PF) 1 % IJ SOLN
INTRAMUSCULAR | Status: AC
  Filled 2013-11-27: qty 2

## 2013-11-27 MED ORDER — TETRACAINE HCL 0.5 % OP SOLN
OPHTHALMIC | Status: AC
  Filled 2013-11-27: qty 2

## 2013-11-27 MED ORDER — NEOMYCIN-POLYMYXIN-DEXAMETH 3.5-10000-0.1 OP SUSP
OPHTHALMIC | Status: AC
  Filled 2013-11-27: qty 5

## 2013-11-30 ENCOUNTER — Ambulatory Visit (HOSPITAL_COMMUNITY)
Admission: RE | Admit: 2013-11-30 | Discharge: 2013-11-30 | Disposition: A | Discharge status: Home / Self Care | Payer: Medicare Other | Source: Ambulatory Visit | Attending: Ophthalmology | Admitting: Ophthalmology

## 2013-11-30 ENCOUNTER — Encounter (HOSPITAL_COMMUNITY): Payer: Medicare Other | Admitting: Anesthesiology

## 2013-11-30 ENCOUNTER — Encounter (HOSPITAL_COMMUNITY)
Admission: RE | Disposition: A | Discharge status: Home / Self Care | Payer: Self-pay | Source: Ambulatory Visit | Attending: Ophthalmology

## 2013-11-30 ENCOUNTER — Ambulatory Visit (HOSPITAL_COMMUNITY): Payer: Medicare Other | Admitting: Anesthesiology

## 2013-11-30 ENCOUNTER — Encounter (HOSPITAL_COMMUNITY): Payer: Self-pay | Admitting: *Deleted

## 2013-11-30 DIAGNOSIS — F17200 Nicotine dependence, unspecified, uncomplicated: Secondary | ICD-10-CM | POA: Insufficient documentation

## 2013-11-30 DIAGNOSIS — H2589 Other age-related cataract: Secondary | ICD-10-CM | POA: Insufficient documentation

## 2013-11-30 DIAGNOSIS — H268 Other specified cataract: Secondary | ICD-10-CM | POA: Insufficient documentation

## 2013-11-30 DIAGNOSIS — Z79899 Other long term (current) drug therapy: Secondary | ICD-10-CM | POA: Insufficient documentation

## 2013-11-30 DIAGNOSIS — J449 Chronic obstructive pulmonary disease, unspecified: Secondary | ICD-10-CM | POA: Diagnosis not present

## 2013-11-30 DIAGNOSIS — I10 Essential (primary) hypertension: Secondary | ICD-10-CM | POA: Insufficient documentation

## 2013-11-30 DIAGNOSIS — J4489 Other specified chronic obstructive pulmonary disease: Secondary | ICD-10-CM | POA: Insufficient documentation

## 2013-11-30 HISTORY — PX: CATARACT EXTRACTION W/PHACO: SHX586

## 2013-11-30 SURGERY — PHACOEMULSIFICATION, CATARACT, WITH IOL INSERTION
Anesthesia: Monitor Anesthesia Care | Site: Eye | Laterality: Left

## 2013-11-30 MED ORDER — EPINEPHRINE HCL 1 MG/ML IJ SOLN
INTRAOCULAR | Status: DC | PRN
  Administered 2013-11-30: 10:00:00

## 2013-11-30 MED ORDER — LIDOCAINE HCL (PF) 1 % IJ SOLN
INTRAOCULAR | Status: DC | PRN
  Administered 2013-11-30: 10:00:00 via OPHTHALMIC

## 2013-11-30 MED ORDER — LIDOCAINE 3.5 % OP GEL OPTIME - NO CHARGE
OPHTHALMIC | Status: DC | PRN
  Administered 2013-11-30: 2 [drp] via OPHTHALMIC

## 2013-11-30 MED ORDER — PHENYLEPHRINE HCL 2.5 % OP SOLN
1.0000 [drp] | OPHTHALMIC | Status: AC
  Administered 2013-11-30 (×3): 1 [drp] via OPHTHALMIC

## 2013-11-30 MED ORDER — TETRACAINE HCL 0.5 % OP SOLN
1.0000 [drp] | OPHTHALMIC | Status: AC
  Administered 2013-11-30 (×3): 1 [drp] via OPHTHALMIC

## 2013-11-30 MED ORDER — EPINEPHRINE HCL 1 MG/ML IJ SOLN
INTRAMUSCULAR | Status: AC
  Filled 2013-11-30: qty 1

## 2013-11-30 MED ORDER — MIDAZOLAM HCL 2 MG/2ML IJ SOLN
1.0000 mg | INTRAMUSCULAR | Status: DC | PRN
  Administered 2013-11-30: 2 mg via INTRAVENOUS

## 2013-11-30 MED ORDER — FENTANYL CITRATE 0.05 MG/ML IJ SOLN
25.0000 ug | INTRAMUSCULAR | Status: AC
  Administered 2013-11-30 (×2): 25 ug via INTRAVENOUS

## 2013-11-30 MED ORDER — BSS IO SOLN
INTRAOCULAR | Status: DC | PRN
Start: 2013-11-30 — End: 2013-11-30
  Administered 2013-11-30: 15 mL via INTRAOCULAR

## 2013-11-30 MED ORDER — LIDOCAINE HCL 3.5 % OP GEL
1.0000 "application " | Freq: Once | OPHTHALMIC | Status: AC
  Administered 2013-11-30: 1 via OPHTHALMIC

## 2013-11-30 MED ORDER — CYCLOPENTOLATE-PHENYLEPHRINE 0.2-1 % OP SOLN
1.0000 [drp] | OPHTHALMIC | Status: AC
  Administered 2013-11-30 (×3): 1 [drp] via OPHTHALMIC

## 2013-11-30 MED ORDER — LIDOCAINE HCL (PF) 1 % IJ SOLN: INTRAMUSCULAR | Status: DC | PRN

## 2013-11-30 MED ORDER — FENTANYL CITRATE 0.05 MG/ML IJ SOLN
INTRAMUSCULAR | Status: AC
  Filled 2013-11-30: qty 2

## 2013-11-30 MED ORDER — NEOMYCIN-POLYMYXIN-DEXAMETH 3.5-10000-0.1 OP SUSP
OPHTHALMIC | Status: DC | PRN
  Administered 2013-11-30: 2 [drp] via OPHTHALMIC

## 2013-11-30 MED ORDER — LACTATED RINGERS IV SOLN
INTRAVENOUS | Status: DC
  Administered 2013-11-30: 09:00:00 via INTRAVENOUS

## 2013-11-30 MED ORDER — PROVISC 10 MG/ML IO SOLN
INTRAOCULAR | Status: DC | PRN
  Administered 2013-11-30: 0.85 mL via INTRAOCULAR

## 2013-11-30 MED ORDER — MIDAZOLAM HCL 2 MG/2ML IJ SOLN
INTRAMUSCULAR | Status: AC
  Filled 2013-11-30: qty 2

## 2013-11-30 MED ORDER — POVIDONE-IODINE 5 % OP SOLN
OPHTHALMIC | Status: DC | PRN
Start: 2013-11-30 — End: 2013-11-30
  Administered 2013-11-30: 1 via OPHTHALMIC

## 2013-11-30 SURGICAL SUPPLY — 34 items
CAPSULAR TENSION RING-AMO (OPHTHALMIC RELATED) IMPLANT
CLOTH BEACON ORANGE TIMEOUT ST (SAFETY) ×2 IMPLANT
EYE SHIELD UNIVERSAL CLEAR (GAUZE/BANDAGES/DRESSINGS) ×2 IMPLANT
GLOVE BIO SURGEON STRL SZ 6.5 (GLOVE) IMPLANT
GLOVE BIO SURGEONS STRL SZ 6.5 (GLOVE)
GLOVE BIOGEL PI IND STRL 6.5 (GLOVE) IMPLANT
GLOVE BIOGEL PI IND STRL 7.0 (GLOVE) IMPLANT
GLOVE BIOGEL PI IND STRL 7.5 (GLOVE) IMPLANT
GLOVE BIOGEL PI INDICATOR 6.5 (GLOVE) ×2
GLOVE BIOGEL PI INDICATOR 7.0 (GLOVE)
GLOVE BIOGEL PI INDICATOR 7.5 (GLOVE)
GLOVE ECLIPSE 6.5 STRL STRAW (GLOVE) IMPLANT
GLOVE ECLIPSE 7.0 STRL STRAW (GLOVE) IMPLANT
GLOVE ECLIPSE 7.5 STRL STRAW (GLOVE) IMPLANT
GLOVE EXAM NITRILE LRG STRL (GLOVE) IMPLANT
GLOVE EXAM NITRILE MD LF STRL (GLOVE) ×2 IMPLANT
GLOVE SKINSENSE NS SZ6.5 (GLOVE)
GLOVE SKINSENSE NS SZ7.0 (GLOVE)
GLOVE SKINSENSE STRL SZ6.5 (GLOVE) IMPLANT
GLOVE SKINSENSE STRL SZ7.0 (GLOVE) IMPLANT
KIT VITRECTOMY (OPHTHALMIC RELATED) IMPLANT
PAD ARMBOARD 7.5X6 YLW CONV (MISCELLANEOUS) ×2 IMPLANT
PROC W NO LENS (INTRAOCULAR LENS)
PROC W SPEC LENS (INTRAOCULAR LENS)
PROCESS W NO LENS (INTRAOCULAR LENS) IMPLANT
PROCESS W SPEC LENS (INTRAOCULAR LENS) IMPLANT
RETRACTOR IRIS SIGHTPATH (OPHTHALMIC RELATED) IMPLANT
RING MALYGIN (MISCELLANEOUS) IMPLANT
SIGHTPATH CAT PROC W REG LENS (Ophthalmic Related) ×3 IMPLANT
SYRINGE LUER LOK 1CC (MISCELLANEOUS) ×2 IMPLANT
TAPE SURG TRANSPORE 1 IN (GAUZE/BANDAGES/DRESSINGS) IMPLANT
TAPE SURGICAL TRANSPORE 1 IN (GAUZE/BANDAGES/DRESSINGS) ×2
VISCOELASTIC ADDITIONAL (OPHTHALMIC RELATED) IMPLANT
WATER STERILE IRR 250ML POUR (IV SOLUTION) ×2 IMPLANT

## 2013-11-30 NOTE — Anesthesia Preprocedure Evaluation (Signed)
Anesthesia Evaluation  Patient identified by MRN, date of birth, ID band Patient awake    Reviewed: Allergy & Precautions, H&P , NPO status , Patient's Chart, lab work & pertinent test results, reviewed documented beta blocker date and time   Airway Mallampati: I TM Distance: <3 FB     Dental  (+) Teeth Intact, Chipped   Pulmonary COPD COPD inhaler, Current Smoker, former smoker,  breath sounds clear to auscultation        Cardiovascular hypertension, Pt. on medications Rhythm:Regular Rate:Normal     Neuro/Psych  Neuromuscular disease    GI/Hepatic GERD-  Medicated,  Endo/Other    Renal/GU      Musculoskeletal  (+) Arthritis -, Osteoarthritis,    Abdominal (+)  Abdomen: soft. Bowel sounds: normal.  Peds  Hematology   Anesthesia Other Findings   Reproductive/Obstetrics                           Anesthesia Physical Anesthesia Plan  ASA: III  Anesthesia Plan: MAC   Post-op Pain Management:    Induction: Intravenous  Airway Management Planned: Nasal Cannula  Additional Equipment:   Intra-op Plan:   Post-operative Plan:   Informed Consent: I have reviewed the patients History and Physical, chart, labs and discussed the procedure including the risks, benefits and alternatives for the proposed anesthesia with the patient or authorized representative who has indicated his/her understanding and acceptance.     Plan Discussed with:   Anesthesia Plan Comments:         Anesthesia Quick Evaluation

## 2013-11-30 NOTE — H&P (Signed)
I have reviewed the H&P, the patient was re-examined, and I have identified no interval changes in medical condition and plan of care since the history and physical of record  

## 2013-11-30 NOTE — Anesthesia Postprocedure Evaluation (Signed)
  Anesthesia Post-op Note  Patient: Shawna [name]  Procedure(s) Performed: Procedure(s) with comments: CATARACT EXTRACTION PHACO AND INTRAOCULAR LENS PLACEMENT (IOC) (Left) - CDE:  11.35  Patient Location: Short Stay  Anesthesia Type:MAC  Level of Consciousness: awake, alert , oriented and patient cooperative  Airway and Oxygen Therapy: Patient Spontanous Breathing  Post-op Pain: none  Post-op Assessment: Post-op Vital signs reviewed, Patient's Cardiovascular Status Stable, Respiratory Function Stable, Patent Airway and Pain level controlled  Post-op Vital Signs: Reviewed and stable  Last Vitals:  Filed Vitals:   11/30/13 0935  BP: 144/84  Temp:   Resp: 25    Complications: No apparent anesthesia complications

## 2013-11-30 NOTE — Transfer of Care (Signed)
Immediate Anesthesia Transfer of Care Note  Patient: Shawna [name]  Procedure(s) Performed: Procedure(s) with comments: CATARACT EXTRACTION PHACO AND INTRAOCULAR LENS PLACEMENT (IOC) (Left) - CDE:  11.35  Patient Location: Short Stay  Anesthesia Type:MAC  Level of Consciousness: awake, alert , oriented and patient cooperative  Airway & Oxygen Therapy: Patient Spontanous Breathing  Post-op Assessment: Report given to PACU RN, Post -op Vital signs reviewed and stable and Patient moving all extremities  Post vital signs: Reviewed and stable  Complications: No apparent anesthesia complications

## 2013-11-30 NOTE — Op Note (Signed)
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  Assistants: None  Anesthesia: Topical with MAC  Indications: Painless, progressive loss of vision with compromise of daily activities.  Surgery: Cataract Extraction with Intraocular lens Implant Left Eye  Discription: The patient had dilating drops and viscous lidocaine placed into the Left eye in the pre-op holding area. After transfer to the operating room, a time out was performed. The patient was then prepped and draped. Beginning with a 61 degree blade a paracentesis port was made at the surgeon's 2 o'clock position. The anterior chamber was then filled with 1% non-preserved lidocaine. This was followed by filling the anterior chamber with Provisc.  A 2.63mm keratome blade was used to make a clear corneal incision at the temporal limbus.  A bent cystatome needle was used to create a continuous tear capsulotomy. Hydrodissection was performed with balanced salt solution on a Fine canula. The lens nucleus was then removed using the phacoemulsification handpiece. Residual cortex was removed with the I&A handpiece. The anterior chamber and capsular bag were refilled with Provisc. A posterior chamber intraocular lens was placed into the capsular bag with it's injector. The implant was positioned with the Kuglan hook. The Provisc was then removed from the anterior chamber and capsular bag with the I&A handpiece. Stromal hydration of the main incision and paracentesis port was performed with BSS on a Fine canula. The wounds were tested for leak which was negative. The patient tolerated the procedure well. There were no operative complications. The patient was then transferred to the recovery room in stable condition.  Complications: None  Specimen: None  EBL:  None  Prosthetic device: Hoya iSert 250, power 19.5 D, SN [id number].

## 2013-11-30 NOTE — Discharge Instructions (Signed)

## 2013-12-01 ENCOUNTER — Encounter (HOSPITAL_COMMUNITY): Payer: Self-pay | Admitting: Ophthalmology

## 2013-12-14 ENCOUNTER — Other Ambulatory Visit: Payer: Self-pay | Admitting: Adult Health

## 2013-12-18 ENCOUNTER — Telehealth: Payer: Self-pay | Admitting: Pulmonary Disease

## 2013-12-18 MED ORDER — FUROSEMIDE 20 MG PO TABS: 20.0000 mg | ORAL_TABLET | Freq: Every day | ORAL | Status: DC | PRN

## 2013-12-18 NOTE — Telephone Encounter (Signed)
Refill sent, pt aware. [place] Bing, CMA

## 2014-01-15 ENCOUNTER — Telehealth: Payer: Self-pay | Admitting: Pulmonary Disease

## 2014-01-15 MED ORDER — PREDNISONE 10 MG PO TABS: ORAL_TABLET | ORAL | Status: DC

## 2014-01-15 NOTE — Telephone Encounter (Signed)
Per RA call in pred 20 mg daily x 5 days then 10 mg daily x 5 days. Pt needs OV w/ him at next available.  Called pt and aware of recs. RX sent in. Pt has appt in November and is aware too keep this appt.

## 2014-01-15 NOTE — Telephone Encounter (Signed)
Called pt. She c/o dry cough, slight wheezing/chest tx, increase SOB w/ activity x couple days. No openings in office today. Please advise RA thanks  Allergies  Allergen Reactions  . Aspirin Swelling    Lips and face swelled.  [name] [Oxycodone-Aspirin] Other (See Comments)    "went out of my head"

## 2014-01-25 ENCOUNTER — Encounter: Payer: Self-pay | Admitting: Adult Health

## 2014-01-25 ENCOUNTER — Ambulatory Visit (INDEPENDENT_AMBULATORY_CARE_PROVIDER_SITE_OTHER): Payer: Medicare Other | Admitting: Adult Health

## 2014-01-25 VITALS — BP 122/82 | HR 101 | Temp 96.1°F | Ht 66.0 in | Wt 119.0 lb

## 2014-01-25 DIAGNOSIS — J449 Chronic obstructive pulmonary disease, unspecified: Secondary | ICD-10-CM

## 2014-01-25 MED ORDER — LEVOFLOXACIN 500 MG PO TABS: 500.0000 mg | ORAL_TABLET | Freq: Every day | ORAL | Status: DC

## 2014-01-25 MED ORDER — PREDNISONE 10 MG PO TABS: ORAL_TABLET | ORAL | Status: DC

## 2014-01-25 MED ORDER — ALBUTEROL SULFATE 108 (90 BASE) MCG/ACT IN AEPB: 2.0000 | INHALATION_SPRAY | RESPIRATORY_TRACT | Status: DC | PRN

## 2014-01-25 NOTE — Patient Instructions (Signed)
Levaquin 500mg  daily for 7 days -take with food  Mucinex DM Twice daily  As needed  Cough/congestion  Prednisone taper over next week.  Fluids and rest  Please contact office for sooner follow up if symptoms do not improve or worsen or seek emergency care  Follow up Dr. Elsworth Soho  As planned and As needed

## 2014-01-25 NOTE — Progress Notes (Signed)
Subjective:    Patient ID: Shawna [name], female    DOB: 1945/06/28, 68 y.o.   MRN: [id number]  HPI  68 year old ex smoker with COPD -gold B, RUL lobectomy in April 2013 for benign lesion (hendrickson) for FU of persistent dyspnea..  Admitted 01/2012 for AECOPD and CAP.  She has been on home O2 since 08/2011 (by PCP)  Quit smoking 05/2011 but relapsed in 2014  ABGs showed compensated hypercarbia.  3/ 2013 PFTs - DLCO 50%, FEV1 1.68 - 64% (pre-op)  She presented with PET positive Lt lower lung nodule increasing in size, underwent bronchoscopy 01/2013 and developed Lt PTX after procedure.  Pathology - showed atypical cells. BAL showed M.Cat -treated  Additional concern of note was of on-going hyponatremia. Her urine lytes, cortisol, and Tsh were nml, HCTZ was stopped.  PET did have positive area on left ovary bu this could not be seen on pelvic US.  Interim -  Due to persistent dyspnea & hoarseness - advair/ breo was held  Pt in hosp 3 times since 11/2012  She received multiple rounds of antibiotics & steroids over past few months, ENT eval neg  CT 1/22 /15 showed Recurrent LLL atelectasis without obstructive lesion  Echo nov 2013 nml LV function     ED visit 06/17/13 --. levaquin + pred  Persistent dyspnea -seems to have significant component of anxiety -increase in xanax to tid  helped   08/17/13  CT chest 07/2013 - 1.2 x 1.2 cm irregular left lower lobe pulmonary nodule continues to progress comparing back over multiple studies to 05/10/2011. 12/25/2012 -measured 8 x 10 mm. Prednisone has been tapered to 5 mg PFTs showed severe airway obstruction with FEV1 of 0.95-37% and DLCO of 38% Daughter reports an episode of passing out - she does appear to be mildly orthostatic , heart rate increased from 76-102, pressure dropped slightly from 924 systolic to 268  05/20/17 Follow up COPD and Lung Nodule.  Says her breathing has good and bad day. Feels like at times it is getting worse. Gets  winded walking far. Had intermittent dry cough. No fever, discolored mucus or fever.  Had worse cough and dyspnea couple of weeks ago, took left over prednisone that helped w/ return to her baseline. Remains on Advair Twice daily and Duoneb Four times a day  .  Denies chest pain, orthopnea, edema or hemoptysis.   Pt has LLL lung nodule w/ most recent CT 07/2013 1.2x1.2 cm,  (prev FOB 01/2013 showed atypical cells). Seen by Dr. Valere Dross Radiation Oncology , w/ repeat +PET along the Left upper mediastinus superior to the left hilum . See by Dr. Inda Merlin 7/6 w/ plans to follow nodule serially as we do not have tissue dx to proceede with chemo. Case was discussed at thoracic oncology conference, plans to repeat CT this month to check for growth and then decide on radiation therapy per Dr. Valere Dross.   01/25/2014 Acute OV  Complains of increased SOB, tightness, some wheezing, dry cough with chest congestion for 2 weeks.  Took prednisone taper last week.  Felt better but symptoms returned when she finished her prednisone taper. Denies any f/c/s, n/v/d, hemoptysis, PND, leg swelling  Remains on Advair Twice daily and Duoneb Four times a day  .      Past Medical History  Diagnosis Date  . COPD (chronic obstructive pulmonary disease)   . HTN (hypertension)   . B12 deficiency   . OA (osteoarthritis)   . Hypokalemia   .  Mass of lung   . GERD (gastroesophageal reflux disease)   . Diverticulosis of colon (without mention of hemorrhage)   . 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 Pulmonary nodule      Review of Systems neg for any significant sore throat, dysphagia, itching, sneezing, nasal congestion or excess/ purulent secretions, fever, chills, sweats, unintended wt loss, pleuritic or exertional cp, hempoptysis, orthopnea pnd or change in chronic leg swelling. Also denies presyncope, palpitations, heartburn, abdominal pain, nausea, vomiting, diarrhea or change in bowel or urinary habits,  dysuria,hematuria, rash, arthralgias, visual complaints, headache, numbness weakness or ataxia.     Objective:   Physical Exam  Gen. Pleasant, thin, chronically ill, in no distress, anxious affect ENT - no lesions, no post nasal drip Neck: No JVD, no thyromegaly, no carotid bruits Lungs: no use of accessory muscles, no dullness to percussion, decreased bs in bases, no wheezing  Cardiovascular: Rhythm regular, heart sounds  normal, no murmurs or gallops, no peripheral edema Abdomen: soft and non-tender, no hepatosplenomegaly, BS normal. Musculoskeletal: No deformities, no cyanosis or clubbing Neuro:  alert, non focal       Assessment & Plan:

## 2014-01-25 NOTE — Assessment & Plan Note (Signed)
Flare   Plan  Levaquin 500mg  daily for 7 days -take with food  Mucinex DM Twice daily  As needed  Cough/congestion  Prednisone taper over next week.  Fluids and rest  Please contact office for sooner follow up if symptoms do not improve or worsen or seek emergency care  Follow up Dr. Elsworth Soho  As planned and As needed

## 2014-02-02 ENCOUNTER — Ambulatory Visit (INDEPENDENT_AMBULATORY_CARE_PROVIDER_SITE_OTHER): Payer: Medicare Other | Admitting: Pulmonary Disease

## 2014-02-02 ENCOUNTER — Other Ambulatory Visit: Payer: Self-pay | Admitting: Pulmonary Disease

## 2014-02-02 ENCOUNTER — Encounter: Payer: Self-pay | Admitting: Pulmonary Disease

## 2014-02-02 VITALS — BP 122/82 | HR 95 | Ht 66.5 in | Wt 120.2 lb

## 2014-02-02 DIAGNOSIS — R911 Solitary pulmonary nodule: Secondary | ICD-10-CM

## 2014-02-02 DIAGNOSIS — J441 Chronic obstructive pulmonary disease with (acute) exacerbation: Secondary | ICD-10-CM

## 2014-02-02 MED ORDER — PREDNISONE 10 MG PO TABS: ORAL_TABLET | ORAL | Status: DC

## 2014-02-02 NOTE — Assessment & Plan Note (Signed)
Increase advair to 1 puff twice daily Prednisone Rx to keep handy & start taking if wheezing persists Prednisone 10 mg tabs  Take 2 tabs daily with food x 5ds, then 1 tab daily with food x 5ds then STOP x 1 refill Stay on duonebs 4 times/daily

## 2014-02-02 NOTE — Patient Instructions (Signed)
Increase advair to 1 puff twice daily Prednisone Rx to keep handy & start taking if wheezing persists Prednisone 10 mg tabs  Take 2 tabs daily with food x 5ds, then 1 tab daily with food x 5ds then STOP x 1 refill Stay on duonebs 4 times/daily Await CT scan in dec

## 2014-02-02 NOTE — Assessment & Plan Note (Signed)
Await CT scan in dec

## 2014-02-02 NOTE — Progress Notes (Signed)
Subjective:    Patient ID: Shawna [name], female    DOB: 09-11-1945, 68 y.o.   MRN: [id number]  HPI  68 year old ex smoker with COPD -gold B, RUL lobectomy in April 2013 for benign lesion (hendrickson) for FU of persistent dyspnea..  She has been on home O2 since 68/2013 (by PCP)  Quit smoking 05/2011 but relapsed in 2014  ABGs showed compensated hypercarbia.   Due to persistent dyspnea & hoarseness - advair/ breo was held  Pt in hosp 3 times since 11/2012  She received multiple rounds of antibiotics & steroids over past few months, ENT eval neg Persistent dyspnea -seems to have significant component of anxiety -increase in xanax to tid helped  Significant tests/ events  3/ 2013 PFTs - DLCO 50%, FEV1 1.68 - 64% (pre-op)  She presented with PET positive Lt lower lung nodule increasing in size, underwent bronchoscopy 01/2013 and developed Lt PTX after procedure.  Pathology - showed atypical cells. BAL showed M.Cat -treated  Additional concern of note was of on-going hyponatremia. Her urine lytes, cortisol, and Tsh were nml, HCTZ was stopped.  pelvic US.  - PET positive area on left ovary not seen  Interim -    CT 1/22 /15 showed Recurrent LLL atelectasis without obstructive lesion  Echo nov 2013 nml LV function   CT chest 07/2013 - 1.2 x 1.2 cm irregular left lower lobe pulmonary nodule continues to progress comparing back over multiple studies to 05/10/2011. 12/25/2012 -measured 8 x 10 mm.  PFTs 07/2013  severe airway obstruction with FEV1 of 0.95-37% and DLCO of 38%  02/02/2014  Chief Complaint  Patient presents with  . Follow-up    breathing not much better; took all of medicine given last week, but still has no breath.  Pain in right side of chest.  non-productive cough; pt would like something other than prednisone    01/25/2014 Acute OV >>levaquin + pred taper   Took prednisone taper last week.  Feels better Denies any f/c/s, n/v/d, hemoptysis, PND, leg  swelling Remains on Advair -taking only once daily and Duoneb Four times a day . Hoarseness has not come back  Seen by Dr. Valere Dross Radiation Oncology , w/ repeat +PET along the Left upper mediastinus superior to the left hilum . Seen by Dr. Inda Merlin 09/2013 w/ plans to follow nodule serially as we do not have tissue dx to proceede with chemo. Case was discussed at thoracic oncology conference  Past Medical History  Diagnosis Date  . COPD (chronic obstructive pulmonary disease)   . HTN (hypertension)   . B12 deficiency   . OA (osteoarthritis)   . Hypokalemia   . Mass of lung   . GERD (gastroesophageal reflux disease)   . Diverticulosis of colon (without mention of hemorrhage)   . 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 Pulmonary nodule      Review of Systems neg for any significant sore throat, dysphagia, itching, sneezing, nasal congestion or excess/ purulent secretions, fever, chills, sweats, unintended wt loss, pleuritic or exertional cp, hempoptysis, orthopnea pnd or change in chronic leg swelling. Also denies presyncope, palpitations, heartburn, abdominal pain, nausea, vomiting, diarrhea or change in bowel or urinary habits, dysuria,hematuria, rash, arthralgias, visual complaints, headache, numbness weakness or ataxia.     Objective:   Physical Exam  Gen. Pleasant, well-nourished, in no distress ENT - no lesions, no post nasal drip Neck: No JVD, no thyromegaly, no carotid bruits Lungs: no use of accessory muscles, no  dullness to percussion, clear without rales or rhonchi  Cardiovascular: Rhythm regular, heart sounds  normal, no murmurs or gallops, no peripheral edema Musculoskeletal: No deformities, no cyanosis or clubbing        Assessment & Plan:

## 2014-02-04 NOTE — Assessment & Plan Note (Signed)
Await Ct in dec 2015  May need empiric RT if growth

## 2014-02-15 ENCOUNTER — Telehealth: Payer: Self-pay | Admitting: Pulmonary Disease

## 2014-02-15 MED ORDER — DOXYCYCLINE HYCLATE 100 MG PO TABS: 100.0000 mg | ORAL_TABLET | Freq: Every day | ORAL | Status: DC

## 2014-02-15 NOTE — Telephone Encounter (Signed)
Patient notified.  Nothing further needed. 

## 2014-02-15 NOTE — Telephone Encounter (Signed)
Doxy sent to pharmacy, pt notified.  Patient wants to know if she needs to start the Prednisone?

## 2014-02-15 NOTE — Telephone Encounter (Signed)
Yes

## 2014-02-15 NOTE — Telephone Encounter (Signed)
Per last OV note: Increase advair to 1 puff twice daily  Prednisone Rx to keep handy & start taking if wheezing persists   I spoke with the pt and she states since last OV she has still been having SOB with activity, chest congestion, unable to cough anything up. She states she does not feel symptoms are worse then last OV but no better. She has prednisone taper on hand, but she is also requesting an rx for ABX. Please advise. Terrell Bing, CMA  Allergies  Allergen Reactions  . Aspirin Swelling    Lips and face swelled.  [name] [Oxycodone-Aspirin] Other (See Comments)    "went out of my head"

## 2014-02-15 NOTE — Telephone Encounter (Signed)
Doxy 100 daily x7

## 2014-03-05 ENCOUNTER — Ambulatory Visit (INDEPENDENT_AMBULATORY_CARE_PROVIDER_SITE_OTHER)
Admission: RE | Admit: 2014-03-05 | Discharge: 2014-03-05 | Disposition: A | Discharge status: Home / Self Care | Payer: Medicare Other | Source: Ambulatory Visit | Attending: Pulmonary Disease | Admitting: Pulmonary Disease

## 2014-03-05 DIAGNOSIS — R911 Solitary pulmonary nodule: Secondary | ICD-10-CM

## 2014-03-06 ENCOUNTER — Telehealth: Payer: Self-pay | Admitting: Pulmonary Disease

## 2014-03-08 MED ORDER — ALBUTEROL SULFATE 108 (90 BASE) MCG/ACT IN AEPB: 2.0000 | INHALATION_SPRAY | RESPIRATORY_TRACT | Status: DC | PRN

## 2014-03-08 NOTE — Telephone Encounter (Signed)
Called and spoke to pt. Pt requested refill on proair respiclick. Rx sent to preferred pharmacy. Pt verbalized understanding and denied any further questions or concerns at this time.

## 2014-03-11 ENCOUNTER — Telehealth: Payer: Self-pay | Admitting: Pulmonary Disease

## 2014-03-11 MED ORDER — AMOXICILLIN-POT CLAVULANATE 875-125 MG PO TABS: 1.0000 | ORAL_TABLET | Freq: Two times a day (BID) | ORAL | Status: DC

## 2014-03-11 NOTE — Telephone Encounter (Signed)
Offer augmentin 875, # 14  1 twice daily

## 2014-03-11 NOTE — Telephone Encounter (Signed)
Called and spoke with pt and she stated that she is having SOB this morning.  She has started on the prednisone that RA gave her but she feels that she needs abx to help out with this.  She is coughing but this is non productive. Pt stated that she would like this called in to the pharmacy today.  RA is off.  CY please advise. Thanks  Allergies  Allergen Reactions  . Aspirin Swelling    Lips and face swelled.  [name] [Oxycodone-Aspirin] Other (See Comments)    "went out of my head"    Current Outpatient Prescriptions on File Prior to Visit  Medication Sig Dispense Refill  . acetaminophen (TYLENOL) 500 MG tablet Take 500 mg by mouth every 6 (six) hours as needed for moderate pain.     Marland Kitchen albuterol (PROVENTIL HFA;VENTOLIN HFA) 108 (90 BASE) MCG/ACT inhaler Inhale 2 puffs into the lungs every 4 (four) hours as needed. For shortness of breath    . albuterol (PROVENTIL) (2.5 MG/3ML) 0.083% nebulizer solution Take 2.5 mg by nebulization 4 (four) times daily. For shortness of breath    . Albuterol Sulfate (PROAIR RESPICLICK) 426 (90 BASE) MCG/ACT AEPB Inhale 2 puffs into the lungs every 4 (four) hours as needed. 1 each 5  . ALPRAZolam (XANAX) 0.25 MG tablet Take 0.25 mg by mouth 2 (two) times daily.    . cyanocobalamin (,VITAMIN B-12,) 1000 MCG/ML injection Inject 1,000 mcg into the muscle every 30 (thirty) days. Given on the 10th of each month    . doxycycline (VIBRA-TABS) 100 MG tablet Take 1 tablet (100 mg total) by mouth daily. 7 tablet 0  . esomeprazole (NEXIUM) 20 MG capsule Take 20 mg by mouth daily at 12 noon.    . fluticasone (FLONASE) 50 MCG/ACT nasal spray Place 2 sprays into both nostrils as needed.    . Fluticasone-Salmeterol (ADVAIR) 250-50 MCG/DOSE AEPB Inhale 1 puff into the lungs 2 (two) times daily. Per patient-only uses once daily    . furosemide (LASIX) 20 MG tablet TAKE ONE TO TWO TABLETS BY MOUTH ONCE DAILY AS NEEDED FOR FLUID OR EDEMA 30 tablet 0  . guaiFENesin (MUCINEX) 600  MG 12 hr tablet Take 600 mg by mouth 2 (two) times daily.    Marland Kitchen ipratropium (ATROVENT) 0.02 % nebulizer solution Take 2.5 mLs (0.5 mg total) by nebulization 4 (four) times daily. Dx 496 360 mL 3  . KLOR-CON M20 20 MEQ tablet Take 20 mEq by mouth daily.     Marland Kitchen loratadine (CLARITIN) 10 MG tablet Take 1 tablet (10 mg total) by mouth daily. 30 tablet 0  . meloxicam (MOBIC) 15 MG tablet Take 15 mg by mouth daily.     . predniSONE (DELTASONE) 10 MG tablet Take 2 tabs daily w/ food x 5 days, 1 tab daily w/ food x 5 days then stop 15 tablet 1  . verapamil (CALAN-SR) 180 MG CR tablet Take 180 mg by mouth daily.     No current facility-administered medications on file prior to visit.

## 2014-03-11 NOTE — Telephone Encounter (Signed)
Pt is aware of CY's recommendation. Rx has been sent in.

## 2014-03-13 ENCOUNTER — Encounter (HOSPITAL_COMMUNITY): Payer: Self-pay | Admitting: *Deleted

## 2014-03-13 ENCOUNTER — Telehealth: Payer: Self-pay | Admitting: Internal Medicine

## 2014-03-13 ENCOUNTER — Inpatient Hospital Stay (HOSPITAL_COMMUNITY)
Admission: EM | Admit: 2014-03-13 | Discharge: 2014-03-18 | DRG: 190 | Disposition: A | Discharge status: Home / Self Care | Payer: Medicare Other | Attending: Internal Medicine | Admitting: Internal Medicine

## 2014-03-13 ENCOUNTER — Emergency Department (HOSPITAL_COMMUNITY): Payer: Medicare Other

## 2014-03-13 DIAGNOSIS — K219 Gastro-esophageal reflux disease without esophagitis: Secondary | ICD-10-CM | POA: Diagnosis present

## 2014-03-13 DIAGNOSIS — J441 Chronic obstructive pulmonary disease with (acute) exacerbation: Secondary | ICD-10-CM | POA: Diagnosis present

## 2014-03-13 DIAGNOSIS — Z8249 Family history of ischemic heart disease and other diseases of the circulatory system: Secondary | ICD-10-CM | POA: Diagnosis not present

## 2014-03-13 DIAGNOSIS — Z9981 Dependence on supplemental oxygen: Secondary | ICD-10-CM | POA: Diagnosis not present

## 2014-03-13 DIAGNOSIS — Z825 Family history of asthma and other chronic lower respiratory diseases: Secondary | ICD-10-CM | POA: Diagnosis not present

## 2014-03-13 DIAGNOSIS — C3432 Malignant neoplasm of lower lobe, left bronchus or lung: Secondary | ICD-10-CM | POA: Diagnosis present

## 2014-03-13 DIAGNOSIS — F411 Generalized anxiety disorder: Secondary | ICD-10-CM | POA: Diagnosis present

## 2014-03-13 DIAGNOSIS — Z66 Do not resuscitate: Secondary | ICD-10-CM | POA: Diagnosis present

## 2014-03-13 DIAGNOSIS — T380X5A Adverse effect of glucocorticoids and synthetic analogues, initial encounter: Secondary | ICD-10-CM | POA: Diagnosis present

## 2014-03-13 DIAGNOSIS — J9620 Acute and chronic respiratory failure, unspecified whether with hypoxia or hypercapnia: Secondary | ICD-10-CM

## 2014-03-13 DIAGNOSIS — J9621 Acute and chronic respiratory failure with hypoxia: Secondary | ICD-10-CM | POA: Diagnosis present

## 2014-03-13 DIAGNOSIS — I10 Essential (primary) hypertension: Secondary | ICD-10-CM | POA: Diagnosis present

## 2014-03-13 DIAGNOSIS — N179 Acute kidney failure, unspecified: Secondary | ICD-10-CM | POA: Diagnosis present

## 2014-03-13 DIAGNOSIS — F419 Anxiety disorder, unspecified: Secondary | ICD-10-CM | POA: Diagnosis present

## 2014-03-13 DIAGNOSIS — R0602 Shortness of breath: Secondary | ICD-10-CM

## 2014-03-13 DIAGNOSIS — Z87891 Personal history of nicotine dependence: Secondary | ICD-10-CM | POA: Diagnosis not present

## 2014-03-13 DIAGNOSIS — Z515 Encounter for palliative care: Secondary | ICD-10-CM

## 2014-03-13 LAB — CBC WITH DIFFERENTIAL/PLATELET
Basophils Absolute: 0 10*3/uL (ref 0.0–0.1)
Basophils Relative: 0 % (ref 0–1)
EOS PCT: 0 % (ref 0–5)
Eosinophils Absolute: 0 10*3/uL (ref 0.0–0.7)
HEMATOCRIT: 43 % (ref 36.0–46.0)
HEMOGLOBIN: 14.4 g/dL (ref 12.0–15.0)
Lymphocytes Relative: 2 % — ABNORMAL LOW (ref 12–46)
Lymphs Abs: 0.3 10*3/uL — ABNORMAL LOW (ref 0.7–4.0)
MCH: 31.9 pg (ref 26.0–34.0)
MCHC: 33.5 g/dL (ref 30.0–36.0)
MCV: 95.3 fL (ref 78.0–100.0)
MONO ABS: 0.7 10*3/uL (ref 0.1–1.0)
MONOS PCT: 4 % (ref 3–12)
Neutro Abs: 16.1 10*3/uL — ABNORMAL HIGH (ref 1.7–7.7)
Neutrophils Relative %: 94 % — ABNORMAL HIGH (ref 43–77)
Platelets: 127 10*3/uL — ABNORMAL LOW (ref 150–400)
RBC: 4.51 MIL/uL (ref 3.87–5.11)
RDW: 13.6 % (ref 11.5–15.5)
WBC: 17.1 10*3/uL — AB (ref 4.0–10.5)

## 2014-03-13 LAB — BASIC METABOLIC PANEL
Anion gap: 11 (ref 5–15)
BUN: 16 mg/dL (ref 6–23)
CALCIUM: 9.4 mg/dL (ref 8.4–10.5)
CHLORIDE: 96 meq/L (ref 96–112)
CO2: 28 mmol/L (ref 19–32)
Creatinine, Ser: 1.1 mg/dL (ref 0.50–1.10)
GFR calc Af Amer: 58 mL/min — ABNORMAL LOW (ref >90)
GFR calc non Af Amer: 50 mL/min — ABNORMAL LOW (ref >90)
GLUCOSE: 143 mg/dL — AB (ref 70–99)
Potassium: 3.9 mmol/L (ref 3.5–5.1)
Sodium: 135 mmol/L (ref 135–145)

## 2014-03-13 LAB — TROPONIN I: Troponin I: <0.03 ng/mL (ref <0.031)

## 2014-03-13 LAB — INFLUENZA PANEL BY PCR (TYPE A & B)
H1N1 flu by pcr: NOT DETECTED
INFLAPCR: NEGATIVE
Influenza B By PCR: NEGATIVE

## 2014-03-13 LAB — BRAIN NATRIURETIC PEPTIDE: B Natriuretic Peptide: 85.3 pg/mL (ref 0.0–100.0)

## 2014-03-13 MED ORDER — IPRATROPIUM-ALBUTEROL 0.5-2.5 (3) MG/3ML IN SOLN
3.0000 mL | RESPIRATORY_TRACT | Status: DC
  Administered 2014-03-13 – 2014-03-17 (×22): 3 mL via RESPIRATORY_TRACT
  Filled 2014-03-13 (×22): qty 3

## 2014-03-13 MED ORDER — PANTOPRAZOLE SODIUM 40 MG PO TBEC
40.0000 mg | DELAYED_RELEASE_TABLET | Freq: Every day | ORAL | Status: DC
  Administered 2014-03-13 – 2014-03-18 (×6): 40 mg via ORAL
  Filled 2014-03-13 (×7): qty 1

## 2014-03-13 MED ORDER — ALBUTEROL (5 MG/ML) CONTINUOUS INHALATION SOLN
10.0000 mg/h | INHALATION_SOLUTION | Freq: Once | RESPIRATORY_TRACT | Status: AC
  Administered 2014-03-13: 10 mg/h via RESPIRATORY_TRACT
  Filled 2014-03-13: qty 20

## 2014-03-13 MED ORDER — ACETAMINOPHEN 500 MG PO TABS
500.0000 mg | ORAL_TABLET | Freq: Four times a day (QID) | ORAL | Status: DC | PRN
  Administered 2014-03-15 – 2014-03-17 (×4): 500 mg via ORAL
  Filled 2014-03-13 (×4): qty 1

## 2014-03-13 MED ORDER — MOMETASONE FURO-FORMOTEROL FUM 100-5 MCG/ACT IN AERO
2.0000 | INHALATION_SPRAY | Freq: Two times a day (BID) | RESPIRATORY_TRACT | Status: DC
  Administered 2014-03-13 – 2014-03-15 (×5): 2 via RESPIRATORY_TRACT
  Filled 2014-03-13: qty 8.8

## 2014-03-13 MED ORDER — LEVOFLOXACIN 750 MG PO TABS
750.0000 mg | ORAL_TABLET | Freq: Every day | ORAL | Status: DC
  Administered 2014-03-13 – 2014-03-15 (×3): 750 mg via ORAL
  Filled 2014-03-13 (×3): qty 1

## 2014-03-13 MED ORDER — LORAZEPAM 0.5 MG PO TABS
0.5000 mg | ORAL_TABLET | Freq: Four times a day (QID) | ORAL | Status: DC | PRN
  Administered 2014-03-14: 0.5 mg via ORAL
  Filled 2014-03-13: qty 1

## 2014-03-13 MED ORDER — ENOXAPARIN SODIUM 40 MG/0.4ML ~~LOC~~ SOLN
40.0000 mg | SUBCUTANEOUS | Status: DC
  Administered 2014-03-13 – 2014-03-17 (×5): 40 mg via SUBCUTANEOUS
  Filled 2014-03-13 (×7): qty 0.4

## 2014-03-13 MED ORDER — VERAPAMIL HCL ER 180 MG PO TBCR
180.0000 mg | EXTENDED_RELEASE_TABLET | Freq: Every day | ORAL | Status: DC
  Administered 2014-03-14 – 2014-03-18 (×5): 180 mg via ORAL
  Filled 2014-03-13 (×5): qty 1

## 2014-03-13 MED ORDER — IPRATROPIUM-ALBUTEROL 0.5-2.5 (3) MG/3ML IN SOLN: 3.0000 mL | RESPIRATORY_TRACT | Status: DC | PRN

## 2014-03-13 MED ORDER — METHYLPREDNISOLONE SODIUM SUCC 125 MG IJ SOLR
125.0000 mg | Freq: Once | INTRAMUSCULAR | Status: AC
  Administered 2014-03-13: 125 mg via INTRAVENOUS
  Filled 2014-03-13: qty 2

## 2014-03-13 MED ORDER — PREDNISONE 50 MG PO TABS
50.0000 mg | ORAL_TABLET | Freq: Every day | ORAL | Status: DC
  Filled 2014-03-13 (×2): qty 1

## 2014-03-13 NOTE — ED Notes (Signed)
Pt reports having a non productive cough and sob for days, hx of copd, went to [facility] and sent here due to low spo2. At triage, spo2 is 100% on 2L [place]. ekg done.

## 2014-03-13 NOTE — Telephone Encounter (Signed)
Encounter opened in error

## 2014-03-13 NOTE — H&P (Signed)
Date: 03/13/2014               Patient Name:  Shawna [name] MRN: [id number]  DOB: [birth year] Age / Sex: 68 y.o., female   PCP: [name], DO         Medical Service: Internal Medicine Teaching Service         Attending Physician: Dr. Aldine Contes    First Contact: Dr. Venita Lick Pager: [phone]  Second Contact: Dr. Natasha Bence Pager: [phone]       After Hours (After 5p/  First Contact Pager: [phone]  weekends / holidays): Second Contact Pager: [phone]   Chief Complaint: worsened shortness of breath for 3 days  History of Present Illness: Shawna [name] is a 68 yo woman with a history of hypertension, stable malignant neoplasm of the LLL and Gold Stage I COPD with frequent exacerbations who is presenting with shortness of breath for 3 days. This episode reminds her of other hospitalizations she has had for COPD exacerbation. Her shortness of breath is present when seated, but worse when she walks. She has a cough that is not productive of sputum and denies chills, fever or headache. She started Augmentin and prednisone (40 mg) two days ago and also used her home Advair nebulizer every 4 hours, but her shortness of breath persisted. Of note, her pulmonologist, Dr. Elsworth Soho, recently prescribed Xanax 0.25 TID and commented that a component of anxiety may be contributing to her dyspnea; she has been taking Xanax BID.   Meds: No current facility-administered medications for this encounter.   Current Outpatient Prescriptions  Medication Sig Dispense Refill  . acetaminophen (TYLENOL) 500 MG tablet Take 500 mg by mouth every 6 (six) hours as needed for moderate pain.     .      .    5  . ALPRAZolam (XANAX) 0.25 MG tablet Take 0.25 mg by mouth 2 (two) times daily.    Marland Kitchen amoxicillin-clavulanate (AUGMENTIN) 875-125 MG per tablet Take 1 tablet by mouth 2 (two) times daily. 14 tablet 0  . cyanocobalamin (,VITAMIN B-12,) 1000 MCG/ML injection Inject 1,000 mcg into the muscle every 30  (thirty) days. Given on the 10th of each month    . esomeprazole (NEXIUM) 20 MG capsule Take 20 mg by mouth daily at 12 noon.    . fluticasone (FLONASE) 50 MCG/ACT nasal spray Place 2 sprays into both nostrils as needed for allergies.     . Fluticasone-Salmeterol (ADVAIR) 250-50 MCG/DOSE AEPB Inhale 1 puff into the lungs 2 (two) times daily.     . furosemide (LASIX) 20 MG tablet TAKE ONE TO TWO TABLETS BY MOUTH ONCE DAILY AS NEEDED FOR FLUID OR EDEMA 30 tablet 0  . guaiFENesin (MUCINEX) 600 MG 12 hr tablet Take 600 mg by mouth 2 (two) times daily.    .    3  . KLOR-CON M20 20 MEQ tablet Take 20 mEq by mouth daily.     Marland Kitchen loratadine (CLARITIN) 10 MG tablet Take 1 tablet (10 mg total) by mouth daily. 30 tablet 0  . meloxicam (MOBIC) 15 MG tablet Take 15 mg by mouth daily.     . predniSONE (DELTASONE) 10 MG tablet Take 2 tabs daily w/ food x 5 days, 1 tab daily w/ food x 5 days then stop 15 tablet 1  . verapamil (CALAN-SR) 180 MG CR tablet Take 180 mg by mouth daily.    Marland Kitchen doxycycline (VIBRA-TABS) 100 MG tablet Take 1 tablet (100 mg total) by  mouth daily. (Patient not taking: Reported on 03/13/2014) 7 tablet 0    Allergies: Allergies as of 03/13/2014 - Review Complete 03/13/2014  Allergen Reaction Noted  . Aspirin Swelling 01/19/2013  . Percodan [oxycodone-aspirin] Other (See Comments) 02/02/2012   Past Medical History  Diagnosis Date  . COPD (chronic obstructive pulmonary disease)   . HTN (hypertension)   . B12 deficiency   . OA (osteoarthritis)   . Hypokalemia   . Mass of lung   . GERD (gastroesophageal reflux disease)   . Diverticulosis of colon (without mention of hemorrhage)   . 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 Pulmonary nodule    Past Surgical History  Procedure Laterality Date  . Carpal tunnel release      bilateral  . Tubal ligation      x 2  . Cervical spine surgery      plate with screws  . Lung surgery Right 07/06/11    RUL, per patient Dr  Roxan Hockey  . Video bronchoscopy Bilateral 01/20/2013    Procedure: VIDEO BRONCHOSCOPY WITH FLUORO;  Surgeon: Rigoberto Noel, MD;  Location: [facility];  Service: Cardiopulmonary;  Laterality: Bilateral;  . Cataract extraction w/phaco Left 11/30/2013    Procedure: CATARACT EXTRACTION PHACO AND INTRAOCULAR LENS PLACEMENT (IOC);  Surgeon: Tonny Branch, MD;  Location: [facility];  Service: Ophthalmology;  Laterality: Left;  CDE:  11.35   Family History  Problem Relation Age of Onset  . Coronary artery disease Mother 56  . Alzheimer's disease Mother   . Colon cancer    . COPD Father    History   Social History  . Marital Status: Widowed    Spouse Name: N/A    Number of Children: 3  . Years of Education: N/A   Occupational History  . RETIRED    Social History Main Topics  . Smoking status: Former Smoker -- 1.00 packs/day for 40 years    Types: Cigarettes    Quit date: 06/30/2011  . Smokeless tobacco: Never Used     Comment: resumed for a short time  . Alcohol Use: No  . Drug Use: No  . Sexual Activity: No   Other Topics Concern  . Not on file   Social History Narrative   Lives alone.   Daily caffeine     Review of Systems: General: has been feeling well, no sick contacts, no chills Skin: no rashes or lesions HEENT: no headaches, no changes in vision Cardiac: no chest pain, no palpitations Respiratory: see HPI GI: no changes in BMs, no nausea, no vomiting Urinary: no changes in urination Msk: no joint pain, no swelling Psychiatric: history of anxiety   Physical Exam: Blood pressure 110/72, pulse 52, temperature 98.1 F (36.7 C), temperature source Oral, resp. rate 20, SpO2 93 %. Appearance: in NAD, lying in bed with 2L [place] O2 in place, daughter at bedside HEENT: AT/[place], PERRL, EOMi, no lymphadenopathy Heart: tachycardic, normal rhythm, normal S1S2, no MRG Lungs: diffuse inspiratory and expiratory wheezes, using accessory muscles Abdomen: thin, BS+, soft, nontender    Musculoskeletal: normal range of motion Extremities: no edema BLE Neurologic: A&Ox3, grossly intact Skin: no lesions or rashes   Lab results: Basic Metabolic Panel:  Recent Labs  03/13/14 1142  NA 135  K 3.9  CL 96  CO2 28  GLUCOSE 143*  BUN 16  CREATININE 1.10  CALCIUM 9.4   CBC:  Recent Labs  03/13/14 1142  WBC 17.1*  NEUTROABS 16.1*  HGB 14.4  HCT 43.0  MCV 95.3  PLT 127*   Cardiac Enzymes:  Recent Labs  03/13/14 1142  TROPONINI <0.03   Imaging results:  Dg Chest Port 1 View  03/13/2014   CLINICAL DATA:  Shortness of breath with worsening since Wednesday  EXAM: PORTABLE CHEST - 1 VIEW  COMPARISON:  CT chest 03/05/2014  FINDINGS: Cardiomediastinal silhouette is stable. Mild hyperinflation again noted. Stable chronic blunting of the right costophrenic angle. Again noted postsurgical changes from right upper lobectomy. No acute infiltrate or pulmonary edema.  IMPRESSION: No active disease. Stable postsurgical changes and chronic blunting of the right costophrenic angle per   Electronically Signed   By: Lahoma Crocker M.D.   On: 03/13/2014 12:53    Other results: EKG: Rate 111, sinus tachycardia with anterior infarct (unchanged since 06/18/13)  Assessment & Plan by Problem: Active Problems:   COPD exacerbation  Ms. Bahena is a 68 yo woman with a history of multiple COPD exacerbations who presents in acute COPD exacerbation. She has received multiple rounds of antibiotics and steroids over the past few months; ENT evaluation was negative. CXR showed chronic blunting of the right costophrenic angle with no active disease.  COPD Exacerbation (Gold Stage I): Followed by Dr. Elsworth Soho, [facility]. Patient is Gold Stage I. Had RUL lobectomy in 06/2011 for benign lesion. Had bronchoscopy in 01/2012 for LLL nodule and developed a left pneumothorax after the procedure; BAL showed m. cat which was treated. Has home O2 2L. Quit smoking. 05/2011 PFTs: DLCO 38%, FEV1 0.95-37%.  On home Advair 1 puff BID. Took two days of augmentin and 2 days of 40 mg prednisone over the past 2 days. On duonebs 4 times / day at home. - Starting levaquin 750 mg daily for COPD exacerbation in a woman >65 with dyspnea and no pseudomonas risk factors - IV solu-medrol 125 mg today; switch to 50 mg prednisone PO tomorrow to be followed by a taper - Duonebs q2 hours PRN - Dulera 2 puffs BID - O2 by [place] - BNP pending - Influenza pending  Leukocytosis: 17.1 on admission. Afebrile. Likely due to prednisone (has been taking it as an outpatient for 2 days). - Repeat once patient off of prednisone; trend fever curve   Malignant Neoplasm of Left Lower Lobe: Had bronchoscopy in 01/2012 for LLL nodule and developed a left pneumothorax after the procedure; BAL showed m. cat which was treated. Pulmonology notes state that she may need empiric RT if the nodule has grown, but her very recent CT chest showed no significant change in the LLL nodule.  - Follow up chest CT recommended in 1 year  Hypertension: Currently 99/57. On lasix 20 mg daily PRN and verapamil 180 mg daily. - Hold home verapamil and lasix in context of low BP; resume verapamil tomorrow  Anxiety: Recent notes (02/02/14) by Dr. Elsworth Soho reference a significant contribution from anxiety to her persistent dyspnea; prescribed Xanax TID. - Ativan 0.5 mg q6 hours PRN for anxiety or shortness of breath  Diet:  - Regular  DVT Ppx:  - Lovenox  Dispo: Disposition is deferred at this time, awaiting improvement of current medical problems. Anticipated discharge in approximately 1-2 day(s).   The patient does have a current PCP [name], DO) and does not need an [facility] hospital follow-up appointment after discharge.  The patient does not know have transportation limitations that hinder transportation to clinic appointments.  Signed: Drucilla Schmidt, MD 03/13/2014, 3:01 PM

## 2014-03-13 NOTE — ED Provider Notes (Signed)
CSN: [id number]     Arrival date & time 03/13/14  1046 History   First MD Initiated Contact with Patient 03/13/14 1123     Chief Complaint  Patient presents with  . Shortness of Breath     (Consider location/radiation/quality/duration/timing/severity/associated sxs/prior Treatment) HPI Comments: Patient presents to the ER for evaluation of shortness of breath. Patient reports that she has been feeling poorly for 3 days. She has been experiencing increasing nonproductive cough. She does report a previous history of COPD with O2 dependence. Patient was seen at urgent care today and reportedly had low oxygen saturations, was referred to the ER for further evaluation. She is not expressing any chest pain. There has not been any fever.  Patient is a 68 y.o. female presenting with shortness of breath.  Shortness of Breath Associated symptoms: cough     Past Medical History  Diagnosis Date  . COPD (chronic obstructive pulmonary disease)   . HTN (hypertension)   . B12 deficiency   . OA (osteoarthritis)   . Hypokalemia   . Mass of lung   . GERD (gastroesophageal reflux disease)   . Diverticulosis of colon (without mention of hemorrhage)   . 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 Pulmonary nodule    Past Surgical History  Procedure Laterality Date  . Carpal tunnel release      bilateral  . Tubal ligation      x 2  . Cervical spine surgery      plate with screws  . Lung surgery Right 07/06/11    RUL, per patient Dr Roxan Hockey  . Video bronchoscopy Bilateral 01/20/2013    Procedure: VIDEO BRONCHOSCOPY WITH FLUORO;  Surgeon: Rigoberto Noel, MD;  Location: [place];  Service: Cardiopulmonary;  Laterality: Bilateral;  . Cataract extraction w/phaco Left 11/30/2013    Procedure: CATARACT EXTRACTION PHACO AND INTRAOCULAR LENS PLACEMENT (IOC);  Surgeon: Tonny Branch, MD;  Location: [facility];  Service: Ophthalmology;  Laterality: Left;  CDE:  11.35   Family History  Problem  Relation Age of Onset  . Coronary artery disease Mother 69  . Alzheimer's disease Mother   . Colon cancer    . COPD Father    History  Substance Use Topics  . Smoking status: Former Smoker -- 1.00 packs/day for 40 years    Types: Cigarettes    Quit date: 06/30/2011  . Smokeless tobacco: Never Used     Comment: resumed for a short time  . Alcohol Use: No   OB History    No data available     Review of Systems  Respiratory: Positive for cough and shortness of breath.   All other systems reviewed and are negative.     Allergies  Aspirin and Percodan  Home Medications   Prior to Admission medications   Medication Sig Start Date End Date Taking? Authorizing Provider  acetaminophen (TYLENOL) 500 MG tablet Take 500 mg by mouth every 6 (six) hours as needed for moderate pain.    Yes Historical Provider, MD  albuterol (PROVENTIL) (2.5 MG/3ML) 0.083% nebulizer solution Take 2.5 mg by nebulization 4 (four) times daily. For shortness of breath 02/08/12  Yes Shanker Kristeen Mans, MD  Albuterol Sulfate (PROAIR RESPICLICK) 643 (90 BASE) MCG/ACT AEPB Inhale 2 puffs into the lungs every 4 (four) hours as needed. 03/08/14  Yes Rigoberto Noel, MD  ALPRAZolam Duanne Moron) 0.25 MG tablet Take 0.25 mg by mouth 2 (two) times daily. 07/21/13  Yes Rigoberto Noel, MD  amoxicillin-clavulanate (  AUGMENTIN) 875-125 MG per tablet Take 1 tablet by mouth 2 (two) times daily. 03/11/14  Yes Deneise Lever, MD  cyanocobalamin (,VITAMIN B-12,) 1000 MCG/ML injection Inject 1,000 mcg into the muscle every 30 (thirty) days. Given on the 10th of each month   Yes Historical Provider, MD  esomeprazole (NEXIUM) 20 MG capsule Take 20 mg by mouth daily at 12 noon.   Yes Historical Provider, MD  fluticasone (FLONASE) 50 MCG/ACT nasal spray Place 2 sprays into both nostrils as needed for allergies.  02/13/13  Yes Elsie Stain, MD  Fluticasone-Salmeterol (ADVAIR) 250-50 MCG/DOSE AEPB Inhale 1 puff into the lungs 2 (two) times  daily.    Yes Historical Provider, MD  furosemide (LASIX) 20 MG tablet TAKE ONE TO TWO TABLETS BY MOUTH ONCE DAILY AS NEEDED FOR FLUID OR EDEMA 03/08/14  Yes Rigoberto Noel, MD  guaiFENesin (MUCINEX) 600 MG 12 hr tablet Take 600 mg by mouth 2 (two) times daily.   Yes Historical Provider, MD  ipratropium (ATROVENT) 0.02 % nebulizer solution Take 2.5 mLs (0.5 mg total) by nebulization 4 (four) times daily. Dx 496 10/07/13  Yes Rigoberto Noel, MD  KLOR-CON M20 20 MEQ tablet Take 20 mEq by mouth daily.  05/11/11  Yes Historical Provider, MD  loratadine (CLARITIN) 10 MG tablet Take 1 tablet (10 mg total) by mouth daily. 02/08/12  Yes Shanker Kristeen Mans, MD  meloxicam (MOBIC) 15 MG tablet Take 15 mg by mouth daily.    Yes Historical Provider, MD  predniSONE (DELTASONE) 10 MG tablet Take 2 tabs daily w/ food x 5 days, 1 tab daily w/ food x 5 days then stop 02/02/14  Yes Rigoberto Noel, MD  verapamil (CALAN-SR) 180 MG CR tablet Take 180 mg by mouth daily.   Yes Historical Provider, MD  doxycycline (VIBRA-TABS) 100 MG tablet Take 1 tablet (100 mg total) by mouth daily. Patient not taking: Reported on 03/13/2014 02/15/14   Rigoberto Noel, MD   BP 112/68 mmHg  Pulse 61  Temp(Src) 98.1 F (36.7 C) (Oral)  Resp 24  SpO2 97% Physical Exam  Constitutional: She is oriented to person, place, and time. She appears well-developed and well-nourished. No distress.  HENT:  Head: Normocephalic and atraumatic.  Right Ear: Hearing normal.  Left Ear: Hearing normal.  Nose: Nose normal.  Mouth/Throat: Oropharynx is clear and moist and mucous membranes are normal.  Eyes: Conjunctivae and EOM are normal. Pupils are equal, round, and reactive to light.  Neck: Normal range of motion. Neck supple.  Cardiovascular: Regular rhythm, S1 normal and S2 normal.  Tachycardia present.  Exam reveals no gallop and no friction rub.   No murmur heard. Pulmonary/Chest: Accessory muscle usage present. No respiratory distress. She has  decreased breath sounds. She has wheezes. She exhibits no tenderness.  Abdominal: Soft. Normal appearance and bowel sounds are normal. There is no hepatosplenomegaly. There is no tenderness. There is no rebound, no guarding, no tenderness at McBurney's point and negative Murphy's sign. No hernia.  Musculoskeletal: Normal range of motion.  Neurological: She is alert and oriented to person, place, and time. She has normal strength. No cranial nerve deficit or sensory deficit. Coordination normal. GCS eye subscore is 4. GCS verbal subscore is 5. GCS motor subscore is 6.  Skin: Skin is warm, dry and intact. No rash noted. No cyanosis.  Psychiatric: She has a normal mood and affect. Her speech is normal and behavior is normal. Thought content normal.  Nursing note and  vitals reviewed.   ED Course  Procedures (including critical care time) Labs Review Labs Reviewed  CBC WITH DIFFERENTIAL - Abnormal; Notable for the following:    WBC 17.1 (*)    Platelets 127 (*)    Neutrophils Relative % 94 (*)    Neutro Abs 16.1 (*)    Lymphocytes Relative 2 (*)    Lymphs Abs 0.3 (*)    All other components within normal limits  BASIC METABOLIC PANEL - Abnormal; Notable for the following:    Glucose, Bld 143 (*)    GFR calc non Af Amer 50 (*)    GFR calc Af Amer 58 (*)    All other components within normal limits  TROPONIN I  BRAIN NATRIURETIC PEPTIDE    Imaging Review Dg Chest Port 1 View  03/13/2014   CLINICAL DATA:  Shortness of breath with worsening since Wednesday  EXAM: PORTABLE CHEST - 1 VIEW  COMPARISON:  CT chest 03/05/2014  FINDINGS: Cardiomediastinal silhouette is stable. Mild hyperinflation again noted. Stable chronic blunting of the right costophrenic angle. Again noted postsurgical changes from right upper lobectomy. No acute infiltrate or pulmonary edema.  IMPRESSION: No active disease. Stable postsurgical changes and chronic blunting of the right costophrenic angle per   Electronically  Signed   By: Lahoma Crocker M.D.   On: 03/13/2014 12:53     EKG Interpretation   Date/Time:  Saturday March 13 2014 10:51:55 EST Ventricular Rate:  111 PR Interval:  146 QRS Duration: 76 QT Interval:  320 QTC Calculation: 435 R Axis:   -134 Text Interpretation:  Sinus tachycardia with Fusion complexes Biatrial  enlargement Indeterminate axis Anterior infarct , age undetermined  Abnormal ECG No significant change since last tracing Confirmed by Morrison Masser   MD, [place] [phone]) on 03/13/2014 11:25:03 AM      MDM   Final diagnoses:  Shortness of breath  COPD exacerbation    Patient presents to the ER for evaluation of shortness of breath. Patient has a history of COPD. She sees Dr. Elsworth Soho, [facility] pulmonology. Patient has had symptoms for several days. She was initiated on prednisone and amoxicillin as an outpatient without improvement. She is also using her nebulizers without relief. Patient had significant wheezing, rhonchi upon initial examination. She has been tachycardic and hypoxic here in the ER. She was given Solu-Medrol and continuous nebulizer treatment with only minimal improvement. Patient has decided to 80% on supplemental oxygen with minimal ambulation here in the ER. She will therefore require hospitalization for further management of COPD exacerbation.    Orpah Greek, MD 03/13/14 [phone]

## 2014-03-13 NOTE — ED Notes (Signed)
Pt ambulated in restroom and upon returning to room via wheelchair SpO2 at 88% on 2L.

## 2014-03-14 DIAGNOSIS — N179 Acute kidney failure, unspecified: Secondary | ICD-10-CM

## 2014-03-14 DIAGNOSIS — I10 Essential (primary) hypertension: Secondary | ICD-10-CM

## 2014-03-14 DIAGNOSIS — D72829 Elevated white blood cell count, unspecified: Secondary | ICD-10-CM

## 2014-03-14 DIAGNOSIS — C3432 Malignant neoplasm of lower lobe, left bronchus or lung: Secondary | ICD-10-CM

## 2014-03-14 LAB — BASIC METABOLIC PANEL
Anion gap: 8 (ref 5–15)
BUN: 25 mg/dL — ABNORMAL HIGH (ref 6–23)
CHLORIDE: 100 meq/L (ref 96–112)
CO2: 31 mmol/L (ref 19–32)
CREATININE: 1.28 mg/dL — AB (ref 0.50–1.10)
Calcium: 9.2 mg/dL (ref 8.4–10.5)
GFR calc Af Amer: 49 mL/min — ABNORMAL LOW (ref >90)
GFR calc non Af Amer: 42 mL/min — ABNORMAL LOW (ref >90)
Glucose, Bld: 114 mg/dL — ABNORMAL HIGH (ref 70–99)
POTASSIUM: 4.1 mmol/L (ref 3.5–5.1)
Sodium: 139 mmol/L (ref 135–145)

## 2014-03-14 LAB — CBC
HCT: 39.8 % (ref 36.0–46.0)
HEMOGLOBIN: 13 g/dL (ref 12.0–15.0)
MCH: 31.6 pg (ref 26.0–34.0)
MCHC: 32.7 g/dL (ref 30.0–36.0)
MCV: 96.6 fL (ref 78.0–100.0)
Platelets: 127 10*3/uL — ABNORMAL LOW (ref 150–400)
RBC: 4.12 MIL/uL (ref 3.87–5.11)
RDW: 13.4 % (ref 11.5–15.5)
WBC: 14.5 10*3/uL — ABNORMAL HIGH (ref 4.0–10.5)

## 2014-03-14 MED ORDER — WHITE PETROLATUM GEL
Status: AC
  Administered 2014-03-14: 0.2
  Filled 2014-03-14: qty 5

## 2014-03-14 MED ORDER — SODIUM CHLORIDE 0.9 % IV BOLUS (SEPSIS)
500.0000 mL | Freq: Once | INTRAVENOUS | Status: AC
  Administered 2014-03-14: 500 mL via INTRAVENOUS

## 2014-03-14 MED ORDER — METHYLPREDNISOLONE SODIUM SUCC 40 MG IJ SOLR
40.0000 mg | Freq: Four times a day (QID) | INTRAMUSCULAR | Status: DC
  Administered 2014-03-14 – 2014-03-15 (×6): 40 mg via INTRAVENOUS
  Filled 2014-03-14 (×8): qty 1

## 2014-03-14 MED ORDER — MAGIC MOUTHWASH
2.0000 mL | Freq: Three times a day (TID) | ORAL | Status: DC | PRN
  Filled 2014-03-14: qty 5

## 2014-03-14 NOTE — Discharge Summary (Signed)
Name: Shawna [name] MRN: [id number] DOB: [birth year] 68 y.o. PCP: [name], DO  Date of Admission: 03/13/2014 11:19 AM Date of Discharge: 03/18/2014 Attending Physician: Bertha Stakes, MD  Discharge Diagnosis: Acute COPD exacerbation Anxiety  Discharge Medications:   Medication List    STOP taking these medications        amoxicillin-clavulanate 875-125 MG per tablet  Commonly known as:  AUGMENTIN     doxycycline 100 MG tablet  Commonly known as:  VIBRA-TABS      TAKE these medications        acetaminophen 500 MG tablet  Commonly known as:  TYLENOL  Take 500 mg by mouth every 6 (six) hours as needed for moderate pain.     albuterol (2.5 MG/3ML) 0.083% nebulizer solution  Commonly known as:  PROVENTIL  Take 2.5 mg by nebulization 4 (four) times daily. For shortness of breath     Albuterol Sulfate 108 (90 BASE) MCG/ACT Aepb  Commonly known as:  PROAIR RESPICLICK  Inhale 2 puffs into the lungs every 4 (four) hours as needed.     ALPRAZolam 0.25 MG tablet  Commonly known as:  XANAX  Take 0.25 mg by mouth 2 (two) times daily.     cyanocobalamin 1000 MCG/ML injection  Commonly known as:  (VITAMIN B-12)  Inject 1,000 mcg into the muscle every 30 (thirty) days. Given on the 10th of each month     esomeprazole 20 MG capsule  Commonly known as:  NEXIUM  Take 20 mg by mouth daily at 12 noon.     fluticasone 50 MCG/ACT nasal spray  Commonly known as:  FLONASE  Place 2 sprays into both nostrils as needed for allergies.     Fluticasone-Salmeterol 250-50 MCG/DOSE Aepb  Commonly known as:  ADVAIR  Inhale 1 puff into the lungs 2 (two) times daily.     furosemide 20 MG tablet  Commonly known as:  LASIX  TAKE ONE TO TWO TABLETS BY MOUTH ONCE DAILY AS NEEDED FOR FLUID OR EDEMA     guaiFENesin 600 MG 12 hr tablet  Commonly known as:  MUCINEX  Take 1 tablet (600 mg total) by mouth 2 (two) times daily as needed.     ipratropium 0.02 % nebulizer solution  Commonly  known as:  ATROVENT  Take 2.5 mLs (0.5 mg total) by nebulization 4 (four) times daily. Dx 496     KLOR-CON M20 20 MEQ tablet  Generic drug:  potassium chloride SA  Take 20 mEq by mouth daily.     loratadine 10 MG tablet  Commonly known as:  CLARITIN  Take 1 tablet (10 mg total) by mouth daily.     meloxicam 15 MG tablet  Commonly known as:  MOBIC  Take 15 mg by mouth daily.     morphine 10 MG/5ML solution  Take 2.5-5 mLs (5-10 mg total) by mouth every 4 (four) hours as needed (shortness of breath).     predniSONE 10 MG tablet  Commonly known as:  DELTASONE  Take 4 tablets x 5 days, 2 tablets x 5 days, 1 tablet thereafter.     senna-docusate 8.6-50 MG per tablet  Commonly known as:  Senokot-S  Take 1 tablet by mouth at bedtime as needed for mild constipation.     verapamil 180 MG CR tablet  Commonly known as:  CALAN-SR  Take 180 mg by mouth daily.        Disposition and follow-up:   Ms.Alliene H Duskin was discharged from  [facility] in Stable condition.  At the hospital follow up visit please address:  1. Acute COPD exacerbation: resolution of symptoms, adherence to prednisone  2. Dyspnea: tolerance of liquid morphine solution (will need refills)  3. Anxiety: adjustment of Xanax  4. RLL Neoplasm: To receive repeat chest CT in 1 year.  5. Change to DNR/DNI  6. Labs / imaging needed at time of follow-up: none  7. Pending labs/ test needing follow-up: none  Follow-up Appointments: Follow-up Information    Follow up with Rigoberto Noel., MD.   Specialty:  Pulmonary Disease   Contact information:   [address] [address] [phone]       Follow up with Concord, Linton, DO On 03/23/2014.   Why:  At 2.30pm   Contact information:   [address] [place] [address] [phone]       Follow up with PARRETT,TAMMY, NP On 04/08/2014.   Specialty:  Nurse Practitioner   Why:  10 am   Contact information:   [address] [address] [phone]       Discharge Instructions: Discharge Instructions    Call MD for:  difficulty breathing, headache or visual disturbances    Complete by:  As directed      Call MD for:  persistant nausea and vomiting    Complete by:  As directed      Call MD for:  redness, tenderness, or signs of infection (pain, swelling, redness, odor or green/yellow discharge around incision site)    Complete by:  As directed      Call MD for:  severe uncontrolled pain    Complete by:  As directed      Call MD for:  temperature >100.4    Complete by:  As directed      Diet - low sodium heart healthy    Complete by:  As directed      Increase activity slowly    Complete by:  As directed            Consultations: Treatment Team:  Palliative [facility]  Procedures Performed:  Dg Chest 2 View  03/15/2014   CLINICAL DATA:  Chest pain, shortness of breath.  EXAM: CHEST  2 VIEW  COMPARISON:  Chest radiograph 03/13/2014  FINDINGS: Stable cardiac and mediastinal contours. Postoperative changes involving the right hemi thorax. Minimal left basilar atelectasis. No definite pleural effusion or pneumothorax. Mid thoracic spine degenerative changes. Postoperative changes involving the right hemi thorax.  IMPRESSION: No acute cardiopulmonary process.  Left basilar atelectasis.   Electronically Signed   By: Lovey Newcomer M.D.   On: 03/15/2014 20:28   Ct Chest Wo Contrast  03/05/2014   CLINICAL DATA:  Followup left lower lobe pulmonary nodule  EXAM: CT CHEST WITHOUT CONTRAST  TECHNIQUE: Multidetector CT imaging of the chest was performed following the standard protocol without IV contrast.  COMPARISON:  10/29/2013, 08/06/2012, 02/04/2012, PET-CT 05/22/2011  FINDINGS: 6 mm AP window lymph node is stable. Heart size normal. Moderate atheromatous aortic and coronary arterial calcifications are noted. Clips from right upper lobectomy reidentified. No mass lesion at the resection margin.  No pericardial or pleural effusion.  Incomplete imaging of the upper abdomen demonstrates normal-appearing adrenal glands.  Severe emphysematous changes are reidentified. Medial aspect superior segment left lower lobe pulmonary nodule measuring 1.6 x 1.0 cm is stable. No new pulmonary nodule, mass, or consolidation. No pneumothorax. Central airways are patent.  Right humeral bone island  reidentified. No lytic or sclerotic osseous lesion. No acute osseous abnormality.  IMPRESSION: No significant change in left lower lobe pulmonary nodule, a portion of which likely represents volume averaging from adjacent left infrahilar lymph node, allowing for lack of contrast for improved differentiation. Followup chest CT is recommended in 1 year.   Electronically Signed   By: Conchita Paris M.D.   On: 03/05/2014 13:29   Dg Chest Port 1 View  03/13/2014   CLINICAL DATA:  Shortness of breath with worsening since Wednesday  EXAM: PORTABLE CHEST - 1 VIEW  COMPARISON:  CT chest 03/05/2014  FINDINGS: Cardiomediastinal silhouette is stable. Mild hyperinflation again noted. Stable chronic blunting of the right costophrenic angle. Again noted postsurgical changes from right upper lobectomy. No acute infiltrate or pulmonary edema.  IMPRESSION: No active disease. Stable postsurgical changes and chronic blunting of the right costophrenic angle per   Electronically Signed   By: Lahoma Crocker M.D.   On: 03/13/2014 12:53    Admission HPI:  Ms. 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 This episode reminds her of other hospitalizations she has had for COPD exacerbation. Her shortness of breath is present when seated, but worse when she walks. She has a cough that is not productive of sputum and denies chills, fever or headache. She started Augmentin and prednisone (40 mg) two days ago  and also used her home Advair nebulizer every 4 hours, but her shortness of breath persisted. Of note, her pulmonologist, Dr. Elsworth Soho, recently prescribed Xanax 0.25 TID and commented that a component of anxiety may be contributing to her dyspnea; she has been taking Xanax BID.   Hospital Course by problem list:   COPD Exacerbation (Gold Stage 4): Her symptoms improved on Duonebs, Symbicort, IV solumedrol, and Levaquin x 5 days. Viral panel & influenza screen were negative. She was educated about the natural progression of her disease and her current severity, so Palliative Care was consulted for symptom management as well as goals of care. Her dyspnea improved with Roxanol 5-10mg  q4h prn, and she elected to change her code status to DNR/DNI. She was discharged on prednisone 40mg  x 5 days, 20mg  x 5 days, 10mg  thereafter and scheduled for follow-up with her pulmonologist, Dr. Elsworth Soho.   Leukocytosis: Likely 2/2 steroids. 17.1 on admission through trended down to 11.3. She remained afebrile throughout her hospital course.   Anxiety: She was continued on Xanax 0.25mg  BID as it was felt that there was a component of anxiety which may have contributed to her condition.  LLL nodule: Most recent CT chest 03/05/14 showed stable 1.6 cm x 1.0 cm stable LLL nodule and recommended to follow-up in 1 year.  Acute kidney injury: Crt increased to 1.3 but then trended down to 1.0 (baseline 1.0-1.2) with IV hydration. She was tolerating PO intake at the time of discharge.  HTN: She was continued on home verapamil as BP trended mainly 140-160/80s. Home Lasix was restarted on discharge.   Discharge Vitals:   BP 154/82 mmHg  Pulse 78  Temp(Src) 98 F (36.7 C) (Oral)  Resp 18  Ht 5\' 6"  (1.676 m)  Wt 120 lb (54.432 kg)  BMI 19.38 kg/m2  SpO2 98%  Discharge Labs:  No results found for this or any previous visit (from the past 24 hour(s)).  Signed: Charlott Rakes, MD 03/23/2014, 8:38  AM    Services Ordered on  Discharge: none Equipment Ordered on Discharge: none

## 2014-03-14 NOTE — Progress Notes (Addendum)
Subjective: Shawna [name] feels "about the same" this morning, though her pleuritic chest pain is decreased. She continues to feel short of breath. Believes she may have some general anxiety, but nothing is worrying her at the moment. She also thinks she is developing thrush (feels some discomfort under tongue).  Objective: Vital signs in last 24 hours: Filed Vitals:   03/14/14 0021 03/14/14 0352 03/14/14 0534 03/14/14 0856  BP:   131/72   Pulse:   106   Temp:   98 F (36.7 C)   TempSrc:   Oral   Resp:   22   SpO2: 97% 97% 96% 96%   Weight change:   Intake/Output Summary (Last 24 hours) at 03/14/14 1101 Last data filed at 03/14/14 1018  Gross per 24 hour  Intake    240 ml  Output      3 ml  Net    237 ml   Physical Exam: Appearance: in NAD, lying in bed receiving nebulizer treatment (then 2L [place] O2 put back in place), tremor at baseline obvious in hands and tongue, appears anxious HEENT: AT/[place], PERRL, EOMi, no lymphadenopathy, no signs of thrush Heart: tachycardic, normal rhythm, normal S1S2, no MRG Lungs: scant expiratory wheezes R>L Abdomen: thin, BS+, soft, nontender  Musculoskeletal: normal range of motion Extremities: no edema BLE Neurologic: A&Ox3, grossly intact Skin: nontender red hematomas on arms b/l  Lab Results: Basic Metabolic Panel:  Recent Labs Lab 03/13/14 1142 03/14/14 0523  NA 135 139  K 3.9 4.1  CL 96 100  CO2 28 31  GLUCOSE 143* 114*  BUN 16 25*  CREATININE 1.10 1.28*  CALCIUM 9.4 9.2   CBC:  Recent Labs Lab 03/13/14 1142 03/14/14 0523  WBC 17.1* 14.5*  NEUTROABS 16.1*  --   HGB 14.4 13.0  HCT 43.0 39.8  MCV 95.3 96.6  PLT 127* 127*   Cardiac Enzymes:  Recent Labs Lab 03/13/14 1142  TROPONINI <0.03   Studies/Results: Dg Chest Port 1 View  03/13/2014   CLINICAL DATA:  Shortness of breath with worsening since Wednesday  EXAM: PORTABLE CHEST - 1 VIEW  COMPARISON:  CT chest 03/05/2014  FINDINGS: Cardiomediastinal silhouette is  stable. Mild hyperinflation again noted. Stable chronic blunting of the right costophrenic angle. Again noted postsurgical changes from right upper lobectomy. No acute infiltrate or pulmonary edema.  IMPRESSION: No active disease. Stable postsurgical changes and chronic blunting of the right costophrenic angle per   Electronically Signed   By: Lahoma Crocker M.D.   On: 03/13/2014 12:53   Medications: I have reviewed the patient's current medications. Scheduled Meds: . enoxaparin (LOVENOX) injection  40 mg Subcutaneous Q24H  . ipratropium-albuterol  3 mL Nebulization Q4H  . levofloxacin  750 mg Oral Daily  . methylPREDNISolone (SOLU-MEDROL) injection  40 mg Intravenous Q6H  . mometasone-formoterol  2 puff Inhalation BID  . pantoprazole  40 mg Oral Daily  . verapamil  180 mg Oral Daily   Continuous Infusions:  PRN Meds:.acetaminophen, ipratropium-albuterol, LORazepam, magic mouthwash Assessment/Plan: Active Problems:   COPD exacerbation  Shawna [name] is a 68 yo woman with a history of multiple COPD exacerbations who presented in acute COPD exacerbation that failed outapatient nebulizer, augmentin and prednisone. She has received multiple rounds of antibiotics and steroids over the past few months. CXR showed chronic blunting of the right costophrenic angle with no active disease. Patient is stable from last night, but did not show significant improvement on IV Solumedrol and antibiotics.  COPD Exacerbation (Gold  Stage I): Followed by Dr. Elsworth Soho, [facility]. Patient is Gold Stage I. Had RUL lobectomy in 06/2011 for benign lesion. Had bronchoscopy in 01/2012 for LLL nodule and developed a left pneumothorax after the procedure; BAL showed m. cat which was treated. Has home O2 2L. Quit smoking. 05/2011 PFTs: DLCO 38%, FEV1 0.95-37%. On home Advair 1 puff BID. Took two days of augmentin and 2 days of 40 mg prednisone over the 2 days prior to admission. Takes prednisone at baseline (10 mg most days). On  duonebs 4 times / day at home. BNP WNL. - Continue levaquin 750 mg daily for COPD exacerbation in a woman >65 with dyspnea and no pseudomonas risk factors - Continue IV solu-medrol 125 mg today; consider switch to 50 mg prednisone PO tomorrow to be followed by a taper - Duonebs q2 hours PRN - Dulera 2 puffs BID - O2 by [place] - Influenza pending  Anxiety: Recent notes (02/02/14) by Dr. Elsworth Soho reference a significant contribution from anxiety to her persistent dyspnea; prescribed Xanax TID. - Ativan 0.5 mg q6 hours PRN for anxiety or shortness of breath; spoke with patient about importance of asking for this when anxious  AKI: Patient's creatinine rose from 1.1 to 1.28 overnight. Likely pre-renal. - 500 ml bolus NS - Continue to trend creatinine  ?Thrush: No sign of thrush on exam, but patient has had multiple episodes in the past, feels that thrush is developing under her tongue. On steroids/nebs. - Magic mouthwash   Leukocytosis: 17.1-->14.5. Afebrile. Likely secondary to prednisone. - Repeat once patient off of prednisone; trend fever curve   Malignant Neoplasm of Left Lower Lobe: Had bronchoscopy in 01/2012 for LLL nodule and developed a left pneumothorax after the procedure; BAL showed m. cat which was treated. Pulmonology notes state that she may need empiric RT if the nodule has grown, but her very recent CT chest showed no significant change in the LLL nodule.  - Follow up chest CT recommended in 1 year  Hypertension: Currently 131/72. On lasix 20 mg daily PRN and verapamil 180 mg daily. - Hold home PRN lasix - Resume verapamil 180 mg daily  Diet:  - Regular  DVT Ppx:  - Lovenox  Dispo: Disposition is deferred at this time, awaiting improvement of current medical problems.  Anticipated discharge in approximately 1 day(s).   The patient does have a current PCP Shawna Graves, DO) and does not need an [facility] hospital follow-up appointment after discharge.  The patient does not  have transportation limitations that hinder transportation to clinic appointments.  .Services Needed at time of discharge: Y = Yes, Blank = No PT:   OT:   RN:   Equipment:   Other:     LOS: 1 day   Drucilla Schmidt, MD 03/14/2014, 11:01 AM

## 2014-03-14 NOTE — Progress Notes (Signed)
Pt seen and examined with Dr. Sherrine Maples.  In brief, patient is a 68 y/o female with PMH of HTN, LLL neoplasm, COPD stage 1 with frequent exacerbations who p/w SOB *3 days. Pt states it has worsened over the past 3 days and is exacerbated by exertion. Pt also c/o assoc cough which is non productive. No fevers, no chills no CP, no palpitations, no diaphoresis, no lightheadedness, no syncope. Pt was started on augmentin and prednisone 40 mg 2 days ago with no improvement in her symptoms.   Physical Exam: Gen: AAO*3, NAD CVS: tachycardic, normal heart sounds Pulm: scattered b/l end expiratory wheezes + Abd: soft, non tender, BS + Ext: no edema  Assessment and Plan: 68 y/o female with acute COPD exacerbation that failed outpatient therapy  Acute COPD excaerbation: - Will c/w solumedrol 40 mg IV q 6 hrs today - Will attempt to switch to Prednisone in AM - C/w levaquin PO  - c/w nebs ATC, dulera - Influenza negative  AKI: - likely prerenal - Will give IVF today - Will monitor

## 2014-03-15 ENCOUNTER — Inpatient Hospital Stay (HOSPITAL_COMMUNITY): Payer: Medicare Other

## 2014-03-15 DIAGNOSIS — R0602 Shortness of breath: Secondary | ICD-10-CM | POA: Diagnosis present

## 2014-03-15 DIAGNOSIS — J9620 Acute and chronic respiratory failure, unspecified whether with hypoxia or hypercapnia: Secondary | ICD-10-CM

## 2014-03-15 DIAGNOSIS — F419 Anxiety disorder, unspecified: Secondary | ICD-10-CM

## 2014-03-15 DIAGNOSIS — J441 Chronic obstructive pulmonary disease with (acute) exacerbation: Principal | ICD-10-CM

## 2014-03-15 DIAGNOSIS — J9621 Acute and chronic respiratory failure with hypoxia: Secondary | ICD-10-CM

## 2014-03-15 LAB — BASIC METABOLIC PANEL
Anion gap: 8 (ref 5–15)
BUN: 29 mg/dL — ABNORMAL HIGH (ref 6–23)
CALCIUM: 9.5 mg/dL (ref 8.4–10.5)
CO2: 32 mmol/L (ref 19–32)
CREATININE: 1.12 mg/dL — AB (ref 0.50–1.10)
Chloride: 100 mEq/L (ref 96–112)
GFR calc non Af Amer: 49 mL/min — ABNORMAL LOW (ref >90)
GFR, EST AFRICAN AMERICAN: 57 mL/min — AB (ref >90)
Glucose, Bld: 123 mg/dL — ABNORMAL HIGH (ref 70–99)
POTASSIUM: 3.7 mmol/L (ref 3.5–5.1)
Sodium: 140 mmol/L (ref 135–145)

## 2014-03-15 LAB — BLOOD GAS, ARTERIAL
Acid-Base Excess: 4.5 mmol/L — ABNORMAL HIGH (ref 0.0–2.0)
BICARBONATE: 28.8 meq/L — AB (ref 20.0–24.0)
Drawn by: 36277
O2 CONTENT: 2 L/min
O2 Saturation: 93.9 %
PATIENT TEMPERATURE: 98.6
PH ART: 7.424 (ref 7.350–7.450)
TCO2: 30.1 mmol/L (ref 0–100)
pCO2 arterial: 44.8 mmHg (ref 35.0–45.0)
pO2, Arterial: 72 mmHg — ABNORMAL LOW (ref 80.0–100.0)

## 2014-03-15 LAB — CBC
HCT: 40.1 % (ref 36.0–46.0)
Hemoglobin: 12.9 g/dL (ref 12.0–15.0)
MCH: 31.2 pg (ref 26.0–34.0)
MCHC: 32.2 g/dL (ref 30.0–36.0)
MCV: 96.9 fL (ref 78.0–100.0)
PLATELETS: 156 10*3/uL (ref 150–400)
RBC: 4.14 MIL/uL (ref 3.87–5.11)
RDW: 13.3 % (ref 11.5–15.5)
WBC: 11.3 10*3/uL — ABNORMAL HIGH (ref 4.0–10.5)

## 2014-03-15 LAB — D-DIMER, QUANTITATIVE: D-Dimer, Quant: 0.45 ug/mL-FEU (ref 0.00–0.48)

## 2014-03-15 LAB — BRAIN NATRIURETIC PEPTIDE: B Natriuretic Peptide: 272.4 pg/mL — ABNORMAL HIGH (ref 0.0–100.0)

## 2014-03-15 LAB — TROPONIN I: Troponin I: <0.03 ng/mL (ref <0.031)

## 2014-03-15 MED ORDER — SODIUM CHLORIDE 0.9 % IV BOLUS (SEPSIS)
500.0000 mL | Freq: Once | INTRAVENOUS | Status: AC
  Administered 2014-03-15: 500 mL via INTRAVENOUS

## 2014-03-15 MED ORDER — BUDESONIDE 0.25 MG/2ML IN SUSP
0.2500 mg | Freq: Two times a day (BID) | RESPIRATORY_TRACT | Status: DC
  Administered 2014-03-15 – 2014-03-18 (×6): 0.25 mg via RESPIRATORY_TRACT
  Filled 2014-03-15 (×8): qty 2

## 2014-03-15 MED ORDER — METHYLPREDNISOLONE SODIUM SUCC 125 MG IJ SOLR
60.0000 mg | Freq: Four times a day (QID) | INTRAMUSCULAR | Status: DC
  Administered 2014-03-15 – 2014-03-17 (×7): 60 mg via INTRAVENOUS
  Filled 2014-03-15 (×9): qty 0.96

## 2014-03-15 MED ORDER — SODIUM CHLORIDE 0.9 % IV BOLUS (SEPSIS): 1000.0000 mL | Freq: Once | INTRAVENOUS | Status: DC

## 2014-03-15 MED ORDER — LEVOFLOXACIN 750 MG PO TABS
750.0000 mg | ORAL_TABLET | ORAL | Status: DC
  Administered 2014-03-17: 750 mg via ORAL
  Filled 2014-03-15: qty 1

## 2014-03-15 MED ORDER — ALPRAZOLAM 0.25 MG PO TABS
0.2500 mg | ORAL_TABLET | Freq: Two times a day (BID) | ORAL | Status: DC
  Administered 2014-03-15 – 2014-03-18 (×7): 0.25 mg via ORAL
  Filled 2014-03-15 (×7): qty 1

## 2014-03-15 NOTE — Progress Notes (Signed)
Subjective: She feels a bit better but is concerned that she may not be able to afford her hospitalizations. As such, she would prefer to go home.   Objective: Vital signs in last 24 hours: Filed Vitals:   03/15/14 0519 03/15/14 1011 03/15/14 1232 03/15/14 1420  BP: 123/68   132/89  Pulse: 61   103  Temp: 98.1 F (36.7 C)   98.3 F (36.8 C)  TempSrc: Oral     Resp: 19   20  SpO2: 98% 93% 99% 100%   Weight change:   Intake/Output Summary (Last 24 hours) at 03/15/14 1520 Last data filed at 03/14/14 1700  Gross per 24 hour  Intake    240 ml  Output      0 ml  Net    240 ml   Physical Exam: Appearance: in NAD, wearing 2L [place], anxious-appearing HEENT: AT/[place], PERRL, EOMi, no lymphadenopathy, oropharynx clear of exudates Heart: tachycardic, normal rhythm, normal S1/S2 Lungs: expiratory wheezes R>L Abdomen: thin, BS+, soft, nontender  Musculoskeletal: normal range of motion Extremities: no edema BLE Neurologic: A&Ox3, grossly intact Skin: nontender red hematomas on arms b/l  Lab Results: Basic Metabolic Panel:  Recent Labs Lab 03/14/14 0523 03/15/14 1040  NA 139 140  K 4.1 3.7  CL 100 100  CO2 31 32  GLUCOSE 114* 123*  BUN 25* 29*  CREATININE 1.28* 1.12*  CALCIUM 9.2 9.5   CBC:  Recent Labs Lab 03/13/14 1142 03/14/14 0523 03/15/14 1040  WBC 17.1* 14.5* 11.3*  NEUTROABS 16.1*  --   --   HGB 14.4 13.0 12.9  HCT 43.0 39.8 40.1  MCV 95.3 96.6 96.9  PLT 127* 127* 156   Cardiac Enzymes:  Recent Labs Lab 03/13/14 1142  TROPONINI <0.03   Studies/Results: No results found. Medications: I have reviewed the patient's current medications. Scheduled Meds: . ALPRAZolam  0.25 mg Oral BID  . enoxaparin (LOVENOX) injection  40 mg Subcutaneous Q24H  . ipratropium-albuterol  3 mL Nebulization Q4H  . [START ON 03/17/2014] levofloxacin  750 mg Oral Q48H  . methylPREDNISolone (SOLU-MEDROL) injection  40 mg Intravenous Q6H  . mometasone-formoterol  2 puff  Inhalation BID  . pantoprazole  40 mg Oral Daily  . verapamil  180 mg Oral Daily   Continuous Infusions:  PRN Meds:.acetaminophen, ipratropium-albuterol, magic mouthwash Assessment/Plan: Active Problems:   COPD exacerbation  Ms. Boxell is a 68 year old woman with COPD (Gold Stage 3, 2L O2 at home), anxiety hospitalized for acute COPD exacerbation with persistent dyspnea.  COPD Exacerbation (Gold Stage 3): Followed by Dr. Elsworth Soho, [facility] who feels she feels her dyspnea may also be related to underlying anxiety. Influenza negative. - Continue levaquin 750 mg daily - Continue IV solumedrol 40mg  q6h - Duonebs q2 hours PRN - Dulera 2 puffs BID - Consider consulting Pulmonology if she does not improve - Repeat CXR  Anxiety: As noted above. - Switch Ativan 0.5 mg q6 hours PRN for anxiety or shortness of breath to Xanax 0.25mg  BID  AKI: Crt 1.1, down from 1.3 yesterday though BUN/Crt ratio > 20.   -Give another 1L bolus -Continue monitoring  Possible thrush: Increased likelihood given chronic steroids/nebs though physical exam findings reassuring.  - Continue watching.  Leukocytosis: 11.3, down from 14.5. Afebrile. Likely steroid effect. - Continue monitoring  Malignant Neoplasm of Left Lower Lobe: No significant change on most recent CT (12/18).  - Follow up chest CT recommended in 1 year  Hypertension: BP trending 100-130/60-70.  -  Hold home PRN lasix - Resume verapamil 180 mg daily  Diet:  - Regular  DVT Ppx:  - Lovenox  Dispo: Disposition is deferred at this time, awaiting improvement of current medical problems.  Anticipated discharge in approximately 1 day(s).   The patient does have a current PCP [name], DO) and does not need an [facility] hospital follow-up appointment after discharge.  The patient does not have transportation limitations that hinder transportation to clinic appointments.  .Services Needed at time of discharge: Y = Yes, Blank = No PT:    OT:   RN:   Equipment:   Other:     LOS: 2 days   Charlott Rakes, MD 03/15/2014, 3:20 PM

## 2014-03-15 NOTE — Progress Notes (Signed)
RN asked Internal Medicine concerns regarding patients home medications not being given to her while in the hospital. MD informed RN that the morning rounding team will follow up with that concern as the patient may be discharged on today. Nursing will continue to monitor.

## 2014-03-15 NOTE — Consult Note (Signed)
Name: Shawna [name] MRN: [id number] DOB: Aug 18, 1945    ADMISSION DATE:  03/13/2014 CONSULTATION DATE:  03/15/2014  REFERRING MD :  Bertha Stakes  CHIEF COMPLAINT:  Short of breath  BRIEF PATIENT DESCRIPTION:  68 yo female former smoker admitted with progressive dyspnea, non productive cough, and pleuritic chest pain felt to be from AECOPD.  Failed to improved, and PCCM consulted.  Followed by Dr. 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  SIGNIFICANT EVENTS   07/31/13 PFT >> FEV1 0.95 (37%), FEV1% 40, TLC 6.43 (120%), DLCO 38%, + BD response 03/05/14 CT chest >> moderate CAD, s/p RULectomy, severe emphysema, 1.6 cm nodule LLL no change 12/24 Started prednisone, augmentin as outpt ....................................... 12/26 To urgent care for dyspnea > low oxygen saturation > to ER and admitted 03/13/14 Influenza PCR >> negative   HISTORY OF PRESENT ILLNESS:   She is followed in pulmonary office by Dr. Elsworth Soho.  She developed worsening dyspnea and called office on 03/11/14.  She had started prednisone as an outpt, and had script for augmentin called in.  She was using her albuterol nebulizer every 4 hours with only partial improvement.  She presented to the ER on 03/13/14 with progressive dyspnea and non productive cough.  She was noted to have low oxygen level.  She was not having fever, chills, or headache.  She was noted to have pleuritic chest pain also.  Her chest xray was unrevealing on admission.  She was admitted by IMTS.  She was started on supplemental oxygen, solumedrol, levaquin, and scheduled bronchodilator therapy.  She was also started on scheduled xanax due to issues with anxiety contributing to dyspnea.    She continue to have dyspnea, and did not feel like therapy was improving her symptoms.  As a result pulmonary consultation was  requested.  In addition she also states - she is on o2 x 2 year. LAst 1 year progressive decline in effort tolerance - class 3 dyspneaa with further worsening since august 2015 and now with AECOPD - dyspnea is class 4. 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  has past surgical history that includes Carpal tunnel release; Tubal ligation; Cervical spine surgery; Lung surgery (Right, 07/06/11); Video bronchoscopy (Bilateral, 01/20/2013); and Cataract extraction w/PHACO (Left, 11/30/2013). Prior to Admission medications   Medication Sig Start Date End Date Taking? Authorizing Provider  acetaminophen (TYLENOL) 500 MG tablet Take 500 mg by mouth every 6 (six) hours as needed for moderate pain.    Yes Historical Provider, MD  albuterol (PROVENTIL) (2.5 MG/3ML) 0.083% nebulizer solution Take 2.5 mg by nebulization 4 (four) times daily. For shortness of breath 02/08/12  Yes Shanker Kristeen Mans, MD  Albuterol Sulfate (PROAIR RESPICLICK) 578 (90 BASE) MCG/ACT AEPB Inhale 2 puffs into the lungs every 4 (four) hours as needed. 03/08/14  Yes Rigoberto Noel, MD  ALPRAZolam Duanne Moron) 0.25 MG tablet Take 0.25 mg by mouth 2 (two) times daily. 07/21/13  Yes Rigoberto Noel, MD  amoxicillin-clavulanate (AUGMENTIN) 875-125 MG per tablet Take 1 tablet by mouth 2 (two) times daily. 03/11/14  Yes Deneise Lever, MD  cyanocobalamin (,  VITAMIN B-12,) 1000 MCG/ML injection Inject 1,000 mcg into the muscle every 30 (thirty) days. Given on the 10th of each month   Yes Historical Provider, MD  esomeprazole (NEXIUM) 20 MG capsule Take 20 mg by mouth daily at 12 noon.   Yes Historical  Provider, MD  fluticasone (FLONASE) 50 MCG/ACT nasal spray Place 2 sprays into both nostrils as needed for allergies.  02/13/13  Yes Elsie Stain, MD  Fluticasone-Salmeterol (ADVAIR) 250-50 MCG/DOSE AEPB Inhale 1 puff into the lungs 2 (two) times daily.    Yes Historical Provider, MD  furosemide (LASIX) 20 MG tablet TAKE ONE TO TWO TABLETS BY MOUTH ONCE DAILY AS NEEDED FOR FLUID OR EDEMA 03/08/14  Yes Rigoberto Noel, MD  guaiFENesin (MUCINEX) 600 MG 12 hr tablet Take 600 mg by mouth 2 (two) times daily.   Yes Historical Provider, MD  ipratropium (ATROVENT) 0.02 % nebulizer solution Take 2.5 mLs (0.5 mg total) by nebulization 4 (four) times daily. Dx 496 10/07/13  Yes Rigoberto Noel, MD  KLOR-CON M20 20 MEQ tablet Take 20 mEq by mouth daily.  05/11/11  Yes Historical Provider, MD  loratadine (CLARITIN) 10 MG tablet Take 1 tablet (10 mg total) by mouth daily. 02/08/12  Yes Shanker Kristeen Mans, MD  meloxicam (MOBIC) 15 MG tablet Take 15 mg by mouth daily.    Yes Historical Provider, MD  predniSONE (DELTASONE) 10 MG tablet Take 2 tabs daily w/ food x 5 days, 1 tab daily w/ food x 5 days then stop 02/02/14  Yes Rigoberto Noel, MD  verapamil (CALAN-SR) 180 MG CR tablet Take 180 mg by mouth daily.   Yes Historical Provider, MD  doxycycline (VIBRA-TABS) 100 MG tablet Take 1 tablet (100 mg total) by mouth daily. Patient not taking: Reported on 03/13/2014 02/15/14   Rigoberto Noel, MD   Allergies  Allergen Reactions  . Aspirin Swelling    Lips and face swelled.  . Percodan [Oxycodone-Aspirin] Other (See Comments)    "went out of my head"    FAMILY HISTORY:  family history includes Alzheimer's disease in her mother; COPD in her father; Colon cancer in an other family member; Coronary artery disease (age of onset: 67) in her mother. SOCIAL HISTORY:  reports that she quit smoking about 2 years ago. Her smoking use included Cigarettes. She has a 40 pack-year smoking history. She has never used smokeless  tobacco. She reports that she does not drink alcohol or use illicit drugs.  REVIEW OF SYSTEMS:   Constitutional: Negative for fever, chills, weight loss, malaise/fatigue and diaphoresis.  HENT: Negative for hearing loss, ear pain, nosebleeds, congestion, sore throat, neck pain, tinnitus and ear discharge.   Eyes: Negative for blurred vision, double vision, photophobia, pain, discharge and redness.  Respiratory: Negative for cough, hemoptysis, sputum production, shortness of breath, wheezing and stridor.   Cardiovascular: Negative for chest pain, palpitations, orthopnea, claudication, leg swelling and PND.  Gastrointestinal: Negative for heartburn, nausea, vomiting, abdominal pain, diarrhea, constipation, blood in stool and melena.  Genitourinary: Negative for dysuria, urgency, frequency, hematuria and flank pain.  Musculoskeletal: Negative for myalgias, back pain, joint pain and falls.  Skin: Negative for itching and rash.  Neurological: Negative for dizziness, tingling, tremors, sensory change, speech change, focal weakness, seizures, loss of consciousness, weakness and headaches.  Endo/Heme/Allergies: Negative for environmental allergies and polydipsia. Does not bruise/bleed easily.  SUBJECTIVE:   VITAL SIGNS: Temp:  [97.8 F (36.6 C)-98.3 F (36.8 C)] 98.3 F (36.8 C) (12/28 1420)  Pulse Rate:  [61-103] 103 (12/28 1420) Resp:  [19-20] 20 (12/28 1420) BP: (107-132)/(66-89) 132/89 mmHg (12/28 1420) SpO2:  [93 %-100 %] 100 % (12/28 1420) FiO2 (%):  [28 %] 28 % (12/27 2157)  PHYSICAL EXAMINATION: General:  Think frail, Neuro:  AXOx3. Speech normal. Moves all 4s.  Pscyh : anxious HEENT:  Supple NECk. No neck nodes Cardiovascular:  Normal heart sounds. No elevated JVP  Lungs:  Purse Lip breathing +. Mild acc muscle use + Abdomen:  Soft, no mass. Normal bowel sounds Musculoskeletal:  No cyanosis, no clubbing no edema Skin:  intact  PULMONARY No results for input(s): PHART, PCO2ART,  PO2ART, HCO3, TCO2, O2SAT in the last 168 hours.  Invalid input(s): PCO2, PO2  CBC  Recent Labs Lab 03/13/14 1142 03/14/14 0523 03/15/14 1040  HGB 14.4 13.0 12.9  HCT 43.0 39.8 40.1  WBC 17.1* 14.5* 11.3*  PLT 127* 127* 156    COAGULATION No results for input(s): INR in the last 168 hours.  CARDIAC   Recent Labs Lab 03/13/14 1142  TROPONINI <0.03   No results for input(s): PROBNP in the last 168 hours.   CHEMISTRY  Recent Labs Lab 03/13/14 1142 03/14/14 0523 03/15/14 1040  NA 135 139 140  K 3.9 4.1 3.7  CL 96 100 100  CO2 28 31 32  GLUCOSE 143* 114* 123*  BUN 16 25* 29*  CREATININE 1.10 1.28* 1.12*  CALCIUM 9.4 9.2 9.5   CrCl cannot be calculated (Unknown ideal weight.).   LIVER No results for input(s): AST, ALT, ALKPHOS, BILITOT, PROT, ALBUMIN, INR in the last 168 hours.   INFECTIOUS No results for input(s): LATICACIDVEN, PROCALCITON in the last 168 hours.   ENDOCRINE CBG (last 3)  No results for input(s): GLUCAP in the last 72 hours.       IMAGING x48h No results found.     ASSESSMENT / PLAN:  Baseline  - Hx of GOLD 4 COPD with emphysema. Current  - Acute on chronic hypoxic/hypercapnic respiratory failure.  -  Slow to resolve COPD exacerbation - could just be virulence (virus) or host factors and nothing can be done about this becaues current mgmt seems optimal. Need to rule out NSTEMI/CHF, ?? PE as possible associated etiologies impeding response   Plan: Day 3 of levaquin, started 12/26 Continue solumedrol but increase to 60 mg IV q6h Change dulera to pulmicort/dupneb  via nebulizer Oxygen to keep SpO2 88 to 94% Bronchial hygiene Check resp viurse panel Check ABG Check d-dimer   Lt lower lobe pulmonary nodule >> stable on CT chest from 03/05/14. Plan: Will need outpt follow up with Dr. Elsworth Soho for radiographic monitoring  Anxiety >> likely contributing to sense of dyspnea. Plan: Continue xanax She might also benefit  from low dose morphine to alleviate dyspnea  Goals of care >> she has GOPD 4 COPD with chronic hypoxic/hypercapnic respiratory failure.  She might benefit from palliative care evaluation to further discuss appropriate goals of care. She did express significant concern about her long term health      Dr. Brand Males, M.D., [facility].C.P Pulmonary and Critical Care Medicine Staff Physician [place] [facility] Pager: [phone], If no answer or between  15:00h - 7:00h: call [phone]  03/15/2014 6:58 PM

## 2014-03-15 NOTE — Progress Notes (Signed)
Utilization review completed. Ieisha Gao, RN, BSN. 

## 2014-03-16 DIAGNOSIS — F411 Generalized anxiety disorder: Secondary | ICD-10-CM

## 2014-03-16 DIAGNOSIS — R0602 Shortness of breath: Secondary | ICD-10-CM

## 2014-03-16 DIAGNOSIS — Z515 Encounter for palliative care: Secondary | ICD-10-CM

## 2014-03-16 LAB — BASIC METABOLIC PANEL
ANION GAP: 12 (ref 5–15)
BUN: 33 mg/dL — ABNORMAL HIGH (ref 6–23)
CALCIUM: 9.6 mg/dL (ref 8.4–10.5)
CO2: 27 mmol/L (ref 19–32)
CREATININE: 1 mg/dL (ref 0.50–1.10)
Chloride: 101 mEq/L (ref 96–112)
GFR calc Af Amer: 66 mL/min — ABNORMAL LOW (ref >90)
GFR, EST NON AFRICAN AMERICAN: 57 mL/min — AB (ref >90)
Glucose, Bld: 127 mg/dL — ABNORMAL HIGH (ref 70–99)
Potassium: 3.9 mmol/L (ref 3.5–5.1)
SODIUM: 140 mmol/L (ref 135–145)

## 2014-03-16 LAB — TROPONIN I

## 2014-03-16 MED ORDER — MORPHINE SULFATE (CONCENTRATE) 10 MG/0.5ML PO SOLN
5.0000 mg | ORAL | Status: DC | PRN
  Administered 2014-03-16: 5 mg via ORAL
  Filled 2014-03-16: qty 0.5

## 2014-03-16 MED ORDER — MORPHINE SULFATE (CONCENTRATE) 10 MG/0.5ML PO SOLN
5.0000 mg | ORAL | Status: DC | PRN
  Administered 2014-03-17: 10 mg via ORAL
  Filled 2014-03-16: qty 0.5

## 2014-03-16 NOTE — Progress Notes (Signed)
Name: Shawna [name] MRN: [id number] DOB: 05/01/1945    ADMISSION DATE:  03/13/2014 CONSULTATION DATE:  03/15/2014  REFERRING MD :  Bertha Stakes  CHIEF COMPLAINT:  Short of breath  BRIEF PATIENT DESCRIPTION:  68 yo female former smoker admitted with progressive dyspnea, non productive cough, and pleuritic chest pain felt to be from AECOPD.  Failed to improved, and PCCM consulted.  Followed by Dr. 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  SIGNIFICANT EVENTS   07/31/13 PFT >> FEV1 0.95 (37%), FEV1% 40, TLC 6.43 (120%), DLCO 38%, + BD response 03/05/14 CT chest >> moderate CAD, s/p RULectomy, severe emphysema, 1.6 cm nodule LLL no change 12/24 Started prednisone, augmentin as outpt ....................................... 12/26 To urgent care for dyspnea > low oxygen saturation > to ER and admitted 03/13/14 Influenza PCR >> negative   HISTORY OF PRESENT ILLNESS:   She is followed in pulmonary office by Dr. Elsworth Soho.  She developed worsening dyspnea and called office on 03/11/14.  She had started prednisone as an outpt, and had script for augmentin called in.  She was using her albuterol nebulizer every 4 hours with only partial improvement.  She presented to the ER on 03/13/14 with progressive dyspnea and non productive cough.  She was noted to have low oxygen level.  She was not having fever, chills, or headache.  She was noted to have pleuritic chest pain also.  Her chest xray was unrevealing on admission.  She was admitted by IMTS.  She was started on supplemental oxygen, solumedrol, levaquin, and scheduled bronchodilator therapy.  She was also started on scheduled xanax due to issues with anxiety contributing to dyspnea.    She continue to have dyspnea, and did not feel like therapy was improving her symptoms.  As a result pulmonary consultation was  requested.  In addition she also states - she is on o2 x 2 year. LAst 1 year progressive decline in effort tolerance - class 3 dyspneaa with further worsening since august 2015 and now with AECOPD - dyspnea is class 4. She is very worried about her long term prognosis  SUBJECTIVE: tearful after speaking to palliative care breathign remains bad afebrile  VITAL SIGNS: Temp:  [97.5 F (36.4 C)-98.3 F (36.8 C)] 97.5 F (36.4 C) (12/29 0441) Pulse Rate:  [74-103] 78 (12/29 0441) Resp:  [20] 20 (12/29 0441) BP: (128-134)/(72-89) 134/73 mmHg (12/29 0441) SpO2:  [95 %-100 %] 96 % (12/29 1059)  PHYSICAL EXAMINATION: General:  Think frail, Neuro:  AXOx3. Speech normal. Moves all 4s.  Pscyh : anxious HEENT:  Supple NECk. No neck nodes Cardiovascular:  Normal heart sounds. No elevated JVP  Lungs:  Purse Lip breathing +. Mild acc muscle use + Abdomen:  Soft, no mass. Normal bowel sounds Musculoskeletal:  No cyanosis, no clubbing no edema Skin:  intact  PULMONARY  Recent Labs Lab 03/15/14 1930  PHART 7.424  PCO2ART 44.8  PO2ART 72.0*  HCO3 28.8*  TCO2 30.1  O2SAT 93.9    CBC  Recent Labs Lab 03/13/14 1142 03/14/14 0523 03/15/14 1040  HGB 14.4 13.0 12.9  HCT 43.0 39.8 40.1  WBC 17.1* 14.5* 11.3*  PLT 127* 127* 156    COAGULATION No results for input(s): INR in the last 168 hours.  CARDIAC    Recent Labs Lab 03/13/14 1142 03/15/14 2215 03/16/14 0405 03/16/14 1058  TROPONINI <0.03 <0.03 <0.03 <0.03   No results  for input(s): PROBNP in the last 168 hours.   CHEMISTRY  Recent Labs Lab 03/13/14 1142 03/14/14 0523 03/15/14 1040 03/16/14 0405  NA 135 139 140 140  K 3.9 4.1 3.7 3.9  CL 96 100 100 101  CO2 28 31 32 27  GLUCOSE 143* 114* 123* 127*  BUN 16 25* 29* 33*  CREATININE 1.10 1.28* 1.12* 1.00  CALCIUM 9.4 9.2 9.5 9.6   CrCl cannot be calculated (Unknown ideal weight.).   LIVER No results for input(s): AST, ALT, ALKPHOS, BILITOT, PROT,  ALBUMIN, INR in the last 168 hours.   INFECTIOUS No results for input(s): LATICACIDVEN, PROCALCITON in the last 168 hours.   ENDOCRINE CBG (last 3)  No results for input(s): GLUCAP in the last 72 hours.       IMAGING x48h Dg Chest 2 View  03/15/2014   CLINICAL DATA:  Chest pain, shortness of breath.  EXAM: CHEST  2 VIEW  COMPARISON:  Chest radiograph 03/13/2014  FINDINGS: Stable cardiac and mediastinal contours. Postoperative changes involving the right hemi thorax. Minimal left basilar atelectasis. No definite pleural effusion or pneumothorax. Mid thoracic spine degenerative changes. Postoperative changes involving the right hemi thorax.  IMPRESSION: No acute cardiopulmonary process.  Left basilar atelectasis.   Electronically Signed   By: Lovey Newcomer M.D.   On: 03/15/2014 20:28       ASSESSMENT / PLAN:  Baseline  - Hx of GOLD D COPD with emphysema. Current  - Acute on chronic hypoxic/hypercapnic respiratory failure.  -  Slow to resolve COPD exacerbation - could just be virulence (virus) or host factors and nothing can be done about this becaues current mgmt seems optimal.  Neg D-dimer  Plan: ct levaquin, started 12/26 Continue solumedrol  60 mg IV q6h -decrease once bspasm resolves Change dulera to pulmicort/duoneb  via nebulizer Check resp viurse panel   Lt lower lobe pulmonary nodule >> stable on CT chest from 03/05/14. Plan: She has a history of benign nodules  Anxiety >> likely contributing to sense of dyspnea. Plan: Continue xanax Agree with low dose morphine to alleviate dyspnea  Goals of care >> she has GOPD D COPD with chronic hypoxic/hypercapnic respiratory failure.  Appreciate palliative care evaluation  - low dose morphine OK but I remain hopeful that we can help her to get to some level of functioning    Kara Mead MD. FCCP. [place] [facility] Pager [phone] If no response call [phone]   03/16/2014 12:20 PM

## 2014-03-16 NOTE — Progress Notes (Signed)
Subjective: Pulmonology was consulted yesterday for additional recommendations and feels she is nearing end-stage COPD. In addition to checking labs to rule out NSTEMI, viral illness, and PE, they recommended consulting Palliative Care for goals of care discussion.  This AM, she does not report feeling any better. She is unable to recollect any information that was documented in the consult note from yesterday. She is concerned that this is a new baseline for her and misses being able to do housework and yardwork like she was able to do about 1 year ago before things got worse.    Objective: Vital signs in last 24 hours: Filed Vitals:   03/15/14 2110 03/16/14 0026 03/16/14 0340 03/16/14 0441  BP: 128/72   134/73  Pulse: 74   78  Temp: 98.2 F (36.8 C)   97.5 F (36.4 C)  TempSrc:      Resp: 20   20  SpO2: 98% 96% 95% 97%   Weight change:   Intake/Output Summary (Last 24 hours) at 03/16/14 0713 Last data filed at 03/16/14 0630  Gross per 24 hour  Intake    480 ml  Output      0 ml  Net    480 ml   Physical Exam: Appearance: in NAD, wearing 2L [place], anxious-appearing HEENT: AT/[place], PERRL, EOMi, no lymphadenopathy, oropharynx clear of exudates Heart: tachycardic, normal rhythm, normal S1/S2 Lungs: expiratory wheezes R>L, stable from yesterday Abdomen: thin, BS+, soft, nontender  Musculoskeletal: normal range of motion Extremities: no edema BLE Neurologic: A&Ox3, grossly intact Skin: nontender red hematomas on arms b/l  Lab Results: Basic Metabolic Panel:  Recent Labs Lab 03/15/14 1040 03/16/14 0405  NA 140 140  K 3.7 3.9  CL 100 101  CO2 32 27  GLUCOSE 123* 127*  BUN 29* 33*  CREATININE 1.12* 1.00  CALCIUM 9.5 9.6   CBC:  Recent Labs Lab 03/13/14 1142 03/14/14 0523 03/15/14 1040  WBC 17.1* 14.5* 11.3*  NEUTROABS 16.1*  --   --   HGB 14.4 13.0 12.9  HCT 43.0 39.8 40.1  MCV 95.3 96.6 96.9  PLT 127* 127* 156   Cardiac Enzymes:  Recent Labs Lab  03/13/14 1142 03/15/14 2215 03/16/14 0405  TROPONINI <0.03 <0.03 <0.03   Studies/Results: Dg Chest 2 View  03/15/2014   CLINICAL DATA:  Chest pain, shortness of breath.  EXAM: CHEST  2 VIEW  COMPARISON:  Chest radiograph 03/13/2014  FINDINGS: Stable cardiac and mediastinal contours. Postoperative changes involving the right hemi thorax. Minimal left basilar atelectasis. No definite pleural effusion or pneumothorax. Mid thoracic spine degenerative changes. Postoperative changes involving the right hemi thorax.  IMPRESSION: No acute cardiopulmonary process.  Left basilar atelectasis.   Electronically Signed   By: Lovey Newcomer M.D.   On: 03/15/2014 20:28   Medications: I have reviewed the patient's current medications. Scheduled Meds: . ALPRAZolam  0.25 mg Oral BID  . budesonide  0.25 mg Nebulization BID  . enoxaparin (LOVENOX) injection  40 mg Subcutaneous Q24H  . ipratropium-albuterol  3 mL Nebulization Q4H  . [START ON 03/17/2014] levofloxacin  750 mg Oral Q48H  . methylPREDNISolone (SOLU-MEDROL) injection  60 mg Intravenous Q6H  . pantoprazole  40 mg Oral Daily  . verapamil  180 mg Oral Daily   Continuous Infusions:  PRN Meds:.acetaminophen, ipratropium-albuterol, magic mouthwash Assessment/Plan:  Ms. Walpole is a 68 year old woman with COPD (Gold Stage 3, 2L O2 at home), anxiety hospitalized for acute COPD exacerbation with persistent dyspnea.  COPD Exacerbation (  Gold Stage 3): Post-bronchodilator FEV1 37% of predicted per last PFTs done in May which puts her at Stage 3 though noted as Stage 4 by Pulmonology yesterday. Repeat CXR without new acute findings. Virus panel pending though D-dimer, troponin x 3 unremarkable. BNP 272, up from 85 just two days ago; EF 55-60% per last echo (2013).  - Continue levaquin 750 mg daily (Day 4) - Increased IV solumedrol 40mg ->60mg  q6h per recs - Duonebs q4h & q2h PRN - Transition Dulera->Pulmicort 2 puffs BID - Consider repeat echo - Consult  Palliative Care for further symptom management & goals of care  Anxiety: Continue Xanax 0.25mg  BID  AKI: Crt 1.0, down from 1.1 yesterday though BUN/Crt ratio > 20. She does not report feeling dry.  -Encourage PO fluid intake and reassess  Malignant Neoplasm of Left Lower Lobe: No significant change on most recent CT (12/18).  - Follow up chest CT recommended in 1 year  Hypertension: BP trending 120-130/60-70.  - Hold home PRN lasix - Continue verapamil 180 mg daily  Diet:  - Regular  DVT Ppx:  - Lovenox  Dispo: Disposition is deferred at this time, awaiting improvement of current medical problems.  Anticipated discharge in approximately 1 day(s).   The patient does have a current PCP [name], DO) and does not need an [facility] hospital follow-up appointment after discharge.  The patient does not have transportation limitations that hinder transportation to clinic appointments.  .Services Needed at time of discharge: Y = Yes, Blank = No PT:   OT:   RN:   Equipment:   Other:     LOS: 3 days   Charlott Rakes, MD 03/16/2014, 7:13 AM

## 2014-03-16 NOTE — Consult Note (Signed)
Patient LF:QBDHURT Shawna [name]      DOB: 09-29-1945      IKK:[id number]     Consult Note from the Palliative Medicine Team at [facility]    Consult Requested by: Dr. Allena Katz     PCP: Samuel Jester, DO Reason for Consultation: GOC with severe COPD   Phone Number:[phone]  Assessment of patients Current state: I met today with [name] at bedside and she is extremely tearful throughout my visit. I provided emotional support and allowed her time to express her grief and frustration in the difficult health position she has found herself. She explains that she is stressed about financial strain that was left by her husband and now more expense with her illness. She tells me that she brings in too much money for Medicaid. 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 Her life is greatly limited by her shortness of breath and quality is poor. We discussed a trial of roxanol to help improve her quality of life and she agrees. We also briefly discussed Living Will and code status in which she says that she has a Living Will and her daughter, Shawna [name], is her HCPOA. She alludes to DNR status but says "it will be in the will - Shawna [name] will know." I will follow up with Shawna [name].   I did speak with daughter, Shawna [name], over the phone and she tells me that her mother is very independent and that they try to help her the best they can. They have offered assistance with her finances as well as offered her to live with them. Seems she has good support with 3 daughters close by. Shawna [name] does tell me that her mother has expressed to her that she would not want to be on a breathing machine or have a tracheostomy. Shawna [name] also tells me that her mother has expressed  that she would not want to live this way if her breathing and quality of life does not improve "I wish the Shawna [name] would go ahead and take me." Shawna [name] is a Charity fundraiser at [employer] so she has good understanding of situation. I went back this evening to clarify code status with [name] but she was on the phone - she did seem happier and breathing more relaxed. I will follow up tomorrow. She did tell me that the roxanol helped a little so we will try a slightly increased dose and see how she tolerates this.   Goals of Care: 1.  Code Status: FULL - needs to be clarified.    2. Scope of Treatment: Continue available and offered medical interventions.    4. Disposition: Hopefully home when stable.    3. Symptom Management:   1. Dyspnea: Roxanol increased to 5-10 mg every 3 hours prn.  2. Anxiety/Agitation: Agree with Xanax 0.25 mg BID. May need prn dose as anxiety likely playing large role in her dyspnea as well.    4. Psychosocial: Emotional support provided to patient and to family over the phone.   5. Spiritual: She is very spiritual and talks of reading the bible and that she misses going to church. She says her pastor is very supportive. Educated her on use  of hospital chaplain as desired - does not desire this at this time.    Brief HPI: 68 yo female admitted with SOB worsening over 3 days r/t acute COPD exacerbation and she reports fairly persistent and limiting dyspnea since August 2015. She also has a stable LLL neoplasm with conservative monitoring. PMH significant for COPD, HTN, LLL lung mass, diverticulitis, osteoarthritis.    ROS: + shortness of breath/dyspnea    PMH:  Past Medical History  Diagnosis Date  . COPD (chronic obstructive pulmonary disease)   . HTN (hypertension)   . B12 deficiency   . OA (osteoarthritis)   . Hypokalemia   . Mass of lung   . GERD (gastroesophageal reflux disease)   . Diverticulosis of colon (without mention of hemorrhage)   . 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 Pulmonary nodule      PSH: Past Surgical History  Procedure Laterality Date  . Carpal tunnel release      bilateral  . Tubal ligation      x 2  . Cervical spine surgery      plate with screws  . Lung surgery Right 07/06/11    RUL, per patient Dr Roxan Hockey  . Video bronchoscopy Bilateral 01/20/2013    Procedure: VIDEO BRONCHOSCOPY WITH FLUORO;  Surgeon: Rigoberto Noel, MD;  Location: [facility];  Service: Cardiopulmonary;  Laterality: Bilateral;  . Cataract extraction w/phaco Left 11/30/2013    Procedure: CATARACT EXTRACTION PHACO AND INTRAOCULAR LENS PLACEMENT (IOC);  Surgeon: Tonny Branch, MD;  Location: [facility];  Service: Ophthalmology;  Laterality: Left;  CDE:  11.35   I have reviewed the [place] and SH and  If appropriate update it with new information. Allergies  Allergen Reactions  . Aspirin Swelling    Lips and face swelled.  [name] [Oxycodone-Aspirin] Other (See Comments)    "went out of my head"   Scheduled Meds: . ALPRAZolam  0.25 mg Oral BID  . budesonide  0.25 mg Nebulization BID  . enoxaparin (LOVENOX) injection  40 mg Subcutaneous Q24H  . ipratropium-albuterol  3 mL Nebulization Q4H  . [START ON 03/17/2014] levofloxacin  750 mg Oral Q48H  . methylPREDNISolone (SOLU-MEDROL) injection  60 mg Intravenous Q6H  . pantoprazole  40 mg Oral Daily  . verapamil  180 mg Oral Daily   Continuous Infusions:  PRN Meds:.acetaminophen, ipratropium-albuterol, magic mouthwash, morphine CONCENTRATE    BP 134/73 mmHg  Pulse 78  Temp(Src) 97.5 F (36.4 C) (Oral)  Resp 20  SpO2 96%   PPS: 30%   Intake/Output Summary (Last 24 hours) at 03/16/14 1152 Last data filed at 03/16/14 0630  Gross per 24 hour  Intake    240 ml  Output      0 ml  Net    240 ml   LBM: 03/16/14  Physical Exam:  General: Slightly distressed, anxious, thin HEENT:  [place]/AT, no JVD, moist mucous membranes Chest: Labored with conversation, sitting up in bed  attempting tri-pod for relief CVS: Tachycardic Abdomen: Soft, NT, ND Ext: MAE, no edema, warm to touch  Neuro: Awake, alert, oriented x 3  Labs: CBC    Component Value Date/Time   WBC 11.3* 03/15/2014 1040   WBC 7.5 09/21/2013 1333   RBC 4.14 03/15/2014 1040   RBC 4.62 09/21/2013 1333   HGB 12.9 03/15/2014 1040   HGB 13.9 09/21/2013 1333   HCT 40.1 03/15/2014 1040   HCT 41.6 09/21/2013 1333   PLT 156 03/15/2014 1040   PLT  119 Large platelets present* 09/21/2013 1333   MCV 96.9 03/15/2014 1040   MCV 90.0 09/21/2013 1333   MCH 31.2 03/15/2014 1040   MCH 30.1 09/21/2013 1333   MCHC 32.2 03/15/2014 1040   MCHC 33.4 09/21/2013 1333   RDW 13.3 03/15/2014 1040   RDW 14.4 09/21/2013 1333   LYMPHSABS 0.3* 03/13/2014 1142   LYMPHSABS 1.3 09/21/2013 1333   MONOABS 0.7 03/13/2014 1142   MONOABS 0.6 09/21/2013 1333   EOSABS 0.0 03/13/2014 1142   EOSABS 0.7* 09/21/2013 1333   BASOSABS 0.0 03/13/2014 1142   BASOSABS 0.0 09/21/2013 1333    BMET    Component Value Date/Time   NA 140 03/16/2014 0405   NA 140 09/21/2013 1333   K 3.9 03/16/2014 0405   K 3.4* 09/21/2013 1333   CL 101 03/16/2014 0405   CO2 27 03/16/2014 0405   CO2 30* 09/21/2013 1333   GLUCOSE 127* 03/16/2014 0405   GLUCOSE 90 09/21/2013 1333   BUN 33* 03/16/2014 0405   BUN 12.6 09/21/2013 1333   CREATININE 1.00 03/16/2014 0405   CREATININE 1.0 09/21/2013 1333   CALCIUM 9.6 03/16/2014 0405   CALCIUM 10.1 09/21/2013 1333   GFRNONAA 57* 03/16/2014 0405   GFRAA 66* 03/16/2014 0405    CMP     Component Value Date/Time   NA 140 03/16/2014 0405   NA 140 09/21/2013 1333   K 3.9 03/16/2014 0405   K 3.4* 09/21/2013 1333   CL 101 03/16/2014 0405   CO2 27 03/16/2014 0405   CO2 30* 09/21/2013 1333   GLUCOSE 127* 03/16/2014 0405   GLUCOSE 90 09/21/2013 1333   BUN 33* 03/16/2014 0405   BUN 12.6 09/21/2013 1333   CREATININE 1.00 03/16/2014 0405   CREATININE 1.0 09/21/2013 1333   CALCIUM 9.6 03/16/2014 0405    CALCIUM 10.1 09/21/2013 1333   PROT 7.0 09/21/2013 1333   PROT 7.5 01/26/2013 0710   ALBUMIN 3.6 09/21/2013 1333   ALBUMIN 3.5 01/26/2013 0710   AST 19 09/21/2013 1333   AST 17 01/26/2013 0710   ALT 13 09/21/2013 1333   ALT 14 01/26/2013 0710   ALKPHOS 72 09/21/2013 1333   ALKPHOS 86 01/26/2013 0710   BILITOT 0.36 09/21/2013 1333   BILITOT 0.3 01/26/2013 0710   GFRNONAA 57* 03/16/2014 0405   GFRAA 66* 03/16/2014 0405      Time In Time Out Total Time Spent with Patient Total Overall Time  1050 1210 11min 24min    Greater than 50%  of this time was spent counseling and coordinating care related to the above assessment and plan.  Vinie Sill, NP Palliative Medicine Team Pager # [phone] (M-F 8a-5p) Team Phone # [phone] (Nights/Weekends)

## 2014-03-16 NOTE — Progress Notes (Signed)
VASCULAR LAB PRELIMINARY  PRELIMINARY  PRELIMINARY  PRELIMINARY  Bilateral lower extremity venous duplex  completed.    Preliminary report:  Bilateral:  No evidence of DVT, superficial thrombosis, or Baker's Cyst.    Sherion Dooly, RVT 03/16/2014, 1:09 PM

## 2014-03-17 ENCOUNTER — Telehealth: Payer: Self-pay | Admitting: Pulmonary Disease

## 2014-03-17 LAB — RESPIRATORY VIRUS PANEL
Adenovirus: NOT DETECTED
Influenza A H1: NOT DETECTED
Influenza A H3: NOT DETECTED
Influenza A: NOT DETECTED
Influenza B: NOT DETECTED
METAPNEUMOVIRUS: NOT DETECTED
PARAINFLUENZA 2 A: NOT DETECTED
PARAINFLUENZA 3 A: NOT DETECTED
Parainfluenza 1: NOT DETECTED
Respiratory Syncytial Virus A: NOT DETECTED
Respiratory Syncytial Virus B: NOT DETECTED
Rhinovirus: NOT DETECTED

## 2014-03-17 MED ORDER — METHYLPREDNISOLONE SODIUM SUCC 125 MG IJ SOLR
60.0000 mg | Freq: Two times a day (BID) | INTRAMUSCULAR | Status: DC
  Administered 2014-03-17 – 2014-03-18 (×2): 60 mg via INTRAVENOUS
  Filled 2014-03-17 (×2): qty 0.96

## 2014-03-17 MED ORDER — MORPHINE SULFATE 15 MG PO TABS
7.5000 mg | ORAL_TABLET | ORAL | Status: DC | PRN
  Administered 2014-03-17: 7.5 mg via ORAL
  Filled 2014-03-17: qty 1

## 2014-03-17 MED ORDER — IPRATROPIUM-ALBUTEROL 0.5-2.5 (3) MG/3ML IN SOLN
3.0000 mL | Freq: Four times a day (QID) | RESPIRATORY_TRACT | Status: DC
  Administered 2014-03-17 – 2014-03-18 (×4): 3 mL via RESPIRATORY_TRACT
  Filled 2014-03-17 (×4): qty 3

## 2014-03-17 NOTE — Progress Notes (Signed)
Name: Shawna [name] MRN: [id number] DOB: Mar 21, 1945    ADMISSION DATE:  03/13/2014 CONSULTATION DATE:  03/15/2014  REFERRING MD :  Bertha Stakes  CHIEF COMPLAINT:  Short of breath  BRIEF PATIENT DESCRIPTION:  68 yo female former smoker admitted with progressive dyspnea, non productive cough, and pleuritic chest pain felt to be from AECOPD.  Failed to improved, and PCCM consulted.  Followed by Dr. 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  SIGNIFICANT EVENTS   07/31/13 PFT >> FEV1 0.95 (37%), FEV1% 40, TLC 6.43 (120%), DLCO 38%, + BD response 03/05/14 CT chest >> moderate CAD, s/p RULectomy, severe emphysema, 1.6 cm nodule LLL no change 12/24 Started prednisone, augmentin as outpt ....................................... 12/26 To urgent care for dyspnea > low oxygen saturation > to ER and admitted 03/13/14 Influenza PCR >> negative     SUBJECTIVE: In better spirits today Breathing improved  Afebrile 'can I go home for new years'?'  VITAL SIGNS: Temp:  [97.6 F (36.4 C)-98.3 F (36.8 C)] 97.6 F (36.4 C) (12/30 0600) Pulse Rate:  [85-88] 85 (12/30 0600) Resp:  [16-20] 16 (12/30 0600) BP: (142-156)/(79-87) 156/87 mmHg (12/30 0600) SpO2:  [95 %-99 %] 98 % (12/30 1120)  PHYSICAL EXAMINATION: General:  Think frail, Neuro:  AXOx3. Speech normal. Moves all 4s.  Pscyh : anxious HEENT:  Supple NECk. No neck nodes Cardiovascular:  Normal heart sounds. No elevated JVP  Lungs:  Purse Lip breathing +. Mild acc muscle use +faint exp rhonchi Abdomen:  Soft, no mass. Normal bowel sounds Musculoskeletal:  No cyanosis, no clubbing no edema Skin:  intact  PULMONARY  Recent Labs Lab 03/15/14 1930  PHART 7.424  PCO2ART 44.8  PO2ART 72.0*  HCO3 28.8*  TCO2 30.1  O2SAT 93.9    CBC  Recent Labs Lab 03/13/14 1142 03/14/14 0523  03/15/14 1040  HGB 14.4 13.0 12.9  HCT 43.0 39.8 40.1  WBC 17.1* 14.5* 11.3*  PLT 127* 127* 156    COAGULATION No results for input(s): INR in the last 168 hours.  CARDIAC    Recent Labs Lab 03/13/14 1142 03/15/14 2215 03/16/14 0405 03/16/14 1058  TROPONINI <0.03 <0.03 <0.03 <0.03   No results for input(s): PROBNP in the last 168 hours.   CHEMISTRY  Recent Labs Lab 03/13/14 1142 03/14/14 0523 03/15/14 1040 03/16/14 0405  NA 135 139 140 140  K 3.9 4.1 3.7 3.9  CL 96 100 100 101  CO2 28 31 32 27  GLUCOSE 143* 114* 123* 127*  BUN 16 25* 29* 33*  CREATININE 1.10 1.28* 1.12* 1.00  CALCIUM 9.4 9.2 9.5 9.6   CrCl cannot be calculated (Unknown ideal weight.).   LIVER No results for input(s): AST, ALT, ALKPHOS, BILITOT, PROT, ALBUMIN, INR in the last 168 hours.   INFECTIOUS No results for input(s): LATICACIDVEN, PROCALCITON in the last 168 hours.   ENDOCRINE CBG (last 3)  No results for input(s): GLUCAP in the last 72 hours.       IMAGING x48h Dg Chest 2 View  03/15/2014   CLINICAL DATA:  Chest pain, shortness of breath.  EXAM: CHEST  2 VIEW  COMPARISON:  Chest radiograph 03/13/2014  FINDINGS: Stable cardiac and mediastinal contours. Postoperative changes involving the right hemi thorax. Minimal left basilar atelectasis. No definite pleural effusion or pneumothorax. Mid thoracic spine degenerative changes. Postoperative changes involving the right hemi thorax.  IMPRESSION: No acute cardiopulmonary process.  Left basilar atelectasis.   Electronically Signed   By: Lovey Newcomer M.D.   On: 03/15/2014 20:28       ASSESSMENT / PLAN:  Baseline  - Hx of GOLD D COPD with emphysema. Current  - Acute on chronic hypoxic/hypercapnic respiratory failure.  -  Slow to resolve COPD exacerbation - could just be virulence (virus) or host factors and nothing can be done about this becaues current mgmt seems optimal.  Neg D-dimer  Plan: ct levaquin, started  12/26 Drop solumedrol  60 mg IV q12 h -PO in 24h if  bspasm resolves Change dulera to pulmicort/duoneb  via nebulizer   Lt lower lobe pulmonary nodule >> stable on CT chest from 03/05/14. Plan: She has a history of benign nodules  Anxiety >> Persistent dyspnea. Plan: Continue xanax Agree with low dose morphine to alleviate dyspnea  Goals of care >> she has GOPD D COPD with chronic hypoxic/hypercapnic respiratory failure.  Appreciate palliative care evaluation  - low dose morphine seems to have helped - she prefers pills to liquid  I remain hopeful that we can help her to get to previous level of functioning Hope for dc in [place] MD. Shade Flood. [facility] Pager [phone] If no response call [phone]   03/17/2014 12:28 PM

## 2014-03-17 NOTE — Progress Notes (Addendum)
Progress Note from the Palliative Medicine Team at [place]: Ms. Macht is much more awake with breathing much improved. She tells me she slept very well last night and feels much better today. She is asking about going home - I told her that was not up to me but it looks as if pulmonary wanted to continue IV steroids a bit longer. She tells me that she would like to try the morphine pills instead of the elixir as she does not like the taste - I am fine with whichever is her preference. We did discuss the importance of taking her anxiety medication and how common this is needed in COPD - she says she feels better about taking them knowing that she is not the only one who needs this (this was a concern of her daughter's that she was not taking her xanax at home). I discussed code status with her and she did confirm she would not want to be on a breathing machine or want CPR/shock - DNR ordered. I will continue to follow.     Objective: Allergies  Allergen Reactions  . Aspirin Swelling    Lips and face swelled.  [name] [Oxycodone-Aspirin] Other (See Comments)    "went out of my head"   Scheduled Meds: . ALPRAZolam  0.25 mg Oral BID  . budesonide  0.25 mg Nebulization BID  . enoxaparin (LOVENOX) injection  40 mg Subcutaneous Q24H  . ipratropium-albuterol  3 mL Nebulization Q4H  . levofloxacin  750 mg Oral Q48H  . methylPREDNISolone (SOLU-MEDROL) injection  60 mg Intravenous Q6H  . pantoprazole  40 mg Oral Daily  . verapamil  180 mg Oral Daily   Continuous Infusions:  PRN Meds:.acetaminophen, ipratropium-albuterol, magic mouthwash, morphine CONCENTRATE  BP 156/87 mmHg  Pulse 85  Temp(Src) 97.6 F (36.4 C) (Oral)  Resp 16  SpO2 95%   PPS: 30%   Intake/Output Summary (Last 24 hours) at 03/17/14 0933 Last data filed at 03/17/14 0636  Gross per 24 hour  Intake    960 ml  Output      0 ml  Net    960 ml      LBM: 03/16/14  Physical Exam:  General: NAD,  anxious, thin HEENT: [place]/AT, no JVD, moist mucous membranes Chest: No labored breathing CVS: RRR Abdomen: Soft, NT, ND Ext: MAE, no edema, warm to touch  Neuro: Awake, alert, oriented x 3  Labs: CBC    Component Value Date/Time   WBC 11.3* 03/15/2014 1040   WBC 7.5 09/21/2013 1333   RBC 4.14 03/15/2014 1040   RBC 4.62 09/21/2013 1333   HGB 12.9 03/15/2014 1040   HGB 13.9 09/21/2013 1333   HCT 40.1 03/15/2014 1040   HCT 41.6 09/21/2013 1333   PLT 156 03/15/2014 1040   PLT 119 Large platelets present* 09/21/2013 1333   MCV 96.9 03/15/2014 1040   MCV 90.0 09/21/2013 1333   MCH 31.2 03/15/2014 1040   MCH 30.1 09/21/2013 1333   MCHC 32.2 03/15/2014 1040   MCHC 33.4 09/21/2013 1333   RDW 13.3 03/15/2014 1040   RDW 14.4 09/21/2013 1333   LYMPHSABS 0.3* 03/13/2014 1142   LYMPHSABS 1.3 09/21/2013 1333   MONOABS 0.7 03/13/2014 1142   MONOABS 0.6 09/21/2013 1333   EOSABS 0.0 03/13/2014 1142   EOSABS 0.7* 09/21/2013 1333   BASOSABS 0.0 03/13/2014 1142   BASOSABS 0.0 09/21/2013 1333    BMET    Component Value Date/Time   NA 140 03/16/2014 0405  NA 140 09/21/2013 1333   K 3.9 03/16/2014 0405   K 3.4* 09/21/2013 1333   CL 101 03/16/2014 0405   CO2 27 03/16/2014 0405   CO2 30* 09/21/2013 1333   GLUCOSE 127* 03/16/2014 0405   GLUCOSE 90 09/21/2013 1333   BUN 33* 03/16/2014 0405   BUN 12.6 09/21/2013 1333   CREATININE 1.00 03/16/2014 0405   CREATININE 1.0 09/21/2013 1333   CALCIUM 9.6 03/16/2014 0405   CALCIUM 10.1 09/21/2013 1333   GFRNONAA 57* 03/16/2014 0405   GFRAA 66* 03/16/2014 0405    CMP     Component Value Date/Time   NA 140 03/16/2014 0405   NA 140 09/21/2013 1333   K 3.9 03/16/2014 0405   K 3.4* 09/21/2013 1333   CL 101 03/16/2014 0405   CO2 27 03/16/2014 0405   CO2 30* 09/21/2013 1333   GLUCOSE 127* 03/16/2014 0405   GLUCOSE 90 09/21/2013 1333   BUN 33* 03/16/2014 0405   BUN 12.6 09/21/2013 1333   CREATININE 1.00 03/16/2014 0405   CREATININE  1.0 09/21/2013 1333   CALCIUM 9.6 03/16/2014 0405   CALCIUM 10.1 09/21/2013 1333   PROT 7.0 09/21/2013 1333   PROT 7.5 01/26/2013 0710   ALBUMIN 3.6 09/21/2013 1333   ALBUMIN 3.5 01/26/2013 0710   AST 19 09/21/2013 1333   AST 17 01/26/2013 0710   ALT 13 09/21/2013 1333   ALT 14 01/26/2013 0710   ALKPHOS 72 09/21/2013 1333   ALKPHOS 86 01/26/2013 0710   BILITOT 0.36 09/21/2013 1333   BILITOT 0.3 01/26/2013 0710   GFRNONAA 57* 03/16/2014 0405   GFRAA 66* 03/16/2014 0405     Assessment and Plan: 1. Code Status: DNR 2. Symptom Control: 1. Dyspnea: MSIR 7.5 mg every 4 hours prn.  2. Anxiety/Agitation: Agree with Xanax 0.25 mg BID. May need prn dose as anxiety likely playing large role in her dyspnea as well.  3. Psycho/Social: Emotional support provided to patient.  4. Disposition: Home when able.    Time In Time Out Total Time Spent with Patient Total Overall Time  0930 0950 53min 54min    Greater than 50%  of this time was spent counseling and coordinating care related to the above assessment and plan.  Vinie Sill, NP Palliative Medicine Team Pager # [phone] (M-F 8a-5p) Team Phone # [phone] (Nights/Weekends)

## 2014-03-17 NOTE — Progress Notes (Signed)
Subjective: Yesterday, she was seen by Palliative Care and was started on low-dose morphine for additional dyspnea relief.   This AM, she appears more comfortable and feels the morphine is helping though does not like the taste. She worries that needing morphine signals that she is at the end, and I clarified that while it's used to ease pain in terminal illness, it can also be used earlier for symptom relief.   Objective: Vital signs in last 24 hours: Filed Vitals:   03/16/14 1400 03/16/14 2050 03/17/14 0500 03/17/14 0600  BP: 142/79 155/84  156/87  Pulse: 87 88  85  Temp: 98.1 F (36.7 C) 98.3 F (36.8 C)  97.6 F (36.4 C)  TempSrc:  Oral  Oral  Resp: 20 18  16   SpO2: 99% 97% 98% 95%   Weight change:   Intake/Output Summary (Last 24 hours) at 03/17/14 0904 Last data filed at 03/17/14 0636  Gross per 24 hour  Intake    960 ml  Output      0 ml  Net    960 ml   Physical Exam:  Appearance: in NAD, wearing 2L [place], anxious-appearing though less so today HEENT: AT/[place], PERRL, EOMi, no lymphadenopathy, oropharynx clear of exudates Heart: tachycardic, normal rhythm, normal S1/S2 Lungs: expiratory wheezes R>L, improved from yesterday Abdomen: thin, BS+, soft, nontender  Musculoskeletal: normal range of motion Extremities: no edema BLE Neurologic: A&Ox3, grossly intact Skin: nontender red hematomas on arms b/l  Lab Results: Basic Metabolic Panel:  Recent Labs Lab 03/15/14 1040 03/16/14 0405  NA 140 140  K 3.7 3.9  CL 100 101  CO2 32 27  GLUCOSE 123* 127*  BUN 29* 33*  CREATININE 1.12* 1.00  CALCIUM 9.5 9.6   CBC:  Recent Labs Lab 03/13/14 1142 03/14/14 0523 03/15/14 1040  WBC 17.1* 14.5* 11.3*  NEUTROABS 16.1*  --   --   HGB 14.4 13.0 12.9  HCT 43.0 39.8 40.1  MCV 95.3 96.6 96.9  PLT 127* 127* 156   Cardiac Enzymes:  Recent Labs Lab 03/15/14 2215 03/16/14 0405 03/16/14 1058  TROPONINI <0.03 <0.03 <0.03   Studies/Results: Dg Chest 2  View  03/15/2014   CLINICAL DATA:  Chest pain, shortness of breath.  EXAM: CHEST  2 VIEW  COMPARISON:  Chest radiograph 03/13/2014  FINDINGS: Stable cardiac and mediastinal contours. Postoperative changes involving the right hemi thorax. Minimal left basilar atelectasis. No definite pleural effusion or pneumothorax. Mid thoracic spine degenerative changes. Postoperative changes involving the right hemi thorax.  IMPRESSION: No acute cardiopulmonary process.  Left basilar atelectasis.   Electronically Signed   By: Lovey Newcomer M.D.   On: 03/15/2014 20:28   Medications: I have reviewed the patient's current medications. Scheduled Meds: . ALPRAZolam  0.25 mg Oral BID  . budesonide  0.25 mg Nebulization BID  . enoxaparin (LOVENOX) injection  40 mg Subcutaneous Q24H  . ipratropium-albuterol  3 mL Nebulization Q4H  . levofloxacin  750 mg Oral Q48H  . methylPREDNISolone (SOLU-MEDROL) injection  60 mg Intravenous Q6H  . pantoprazole  40 mg Oral Daily  . verapamil  180 mg Oral Daily   Continuous Infusions:  PRN Meds:.acetaminophen, ipratropium-albuterol, magic mouthwash, morphine CONCENTRATE Assessment/Plan:  Ms. Jessee is a 68 year old woman with COPD (Gold Group D, 2L O2 at home), anxiety hospitalized for acute COPD exacerbation with persistent dyspnea.  COPD Exacerbation (Gold Group D): Post-bronchodilator FEV1 37% of predicted per last PFTs done in May which puts her at Stage 3  though noted as Stage 4 by Pulmonology on this admission. Virus panel pending though D-dimer, troponin x 3 unremarkable. BNP 272, up from 85 just two days ago; EF 55-60% per last echo (2013).  - Continue levaquin 750 mg daily (Day 5) - Continue IV solumedrol 60mg  q6h per recs - Duonebs q4h & q2h PRN - Continue Pulmicort 2 puffs BID - Continue Roxanol 5-10mg  q3h prn for dyspnea - Pulmonology & Palliative Care following, appreciate recs  Anxiety: Continue Xanax 0.25mg  BID  AKI: Crt 1.0, down from 1.1 yesterday though  BUN/Crt ratio > 20. She does not report feeling dry.  -Encourage PO fluid intake and reassess  Malignant Neoplasm of Left Lower Lobe: No significant change on most recent CT (12/18).  - Follow up chest CT recommended in 1 year  Hypertension: BP trending 140-160/80s.  - Hold home PRN lasix - Continue verapamil 180 mg daily  Diet:  - Regular  DVT Ppx:  - Lovenox  Dispo: Disposition is deferred at this time, awaiting improvement of current medical problems.  Anticipated discharge in approximately 1 day(s).   The patient does have a current PCP [name], DO) and does not need an [facility] hospital follow-up appointment after discharge.  The patient does not have transportation limitations that hinder transportation to clinic appointments.  .Services Needed at time of discharge: Y = Yes, Blank = No PT:   OT:   RN:   Equipment:   Other:     LOS: 4 days   Charlott Rakes, MD 03/17/2014, 9:04 AM

## 2014-03-17 NOTE — Telephone Encounter (Signed)
lmtcb for pt.  

## 2014-03-18 MED ORDER — PREDNISONE 10 MG PO TABS: ORAL_TABLET | ORAL | Status: DC

## 2014-03-18 MED ORDER — PREDNISONE 20 MG PO TABS
40.0000 mg | ORAL_TABLET | Freq: Every day | ORAL | Status: DC
  Filled 2014-03-18: qty 2

## 2014-03-18 MED ORDER — MORPHINE SULFATE 10 MG/5ML PO SOLN: 5.0000 mg | ORAL | Status: DC | PRN

## 2014-03-18 MED ORDER — GUAIFENESIN ER 600 MG PO TB12: 600.0000 mg | ORAL_TABLET | Freq: Two times a day (BID) | ORAL | Status: DC | PRN

## 2014-03-18 MED ORDER — MORPHINE SULFATE (CONCENTRATE) 10 MG/0.5ML PO SOLN: 5.0000 mg | ORAL | Status: DC | PRN

## 2014-03-18 MED ORDER — SENNOSIDES-DOCUSATE SODIUM 8.6-50 MG PO TABS: 1.0000 | ORAL_TABLET | Freq: Every evening | ORAL | Status: DC | PRN

## 2014-03-18 NOTE — Progress Notes (Signed)
Internal Medicine Attending  Date: 03/18/2014  Patient name: Shawna [name] Medical record number: [id number] Date of birth: 07-11-1945 Age: 68 y.o. Gender: female  I saw and evaluated the patient and discussed her care with resident Dr. Posey Pronto.  Her shortness of breath has improved, and she feels that the oral morphine has helped.  She has no wheezing on lung exam.  I agree with plan to discharge home today, with outpatient follow-up with pulmonology in the near future.

## 2014-03-18 NOTE — Progress Notes (Signed)
Name: SAN RUA MRN: [id number] DOB: [birth year]    ADMISSION DATE:  03/13/2014 CONSULTATION DATE:  03/15/2014  REFERRING MD :  Bertha Stakes  CHIEF COMPLAINT:  Short of breath  BRIEF PATIENT DESCRIPTION:  68 yo female former smoker admitted with progressive dyspnea, non productive cough, and pleuritic chest pain felt to be from AECOPD.  Failed to improved, and PCCM consulted.  Followed by Dr. 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  SIGNIFICANT EVENTS   07/31/13 PFT >> FEV1 0.95 (37%), FEV1% 40, TLC 6.43 (120%), DLCO 38%, + BD response 03/05/14 CT chest >> moderate CAD, s/p RULectomy, severe emphysema, 1.6 cm nodule LLL no change 12/24 Started prednisone, augmentin as outpt ....................................... 12/26 To urgent care for dyspnea > low oxygen saturation > to ER and admitted 03/13/14 Influenza PCR >> negative     SUBJECTIVE: In better spirits today Breathing continues to improve  Afebrile Able to ambulate  VITAL SIGNS: Temp:  [98 F (36.7 C)-98.1 F (36.7 C)] 98 F (36.7 C) (12/31 0547) Pulse Rate:  [77-82] 78 (12/31 0547) Resp:  [18] 18 (12/31 0547) BP: (152-154)/(82-95) 154/82 mmHg (12/31 0547) SpO2:  [97 %-98 %] 98 % (12/31 0547)  PHYSICAL EXAMINATION: General:  Think frail, Neuro:  AXOx3. Speech normal. Moves all 4s.  Pscyh : anxious HEENT:  Supple NECk. No neck nodes Cardiovascular:  Normal heart sounds. No elevated JVP  Lungs:  Purse Lip breathing +. No acc muscle use +faint exp rhonchi Abdomen:  Soft, no mass. Normal bowel sounds Musculoskeletal:  No cyanosis, no clubbing no edema Skin:  intact  PULMONARY  Recent Labs Lab 03/15/14 1930  PHART 7.424  PCO2ART 44.8  PO2ART 72.0*  HCO3 28.8*  TCO2 30.1  O2SAT 93.9    CBC  Recent Labs Lab 03/13/14 1142 03/14/14 0523  03/15/14 1040  HGB 14.4 13.0 12.9  HCT 43.0 39.8 40.1  WBC 17.1* 14.5* 11.3*  PLT 127* 127* 156    COAGULATION No results for input(s): INR in the last 168 hours.  CARDIAC    Recent Labs Lab 03/13/14 1142 03/15/14 2215 03/16/14 0405 03/16/14 1058  TROPONINI <0.03 <0.03 <0.03 <0.03   No results for input(s): PROBNP in the last 168 hours.   CHEMISTRY  Recent Labs Lab 03/13/14 1142 03/14/14 0523 03/15/14 1040 03/16/14 0405  NA 135 139 140 140  K 3.9 4.1 3.7 3.9  CL 96 100 100 101  CO2 28 31 32 27  GLUCOSE 143* 114* 123* 127*  BUN 16 25* 29* 33*  CREATININE 1.10 1.28* 1.12* 1.00  CALCIUM 9.4 9.2 9.5 9.6   CrCl cannot be calculated (Unknown ideal weight.).   LIVER No results for input(s): AST, ALT, ALKPHOS, BILITOT, PROT, ALBUMIN, INR in the last 168 hours.   INFECTIOUS No results for input(s): LATICACIDVEN, PROCALCITON in the last 168 hours.   ENDOCRINE CBG (last 3)  No results for input(s): GLUCAP in the last 72 hours.       IMAGING x48h No results found.     ASSESSMENT / PLAN:  Baseline  - Hx of GOLD D COPD with emphysema. Current  - Acute on chronic hypoxic/hypercapnic respiratory failure.  -  Slow to resolve COPD exacerbation - could just be virulence (virus) or host factors and nothing can be done about this becaues current mgmt seems optimal.  Neg D-dimer  Plan: Dc levaquin after  today, started 12/26 Pred PO40 mg -slow taper over 2 weeks down to 10 mg & stay at this dose Resume dulera    Lt lower lobe pulmonary nodule >> stable on CT chest from 03/05/14. Plan: She has a history of benign nodules  Anxiety >> Persistent dyspnea. Plan: Continue xanax Agree with low dose roxanol  to alleviate dyspnea - discussed driving limitation x 2 weeks until we are cler on how this affects her  Goals of care >> she has GOPD D COPD with chronic hypoxic/hypercapnic respiratory failure.  Appreciate palliative care evaluation  - low dose  morphine seems to have helped with dyspnea  I remain hopeful that we can help her to get to previous level of functioning   OK to dc today FU appt made    Kara Mead MD. FCCP. [facility] Pager [phone] If no response call [phone]   03/18/2014 11:52 AM

## 2014-03-18 NOTE — Discharge Instructions (Signed)
Thank you for trusting [place] with your medical care!  You were hospitalized for acute COPD exacerbation and treated with medication.    Please take note of the following changes to your medications: -START Roxinol 5-10mg  every FOUR hours as needed for shortness of breath -START prednisone 40mg  (4 tablets) x FIVE days; then 20mg  (2 tablets) x FIVE days; then 10mg  (1 tablet) EVERY day thereafter  For the next 1-2 weeks, please avoid driving until your body adjusts to this medicine.

## 2014-03-18 NOTE — Progress Notes (Signed)
Subjective: This AM, she reports feeling better and would like to go home. She feels the Roxanol has helped her despite the taste.   Objective: Vital signs in last 24 hours: Filed Vitals:   03/17/14 2033 03/17/14 2046 03/18/14 0547 03/18/14 1220  BP:  152/88 154/82   Pulse:  82 78   Temp:  98.1 F (36.7 C) 98 F (36.7 C)   TempSrc:      Resp:  18 18   Height:    5\' 6"  (1.676 m)  Weight:    120 lb (54.432 kg)  SpO2: 97% 98% 98%    Weight change:   Intake/Output Summary (Last 24 hours) at 03/18/14 1305 Last data filed at 03/18/14 1610  Gross per 24 hour  Intake    710 ml  Output      0 ml  Net    710 ml   Physical Exam:  Appearance: in NAD, wearing 2L Shawna [name], appears relaxed HEENT: AT/[place], PERRL, EOMi, no lymphadenopathy, oropharynx clear of exudates Heart: tachycardic, normal rhythm, normal S1/S2 Lungs: expiratory wheezes R>L, stable from yesterday Abdomen: thin, BS+, soft, nontender  Musculoskeletal: normal range of motion Extremities: no edema BLE Neurologic: A&Ox3, grossly intact Skin: nontender red hematomas on arms b/l  Lab Results: Basic Metabolic Panel:  Recent Labs Lab 03/15/14 1040 03/16/14 0405  NA 140 140  K 3.7 3.9  CL 100 101  CO2 32 27  GLUCOSE 123* 127*  BUN 29* 33*  CREATININE 1.12* 1.00  CALCIUM 9.5 9.6   CBC:  Recent Labs Lab 03/13/14 1142 03/14/14 0523 03/15/14 1040  WBC 17.1* 14.5* 11.3*  NEUTROABS 16.1*  --   --   HGB 14.4 13.0 12.9  HCT 43.0 39.8 40.1  MCV 95.3 96.6 96.9  PLT 127* 127* 156   Cardiac Enzymes:  Recent Labs Lab 03/15/14 2215 03/16/14 0405 03/16/14 1058  TROPONINI <0.03 <0.03 <0.03   Studies/Results: No results found. Medications: I have reviewed the patient's current medications. Scheduled Meds: . ALPRAZolam  0.25 mg Oral BID  . budesonide  0.25 mg Nebulization BID  . enoxaparin (LOVENOX) injection  40 mg Subcutaneous Q24H  . ipratropium-albuterol  3 mL Nebulization QID  . levofloxacin  750 mg Oral  Q48H  . pantoprazole  40 mg Oral Daily  . [START ON 03/19/2014] predniSONE  40 mg Oral Q breakfast  . verapamil  180 mg Oral Daily   Continuous Infusions:  PRN Meds:.acetaminophen, ipratropium-albuterol, magic mouthwash, morphine CONCENTRATE Assessment/Plan:  Shawna [name] is a 68 year old woman with COPD (Gold Group D, 2L O2 at home), anxiety hospitalized for acute COPD exacerbation with persistent dyspnea stable for discharge today.  COPD Exacerbation (Gold Group D): Breathing improved from admission. Discharge her on prednisone taper (40mg  x 5 days, 20mg  x 5 days, 10mg  thereafter) along with the remainder of her home COPD medications. Viral panel negative. Scheduled follow-up appointments with PCP & Pulmonology. - Continue Roxanol 5-10mg  q4h prn for dyspnea - Also prescribed Sennakot-S so she has it on her file though she prefers using Miralax for constipation which she knows is a common side effect of opioids - Pulmonology & Palliative Care following, appreciate recs  Anxiety: Continue Xanax 0.25mg  BID  AKI: Crt 1.0 two days prior which appears to be her baseline. She is mentating well.    Malignant Neoplasm of Left Lower Lobe: No significant change on most recent CT (12/18).  - Follow up chest CT recommended in 1 year  Hypertension: Resume home Lasix &  verapamil.  Diet:  - Regular  DVT Ppx:  - Lovenox  Dispo: Disposition is deferred at this time, awaiting improvement of current medical problems.  Anticipated discharge today.   The patient does have a current PCP Shawna Graves, DO) and does not need an [facility] hospital follow-up appointment after discharge.  The patient does not have transportation limitations that hinder transportation to clinic appointments.  .Services Needed at time of discharge: Y = Yes, Blank = No PT:   OT:   RN:   Equipment:   Other:     LOS: 5 days   Charlott Rakes, MD 03/18/2014, 1:05 PM

## 2014-03-18 NOTE — Telephone Encounter (Signed)
lmomtcb for pt 

## 2014-03-18 NOTE — Progress Notes (Signed)
Progress Note from the Palliative Medicine Team at [facility]: Shawna [name] continues to look well this morning and continues to say she wants to go home today. She tells me she did not sleep as well last night but she was cold. Her breathing looks good this morning but she says not quite as good as this morning. She also tells me that she thinks the morphine elixir worked Diplomatic Services operational officer for her and she prefers this - even though it does taste bitter she thinks that is worth taking - I will change her back to roxanol. She has no other concerns but is looking forward to getting back home to her dogs. Her daughters have been helping clean her house to help with dust particles and she plans to have the carpets shampooed when she gets home.    Objective: Allergies  Allergen Reactions  . Aspirin Swelling    Lips and face swelled.  Shawna [name] [Oxycodone-Aspirin] Other (See Comments)    "went out of my head"   Scheduled Meds: . ALPRAZolam  0.25 mg Oral BID  . budesonide  0.25 mg Nebulization BID  . enoxaparin (LOVENOX) injection  40 mg Subcutaneous Q24H  . ipratropium-albuterol  3 mL Nebulization QID  . levofloxacin  750 mg Oral Q48H  . methylPREDNISolone (SOLU-MEDROL) injection  60 mg Intravenous Q12H  . pantoprazole  40 mg Oral Daily  . verapamil  180 mg Oral Daily   Continuous Infusions:  PRN Meds:.acetaminophen, ipratropium-albuterol, magic mouthwash, morphine  BP 154/82 mmHg  Pulse 78  Temp(Src) 98 F (36.7 C) (Oral)  Resp 18  SpO2 98%   PPS: 30%   Intake/Output Summary (Last 24 hours) at 03/18/14 0929 Last data filed at 03/18/14 2671  Gross per 24 hour  Intake    710 ml  Output      0 ml  Net    710 ml      LBM: 03/16/14  Physical Exam:  General: NAD, anxious, thin HEENT: [place]/AT, no JVD, moist mucous membranes Chest: No labored breathing CVS: RRR Abdomen: Soft, NT, ND Ext: MAE, no edema, warm to touch  Neuro: Awake, alert, oriented x 3   Labs: CBC    Component Value Date/Time   WBC 11.3* 03/15/2014 1040   WBC 7.5 09/21/2013 1333   RBC 4.14 03/15/2014 1040   RBC 4.62 09/21/2013 1333   HGB 12.9 03/15/2014 1040   HGB 13.9 09/21/2013 1333   HCT 40.1 03/15/2014 1040   HCT 41.6 09/21/2013 1333   PLT 156 03/15/2014 1040   PLT 119 Large platelets present* 09/21/2013 1333   MCV 96.9 03/15/2014 1040   MCV 90.0 09/21/2013 1333   MCH 31.2 03/15/2014 1040   MCH 30.1 09/21/2013 1333   MCHC 32.2 03/15/2014 1040   MCHC 33.4 09/21/2013 1333   RDW 13.3 03/15/2014 1040   RDW 14.4 09/21/2013 1333   LYMPHSABS 0.3* 03/13/2014 1142   LYMPHSABS 1.3 09/21/2013 1333   MONOABS 0.7 03/13/2014 1142   MONOABS 0.6 09/21/2013 1333   EOSABS 0.0 03/13/2014 1142   EOSABS 0.7* 09/21/2013 1333   BASOSABS 0.0 03/13/2014 1142   BASOSABS 0.0 09/21/2013 1333    BMET    Component Value Date/Time   NA 140 03/16/2014 0405   NA 140 09/21/2013 1333   K 3.9 03/16/2014 0405   K 3.4* 09/21/2013 1333   CL 101 03/16/2014 0405   CO2 27 03/16/2014 0405   CO2 30* 09/21/2013 1333   GLUCOSE 127* 03/16/2014 0405  GLUCOSE 90 09/21/2013 1333   BUN 33* 03/16/2014 0405   BUN 12.6 09/21/2013 1333   CREATININE 1.00 03/16/2014 0405   CREATININE 1.0 09/21/2013 1333   CALCIUM 9.6 03/16/2014 0405   CALCIUM 10.1 09/21/2013 1333   GFRNONAA 57* 03/16/2014 0405   GFRAA 66* 03/16/2014 0405    CMP     Component Value Date/Time   NA 140 03/16/2014 0405   NA 140 09/21/2013 1333   K 3.9 03/16/2014 0405   K 3.4* 09/21/2013 1333   CL 101 03/16/2014 0405   CO2 27 03/16/2014 0405   CO2 30* 09/21/2013 1333   GLUCOSE 127* 03/16/2014 0405   GLUCOSE 90 09/21/2013 1333   BUN 33* 03/16/2014 0405   BUN 12.6 09/21/2013 1333   CREATININE 1.00 03/16/2014 0405   CREATININE 1.0 09/21/2013 1333   CALCIUM 9.6 03/16/2014 0405   CALCIUM 10.1 09/21/2013 1333   PROT 7.0 09/21/2013 1333   PROT 7.5 01/26/2013 0710   ALBUMIN 3.6 09/21/2013 1333   ALBUMIN 3.5 01/26/2013 0710   AST 19  09/21/2013 1333   AST 17 01/26/2013 0710   ALT 13 09/21/2013 1333   ALT 14 01/26/2013 0710   ALKPHOS 72 09/21/2013 1333   ALKPHOS 86 01/26/2013 0710   BILITOT 0.36 09/21/2013 1333   BILITOT 0.3 01/26/2013 0710   GFRNONAA 57* 03/16/2014 0405   GFRAA 66* 03/16/2014 0405    Assessment and Plan: 1. Code Status: DNR 2. Symptom Control: 1. Dyspnea: Roxanol 5-10 mg every 4 hours prn.  2. Anxiety/Agitation: Agree with Xanax 0.25 mg BID. May need prn dose as anxiety likely playing large role in her dyspnea as well.  3. Psycho/Social: Emotional support provided to patient.  4. Disposition: Home when able.    Time In Time Out Total Time Spent with Patient Total Overall Time  0910 0930 80min 55min    Greater than 50%  of this time was spent counseling and coordinating care related to the above assessment and plan.  Vinie Sill, NP Palliative Medicine Team Pager # [phone] (M-F 8a-5p) Team Phone # [phone] (Nights/Weekends)

## 2014-03-18 NOTE — Progress Notes (Signed)
At the time of discharge, I prescribed this patient 500 mL of morphine 10mg /54mL solution but with the wrong indication (severe pain instead of shortness of breath). I recollected the inaccurate prescription, voided it, and discarded it in the shredder.

## 2014-03-22 NOTE — Telephone Encounter (Signed)
Dr. Elsworth Soho, a nurse called last week stating pt needed a HFU Monday or Wednesday "of next week."   I spoke with pt.  She has a pending HFU with TP on Jan 21. 2016.  Does this appt need to be moved up?  Please advise.  Thank you.

## 2014-03-22 NOTE — Telephone Encounter (Signed)
Prefer next week

## 2014-03-23 NOTE — Telephone Encounter (Signed)
Pt has been scheduled for 03/30/14 at 3:15pm with TP.

## 2014-03-30 ENCOUNTER — Ambulatory Visit (INDEPENDENT_AMBULATORY_CARE_PROVIDER_SITE_OTHER): Payer: Medicare Other | Admitting: Adult Health

## 2014-03-30 ENCOUNTER — Encounter: Payer: Self-pay | Admitting: Adult Health

## 2014-03-30 VITALS — BP 118/68 | HR 62 | Ht 66.0 in | Wt 117.2 lb

## 2014-03-30 DIAGNOSIS — J441 Chronic obstructive pulmonary disease with (acute) exacerbation: Secondary | ICD-10-CM

## 2014-03-30 DIAGNOSIS — J9610 Chronic respiratory failure, unspecified whether with hypoxia or hypercapnia: Secondary | ICD-10-CM

## 2014-03-30 NOTE — Assessment & Plan Note (Signed)
  Seen by Dr. Valere Dross Radiation Oncology , w/ repeat +PET along the Left upper mediastinus superior to the left hilum . Seen by Dr. Inda Merlin 09/2013 w/ plans to follow nodule serially as we do not have tissue dx to proceede with chemo. Case was discussed at thoracic oncology conference  Repeat CT in Dec showed stable nodule  Repeat CT in 6-9 months  Pt aware

## 2014-03-30 NOTE — Patient Instructions (Signed)
Take Prednisone 10mg  daily for 5 days then 1/2 tab (5mg  ) daily until gone.  Continue on current regimen  Follow up Dr. Elsworth Soho  In 2-3 months and As needed

## 2014-03-30 NOTE — Progress Notes (Signed)
Reviewed & agree with plan  

## 2014-03-30 NOTE — Assessment & Plan Note (Signed)
Compensated on o2  Plan  Cont on O2

## 2014-03-30 NOTE — Assessment & Plan Note (Signed)
Recent severe COPD exacerbation, now resolving Patient shortness of breath has improved with morphine We'll try to taper off steroids  Plan  Take Prednisone 10mg  daily for 5 days then 1/2 tab (5mg  ) daily until gone.  Continue on current regimen  Follow up Dr. Elsworth Soho  In 2-3 months and As needed

## 2014-03-30 NOTE — Progress Notes (Signed)
Subjective:    Patient ID: Shawna [name], female    DOB: 23-Jan-1946, 69 y.o.   MRN: [id number]  HPI  69 year old ex smoker with COPD -gold B, RUL lobectomy in April 2013 for benign lesion ([place]) for FU of persistent dyspnea..  She has been on home O2 since 08/2011 (by PCP)  Quit smoking 05/2011 but relapsed in 2014  ABGs showed compensated hypercarbia.   Due to persistent dyspnea & hoarseness - advair/ breo was held  Pt in hosp 69 times since 11/2012  She received multiple rounds of antibiotics & steroids over past few months, ENT eval neg Persistent dyspnea -seems to have significant component of anxiety -increase in xanax to tid helped  Significant tests/ events  3/ 2013 PFTs - DLCO 50%, FEV1 1.68 - 64% (pre-op)  She presented with PET positive Lt lower lung nodule increasing in size, underwent bronchoscopy 01/2013 and developed Lt PTX after procedure.  Pathology - showed atypical cells. BAL showed M.Cat -treated  Additional concern of note was of on-going hyponatremia. Her urine lytes, cortisol, and Tsh were nml, HCTZ was stopped.  pelvic US.  - PET positive area on left ovary not seen  Interim -    CT 1/22 /15 showed Recurrent LLL atelectasis without obstructive lesion  Echo nov 2013 nml LV function   CT chest 07/2013 - 1.2 x 1.2 cm irregular left lower lobe pulmonary nodule continues to progress comparing back over multiple studies to 05/10/2011. 12/25/2012 -measured 8 x 10 mm.  PFTs 07/2013  severe airway obstruction with FEV1 of 0.95-37% and DLCO of 38%  03/30/2014  Chief Complaint  Patient presents with  . Follow-up    RA pt here for HFU - nonproductive cough SOB    01/25/2014 Acute OV >>levaquin + pred taper   Took prednisone taper last week.  Feels better Denies any f/c/s, n/v/d, hemoptysis, PND, leg swelling Remains on Advair -taking only once daily and Duoneb Four times a day . Hoarseness has not come back  Seen by Dr. Valere Dross Radiation Oncology , w/  repeat +PET along the Left upper mediastinus superior to the left hilum . Seen by Dr. Inda Merlin 09/2013 w/ plans to follow nodule serially as we do not have tissue dx to proceede with chemo. Case was discussed at thoracic oncology conference  03/30/2014 post hospital follow-up-COPD and Lung nodule  Patient was admitted December 69 2 December 31 for acute COPD exacerbation She was treated with IV antibiotics, steroids and nebulized bronchodilators. Viral panels were negative Palliative care team consult. The patient was changed to a DO NOT RESUSCITATE CODE STATUS She was given morphine for shortness of breath Discharged on a steroid taper with instructions to taper down to 10 mg and hold Since discharge, she is slowly improving. Continues to feel weak with intermittent shortness of breath. She continues to have cough , mostly nonproductive. Since discharge she is feeling some better.  Morphine is really helping her dyspnea.  On O2 at 2ll/m.  Feels her QOL has really improved.  Most recent CT chest in Dec 2015 showed stable lung nodule  She denied any chest pain, orthopnea, PND or leg swelling.    Past Medical History  Diagnosis Date  . COPD (chronic obstructive pulmonary disease)   . HTN (hypertension)   . B12 deficiency   . OA (osteoarthritis)   . Hypokalemia   . Mass of lung   . GERD (gastroesophageal reflux disease)   . Diverticulosis of colon (without mention of  hemorrhage)   . 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 Pulmonary nodule      Review of Systems neg for any significant sore throat, dysphagia, itching, sneezing, nasal congestion or excess/ purulent secretions, fever, chills, sweats, unintended wt loss, pleuritic or exertional cp, hempoptysis, orthopnea pnd or change in chronic leg swelling. Also denies presyncope, palpitations, heartburn, abdominal pain, nausea, vomiting, diarrhea or change in bowel or urinary habits, dysuria,hematuria, rash, arthralgias,  visual complaints, headache, numbness weakness or ataxia.     Objective:   Physical Exam  Gen. Pleasant, well-nourished, in no distress, elderly on O2  ENT - no lesions, no post nasal drip Neck: No JVD, no thyromegaly, no carotid bruits Lungs: no use of accessory muscles, no dullness to percussion, decreased BS in bases  Cardiovascular: Rhythm regular, heart sounds  normal, no murmurs or gallops, no peripheral edema Musculoskeletal: No deformities, no cyanosis or clubbing        Assessment & Plan:

## 2014-04-07 ENCOUNTER — Other Ambulatory Visit: Payer: Self-pay | Admitting: Pulmonary Disease

## 2014-04-08 ENCOUNTER — Inpatient Hospital Stay: Payer: Medicare Other | Admitting: Adult Health

## 2014-04-19 ENCOUNTER — Other Ambulatory Visit: Payer: Self-pay | Admitting: Internal Medicine

## 2014-04-20 ENCOUNTER — Telehealth: Payer: Self-pay | Admitting: Pulmonary Disease

## 2014-04-20 MED ORDER — PREDNISONE 10 MG PO TABS
10.0000 mg | ORAL_TABLET | Freq: Every day | ORAL | Status: DC
Start: 2014-04-20 — End: 2014-07-06

## 2014-04-20 NOTE — Telephone Encounter (Signed)
Okay to refill-5 mg every 4 hours when necessary

## 2014-04-20 NOTE — Telephone Encounter (Signed)
Discussed with TP: okay to increase prednisone to 10mg  daily until seen by RA in March (will need to schedule this).  If symptoms do not improve or they worsen, will need to call the office for sooner follow up or seek emergency care.  Thanks.  Called spoke with patient and discussed the above with her.  She is requesting a refill on her prednisone sent to [facility] in [place].  Pt is aware to contact the office if her symptoms do not improve or they worsen.  Appt has been scheduled with RA on 3.16.16 @ 1045.  Prednisone sent.  Dr Elsworth Soho, pt was given a script for liquid morphine at her most recent discharge.  She reports this helped her dyspnea a great deal and only takes PRN.  She is requesting a refill on this >> as she is not on Hospice the rx cannot be faxed but she would like to have it mailed.  She was given 522mL 5-32mL q4h prn.  Please advise, thank you.

## 2014-04-20 NOTE — Telephone Encounter (Signed)
Spoke with pt. States that since decreasing her prednisone 5mg  daily, her breathing has gotten worse. SOB is more pronounced, while on the phone she had to take a break to breath twice. Using her oxygen with minimal relief, she has had to turn it up at times. Pt is wanting to increase her prednisone to 10mg  daily.  TP  - please advise. Thanks.

## 2014-04-21 MED ORDER — MORPHINE SULFATE 10 MG/5ML PO SOLN: 5.0000 mg | ORAL | Status: DC | PRN

## 2014-04-21 NOTE — Telephone Encounter (Signed)
RX printed and placed for RA to sign.  LMOM for pt that rx will be mailed once signed.

## 2014-05-19 ENCOUNTER — Telehealth: Payer: Self-pay | Admitting: Pulmonary Disease

## 2014-05-19 MED ORDER — PREDNISONE 10 MG PO TABS: ORAL_TABLET | ORAL | Status: DC

## 2014-05-19 MED ORDER — DOXYCYCLINE HYCLATE 100 MG PO TABS: 100.0000 mg | ORAL_TABLET | Freq: Two times a day (BID) | ORAL | Status: DC

## 2014-05-19 NOTE — Telephone Encounter (Signed)
Rx's have been sent in per [facility]. Pt is aware. Nothing further was needed.

## 2014-05-19 NOTE — Telephone Encounter (Signed)
Spoke with pt. Reports dry cough, congestion, wheezing, SOB. Denies chest tightness and fever. Onset was 2 days ago. Would like something called in.  [facility] - please advise. Thanks.

## 2014-05-19 NOTE — Telephone Encounter (Signed)
Ok to call in doxy 100 bid for 5 days, and 8 day prednisone taper.  She needs visit with alva or TP in 3 weeks.

## 2014-06-02 ENCOUNTER — Encounter: Payer: Self-pay | Admitting: Pulmonary Disease

## 2014-06-02 ENCOUNTER — Ambulatory Visit (INDEPENDENT_AMBULATORY_CARE_PROVIDER_SITE_OTHER): Payer: Medicare Other | Admitting: Pulmonary Disease

## 2014-06-02 VITALS — BP 110/80 | HR 92 | Temp 98.0°F | Ht 66.0 in | Wt 122.0 lb

## 2014-06-02 DIAGNOSIS — J9610 Chronic respiratory failure, unspecified whether with hypoxia or hypercapnia: Secondary | ICD-10-CM

## 2014-06-02 DIAGNOSIS — J441 Chronic obstructive pulmonary disease with (acute) exacerbation: Secondary | ICD-10-CM

## 2014-06-02 DIAGNOSIS — J9620 Acute and chronic respiratory failure, unspecified whether with hypoxia or hypercapnia: Secondary | ICD-10-CM

## 2014-06-02 DIAGNOSIS — R0600 Dyspnea, unspecified: Secondary | ICD-10-CM

## 2014-06-02 MED ORDER — TIOTROPIUM BROMIDE-OLODATEROL 2.5-2.5 MCG/ACT IN AERS: 2.5000 ug | INHALATION_SPRAY | Freq: Every day | RESPIRATORY_TRACT | Status: DC

## 2014-06-02 MED ORDER — PREDNISONE 10 MG PO TABS: ORAL_TABLET | ORAL | Status: DC

## 2014-06-02 MED ORDER — TIOTROPIUM BROMIDE-OLODATEROL 2.5-2.5 MCG/ACT IN AERS: 2.5000 ug | INHALATION_SPRAY | Freq: Every day | RESPIRATORY_TRACT | Status: AC

## 2014-06-02 NOTE — Patient Instructions (Signed)
OK to increase morphine to 5 mg twice daily STOP advair & spiriva Trial of Stiolto -sample Prednisone 10 mg tabs  Take 2 tabs daily with food x 7ds, then 1 tab daily with food  CT chest in June 2016

## 2014-06-02 NOTE — Progress Notes (Signed)
Subjective:    Patient ID: Shawna [name] Doctor, female    DOB: 12/04/1945, 69 y.o.   MRN: [id number]  HPI  69 year old ex smoker with COPD -gold B, for FU of persistent dyspnea.  RUL lobectomy in April 2013 for benign lesion (hendrickson) She has been on home O2 since 08/2011 (by PCP)  Quit smoking 05/2011 but relapsed in 2014  ABGs showed compensated hypercarbia.   Due to persistent dyspnea & hoarseness - advair/ breo was held, ENT eval neg Persistent dyspnea , in spite of multiple rounds of antibiotics and steroids- significant component of anxiety -increase in xanax to tid helped, finally added low-dose morphine after hospitalization 02/2014  Significant tests/ events  3/ 2013 PFTs - DLCO 50%, FEV1 1.68 - 64% (pre-op)  PFTs 07/2013  severe airway obstruction with FEV1 of 0.95-37% and DLCO of 38%  12/2012  PET positive Lt lower lung nodule increasing in size, underwent bronchoscopy 01/2013 and developed Lt PTX after procedure.  Pathology - showed atypical cells. BAL showed M.Cat -treated  Hyponatremia wu - urine lytes, cortisol, and Tsh were nml, HCTZ was stopped.  pelvic US.  - PET positive area on left ovary not seen  Interim -    CT 1/22 /15 showed Recurrent LLL atelectasis without obstructive lesion   CT chest 07/2013 - 1.2 x 1.2 cm irregular left lower lobe pulmonary nodule continues to progress comparing back over multiple studies to 05/10/2011. 12/25/2012 -measured 8 x 10 mm.  PET 08/2013 Three intensely hypermetabolic lesions along the left upper mediastinum superior to the left hilum are concerning for carcinoma. Difficult to define soft tissue lesions to correspond these foci ? Periaortic LNs, ovarian process appears benign  01/2014 Seen by Dr. Valere Dross Radiation Oncology &  Dr. Inda Merlin 09/2013 w/ plans to follow nodule serially - Case was discussed at thoracic oncology conference  02/2014 CT chest - no change in left lower lobe nodule    06/02/2014  Chief Complaint    Patient presents with  . Follow-up    pt complains of sob daily, non-productive cough, wheezing. Pt has pain in right side where procedure was performed.   Accompanied by daughter 05/19/14 DOXY + pred Patient was admitted December 26 2 December 31 for acute COPD exacerbation She was treated with IV antibiotics, steroids and nebulized bronchodilators. Viral panels were negative  DO NOT RESUSCITATE CODE STATUS Morphine was really helping her dyspnea- but now she feels that she may be getting immune to this.  On O2 at 2ll/m.  Feels her QOL has worsened again She is back on advair and again complains of hoarseness  She denied any chest pain, orthopnea, PND or leg swelling.   Review of Systems neg for any significant sore throat, dysphagia, itching, sneezing, nasal congestion or excess/ purulent secretions, fever, chills, sweats, unintended wt loss, pleuritic or exertional cp, hempoptysis, orthopnea pnd or change in chronic leg swelling. Also denies presyncope, palpitations, heartburn, abdominal pain, nausea, vomiting, diarrhea or change in bowel or urinary habits, dysuria,hematuria, rash, arthralgias, visual complaints, headache, numbness weakness or ataxia.     Objective:   Physical Exam  Gen. Pleasant, well-nourished, in no distress ENT - no lesions, no post nasal drip Neck: No JVD, no thyromegaly, no carotid bruits Lungs: no use of accessory muscles, no dullness to percussion, decreased without rales or rhonchi  Cardiovascular: Rhythm regular, heart sounds  normal, no murmurs or gallops, no peripheral edema Musculoskeletal: No deformities, no cyanosis or clubbing  Assessment & Plan:

## 2014-06-04 NOTE — Assessment & Plan Note (Signed)
Increase morphine to 5 mg twice daily Stay on oxygen

## 2014-06-04 NOTE — Assessment & Plan Note (Signed)
CT chest in June 2016- left lower lobe nodule is stable Diagnosis of malignancy is not certain yet

## 2014-06-04 NOTE — Assessment & Plan Note (Signed)
OK to increase morphine to 5 mg twice daily STOP advair & spiriva Trial of Stiolto -sample Prednisone 10 mg tabs  Take 2 tabs daily with food x 7ds, then 1 tab daily with food

## 2014-06-10 ENCOUNTER — Telehealth: Payer: Self-pay | Admitting: Pulmonary Disease

## 2014-06-10 MED ORDER — MORPHINE SULFATE 10 MG/5ML PO SOLN: 5.0000 mg | ORAL | Status: DC | PRN

## 2014-06-10 NOTE — Telephone Encounter (Signed)
Pt aware. Rx printed and placed on CY cart to sign.  This will need to be mailed to the patient when signed.  Envelope attached.  Nothing further needed.

## 2014-06-10 NOTE — Telephone Encounter (Signed)
Ok to refill 

## 2014-06-10 NOTE — Telephone Encounter (Signed)
Pt is needing refill on Morphine. At her last OV with RA, 06/02/14 he instructed her to increase to 5mg  BID. Medication was last refilled on 04/21/14.   CY - please advise if you would be willing to refill this. Thanks.

## 2014-06-14 ENCOUNTER — Telehealth: Payer: Self-pay | Admitting: Pulmonary Disease

## 2014-06-14 MED ORDER — LEVOFLOXACIN 500 MG PO TABS: 500.0000 mg | ORAL_TABLET | Freq: Every day | ORAL | Status: DC

## 2014-06-14 NOTE — Telephone Encounter (Signed)
Spoke with the pt  She is c/o cough and chest feeling sore for the past 3 days  Cough is prod with green/yellow sputum  She states no wheezing or increased SOB  Already taking pred taper  She is asking for abx to be called in  RA, please advise thanks! Allergies  Allergen Reactions  . Aspirin Swelling    Lips and face swelled.  [name] [Oxycodone-Aspirin] Other (See Comments)    "went out of my head"

## 2014-06-14 NOTE — Telephone Encounter (Signed)
Called and spoke to pt. Informed pt of the recs per RA. Rx sent to preferred pharmacy. Pt verbalized understanding. Pt requesting update of morphine that was placed in mail. Informed pt per last phone note from 06/10/2014 the rx was placed in outgoing mail on 3/24 or 3/25. Pt aware to call back if she has not received the rx in a couple days. Nothing further needed at this time.

## 2014-06-14 NOTE — Telephone Encounter (Signed)
levaquin 500 daily x 7 days

## 2014-06-16 ENCOUNTER — Telehealth: Payer: Self-pay | Admitting: Pulmonary Disease

## 2014-06-16 MED ORDER — MORPHINE SULFATE 10 MG/5ML PO SOLN: 5.0000 mg | ORAL | Status: DC | PRN

## 2014-06-16 NOTE — Telephone Encounter (Signed)
I will sign another script for her to pick up.We need to document how controlled drug scripts are handled. If you leave a script for the doc to sign, we need to be sure it gets back to the nurse that generated it and not just left lying around. I think I did the first one, but can't speak to where it went after signed .

## 2014-06-16 NOTE — Telephone Encounter (Signed)
Pt states that she will be out of her current Rx tomorrow (06/16/14)  Per previous notes in chart, rx was printed and placed on CY cart with an addressed envelope to be mailed once signed.  Pt states that she has not received the Rx as her mail has already run today. Advised her that I am unsure when the Rx was placed in the mail, not sure if it was placed in mail pick up on 06/10/14 or if it was picked up to be sent 3/28 or 3/29. Patient is very anxious and worried that she is going to be run out of her medication and be in trouble.  Pt is wanting to know if her grand daugher can come pick up another Rx. I advised her that with this type of medication we have to be careful - if Rx was mailed we cannot give her another Rx as this is a controlled substance. I advised her that she will be contact back today once this is sorted out and we can give her a firm answer once we speak to Dr Annamaria Boots.   Virl Cagey, CMA at 06/10/2014 12:00 PM     Status: Signed       Expand All Collapse All   Pt aware. Rx printed and placed on CY cart to sign.  This will need to be mailed to the patient when signed.  Envelope attached.  Nothing further needed.       Please advise Dr Annamaria Boots if you remember signing this and who you may have given this to. Thanks.

## 2014-06-16 NOTE — Telephone Encounter (Signed)
Spoke with patient, aware that we are printing another Rx for her today. This can be picked up today by her grand daughter(Jessica).  Patient aware that when Rx is received in mail to ensure that it be torn up and discarded as she will not be able to fill it.  Previous Rx delayed in mail, CY okayed for another Rx to be sent into pharmacy as patient will be out tomorrow.  I personally witnessed Dr Annamaria Boots sign Rx and this was placed up front by me to be picked up. Nothing further needed.

## 2014-06-16 NOTE — Telephone Encounter (Signed)
Spoke with pt and she states she has not received Morphine rx that was supposed to have been mailed on 06/09/13 ( see phone note 06/09/13).  She will be out of med by tomorrow.  Katie please advise if you remember CY signing this and putting it in outgoing mail.  Will need to address with doc of day if rx is not located.

## 2014-07-04 ENCOUNTER — Inpatient Hospital Stay (HOSPITAL_COMMUNITY)
Admission: EM | Admit: 2014-07-04 | Discharge: 2014-07-06 | DRG: 192 | Disposition: A | Discharge status: Home / Self Care | Payer: Medicare Other | Attending: Oncology | Admitting: Oncology

## 2014-07-04 ENCOUNTER — Emergency Department (HOSPITAL_COMMUNITY): Payer: Medicare Other

## 2014-07-04 ENCOUNTER — Encounter (HOSPITAL_COMMUNITY): Payer: Self-pay | Admitting: Emergency Medicine

## 2014-07-04 DIAGNOSIS — D696 Thrombocytopenia, unspecified: Secondary | ICD-10-CM | POA: Diagnosis present

## 2014-07-04 DIAGNOSIS — I495 Sick sinus syndrome: Secondary | ICD-10-CM | POA: Diagnosis present

## 2014-07-04 DIAGNOSIS — I10 Essential (primary) hypertension: Secondary | ICD-10-CM | POA: Diagnosis present

## 2014-07-04 DIAGNOSIS — M1990 Unspecified osteoarthritis, unspecified site: Secondary | ICD-10-CM | POA: Diagnosis present

## 2014-07-04 DIAGNOSIS — Z886 Allergy status to analgesic agent status: Secondary | ICD-10-CM

## 2014-07-04 DIAGNOSIS — Z66 Do not resuscitate: Secondary | ICD-10-CM | POA: Diagnosis present

## 2014-07-04 DIAGNOSIS — R911 Solitary pulmonary nodule: Secondary | ICD-10-CM | POA: Diagnosis present

## 2014-07-04 DIAGNOSIS — R600 Localized edema: Secondary | ICD-10-CM | POA: Diagnosis present

## 2014-07-04 DIAGNOSIS — Z87891 Personal history of nicotine dependence: Secondary | ICD-10-CM | POA: Diagnosis not present

## 2014-07-04 DIAGNOSIS — E876 Hypokalemia: Secondary | ICD-10-CM | POA: Diagnosis present

## 2014-07-04 DIAGNOSIS — J9610 Chronic respiratory failure, unspecified whether with hypoxia or hypercapnia: Secondary | ICD-10-CM | POA: Diagnosis present

## 2014-07-04 DIAGNOSIS — K219 Gastro-esophageal reflux disease without esophagitis: Secondary | ICD-10-CM | POA: Diagnosis present

## 2014-07-04 DIAGNOSIS — Z9981 Dependence on supplemental oxygen: Secondary | ICD-10-CM

## 2014-07-04 DIAGNOSIS — I4891 Unspecified atrial fibrillation: Secondary | ICD-10-CM | POA: Diagnosis not present

## 2014-07-04 DIAGNOSIS — Z902 Acquired absence of lung [part of]: Secondary | ICD-10-CM | POA: Diagnosis present

## 2014-07-04 DIAGNOSIS — J441 Chronic obstructive pulmonary disease with (acute) exacerbation: Principal | ICD-10-CM | POA: Diagnosis present

## 2014-07-04 DIAGNOSIS — R05 Cough: Secondary | ICD-10-CM | POA: Diagnosis not present

## 2014-07-04 LAB — CBC
HEMATOCRIT: 46 % (ref 36.0–46.0)
Hemoglobin: 14.8 g/dL (ref 12.0–15.0)
MCH: 30.7 pg (ref 26.0–34.0)
MCHC: 32.2 g/dL (ref 30.0–36.0)
MCV: 95.4 fL (ref 78.0–100.0)
Platelets: 102 10*3/uL — ABNORMAL LOW (ref 150–400)
RBC: 4.82 MIL/uL (ref 3.87–5.11)
RDW: 14.5 % (ref 11.5–15.5)
WBC: 8 10*3/uL (ref 4.0–10.5)

## 2014-07-04 LAB — BASIC METABOLIC PANEL
ANION GAP: 12 (ref 5–15)
BUN: 21 mg/dL (ref 6–23)
CALCIUM: 9 mg/dL (ref 8.4–10.5)
CHLORIDE: 93 mmol/L — AB (ref 96–112)
CO2: 33 mmol/L — ABNORMAL HIGH (ref 19–32)
Creatinine, Ser: 1.05 mg/dL (ref 0.50–1.10)
GFR calc Af Amer: 62 mL/min — ABNORMAL LOW (ref >90)
GFR calc non Af Amer: 53 mL/min — ABNORMAL LOW (ref >90)
GLUCOSE: 81 mg/dL (ref 70–99)
POTASSIUM: 4.1 mmol/L (ref 3.5–5.1)
SODIUM: 138 mmol/L (ref 135–145)

## 2014-07-04 LAB — I-STAT TROPONIN, ED: Troponin i, poc: 0.01 ng/mL (ref 0.00–0.08)

## 2014-07-04 LAB — BRAIN NATRIURETIC PEPTIDE: B Natriuretic Peptide: 178.1 pg/mL — ABNORMAL HIGH (ref 0.0–100.0)

## 2014-07-04 MED ORDER — IPRATROPIUM-ALBUTEROL 0.5-2.5 (3) MG/3ML IN SOLN
3.0000 mL | Freq: Four times a day (QID) | RESPIRATORY_TRACT | Status: DC
  Administered 2014-07-04: 3 mL via RESPIRATORY_TRACT
  Filled 2014-07-04: qty 3

## 2014-07-04 MED ORDER — ALBUTEROL (5 MG/ML) CONTINUOUS INHALATION SOLN
10.0000 mg/h | INHALATION_SOLUTION | Freq: Once | RESPIRATORY_TRACT | Status: AC
  Administered 2014-07-04: 10 mg/h via RESPIRATORY_TRACT
  Filled 2014-07-04: qty 20

## 2014-07-04 MED ORDER — ACETAMINOPHEN 500 MG PO TABS
500.0000 mg | ORAL_TABLET | Freq: Four times a day (QID) | ORAL | Status: DC | PRN
  Administered 2014-07-05: 500 mg via ORAL
  Filled 2014-07-04: qty 1

## 2014-07-04 MED ORDER — MORPHINE SULFATE 10 MG/5ML PO SOLN: 5.0000 mg | ORAL | Status: DC | PRN

## 2014-07-04 MED ORDER — GUAIFENESIN ER 600 MG PO TB12
600.0000 mg | ORAL_TABLET | Freq: Two times a day (BID) | ORAL | Status: DC | PRN
  Filled 2014-07-04: qty 1

## 2014-07-04 MED ORDER — MORPHINE SULFATE (CONCENTRATE) 10 MG/0.5ML PO SOLN: 5.0000 mg | ORAL | Status: DC | PRN

## 2014-07-04 MED ORDER — METHYLPREDNISOLONE SODIUM SUCC 125 MG IJ SOLR
125.0000 mg | Freq: Once | INTRAMUSCULAR | Status: AC
  Administered 2014-07-04: 125 mg via INTRAVENOUS
  Filled 2014-07-04: qty 2

## 2014-07-04 MED ORDER — DEXTROSE 5 % IV SOLN
1.0000 g | INTRAVENOUS | Status: DC
  Administered 2014-07-04: 1 g via INTRAVENOUS
  Filled 2014-07-04 (×2): qty 1

## 2014-07-04 MED ORDER — LORATADINE 10 MG PO TABS
10.0000 mg | ORAL_TABLET | Freq: Every day | ORAL | Status: DC
  Administered 2014-07-05 – 2014-07-06 (×2): 10 mg via ORAL
  Filled 2014-07-04 (×2): qty 1

## 2014-07-04 MED ORDER — IPRATROPIUM BROMIDE 0.02 % IN SOLN
0.5000 mg | Freq: Once | RESPIRATORY_TRACT | Status: AC
  Administered 2014-07-04: 0.5 mg via RESPIRATORY_TRACT
  Filled 2014-07-04: qty 2.5

## 2014-07-04 MED ORDER — BIOTENE DRY MOUTH MT LIQD: 1.0000 "application " | Freq: Two times a day (BID) | OROMUCOSAL | Status: DC | PRN

## 2014-07-04 MED ORDER — POLYETHYLENE GLYCOL 3350 17 G PO PACK
17.0000 g | PACK | ORAL | Status: DC
  Administered 2014-07-06: 17 g via ORAL
  Filled 2014-07-04: qty 1

## 2014-07-04 MED ORDER — ALBUTEROL SULFATE (2.5 MG/3ML) 0.083% IN NEBU: 2.5000 mg | INHALATION_SOLUTION | RESPIRATORY_TRACT | Status: DC | PRN

## 2014-07-04 MED ORDER — VERAPAMIL HCL ER 180 MG PO TBCR
180.0000 mg | EXTENDED_RELEASE_TABLET | Freq: Every day | ORAL | Status: DC
  Administered 2014-07-04 – 2014-07-05 (×2): 180 mg via ORAL
  Filled 2014-07-04 (×3): qty 1

## 2014-07-04 MED ORDER — MELOXICAM 15 MG PO TABS
15.0000 mg | ORAL_TABLET | Freq: Every day | ORAL | Status: DC
  Administered 2014-07-05 – 2014-07-06 (×2): 15 mg via ORAL
  Filled 2014-07-04 (×2): qty 1

## 2014-07-04 MED ORDER — IPRATROPIUM-ALBUTEROL 0.5-2.5 (3) MG/3ML IN SOLN
3.0000 mL | Freq: Four times a day (QID) | RESPIRATORY_TRACT | Status: DC
  Administered 2014-07-05 (×3): 3 mL via RESPIRATORY_TRACT
  Filled 2014-07-04 (×3): qty 3

## 2014-07-04 MED ORDER — PANTOPRAZOLE SODIUM 40 MG PO TBEC
40.0000 mg | DELAYED_RELEASE_TABLET | Freq: Every day | ORAL | Status: DC
  Administered 2014-07-05 – 2014-07-06 (×2): 40 mg via ORAL
  Filled 2014-07-04 (×2): qty 1

## 2014-07-04 MED ORDER — TIOTROPIUM BROMIDE-OLODATEROL 2.5-2.5 MCG/ACT IN AERS
2.5000 ug | INHALATION_SPRAY | Freq: Every day | RESPIRATORY_TRACT | Status: DC
  Administered 2014-07-06: 2.5 ug via RESPIRATORY_TRACT

## 2014-07-04 MED ORDER — MORPHINE SULFATE (CONCENTRATE) 10 MG/0.5ML PO SOLN: 5.0000 mg | Freq: Two times a day (BID) | ORAL | Status: DC | PRN

## 2014-07-04 MED ORDER — CARVEDILOL 3.125 MG PO TABS
3.1250 mg | ORAL_TABLET | Freq: Every day | ORAL | Status: DC
Start: 2014-07-05 — End: 2014-07-06
  Administered 2014-07-05 – 2014-07-06 (×2): 3.125 mg via ORAL
  Filled 2014-07-04 (×2): qty 1

## 2014-07-04 MED ORDER — ALPRAZOLAM 0.25 MG PO TABS
0.2500 mg | ORAL_TABLET | Freq: Two times a day (BID) | ORAL | Status: DC
  Administered 2014-07-04 – 2014-07-06 (×4): 0.25 mg via ORAL
  Filled 2014-07-04 (×4): qty 1

## 2014-07-04 MED ORDER — PREDNISONE 10 MG PO TABS
10.0000 mg | ORAL_TABLET | Freq: Every day | ORAL | Status: DC
  Administered 2014-07-05: 10 mg via ORAL
  Filled 2014-07-04 (×2): qty 1

## 2014-07-04 MED ORDER — SODIUM CHLORIDE 0.9 % IJ SOLN
3.0000 mL | Freq: Two times a day (BID) | INTRAMUSCULAR | Status: DC
  Administered 2014-07-04 – 2014-07-06 (×4): 3 mL via INTRAVENOUS

## 2014-07-04 NOTE — ED Notes (Signed)
Report attempt x 1 

## 2014-07-04 NOTE — Progress Notes (Signed)
ANTIBIOTIC CONSULT NOTE - INITIAL  Pharmacy Consult for cefepime Indication: COPD  Allergies  Allergen Reactions  . Aspirin Swelling    Lips and face swelled.  [name] [Oxycodone-Aspirin] Other (See Comments)    "went out of my head"  . Advair Diskus [Fluticasone-Salmeterol] Other (See Comments)    laryngitis    Patient Measurements: Height: 5\' 6"  (167.6 cm) Weight: 116 lb 14.4 oz (53.025 kg) IBW/kg (Calculated) : 59.3 Adjusted Body Weight:   Vital Signs: Temp: 98.8 F (37.1 C) (04/17 2032) Temp Source: Oral (04/17 2032) BP: 113/70 mmHg (04/17 2032) Pulse Rate: 107 (04/17 2032) Intake/Output from previous day:   Intake/Output from this shift:    Labs:  Recent Labs  07/04/14 1648  WBC 8.0  HGB 14.8  PLT 102*  CREATININE 1.05   Estimated Creatinine Clearance: 42.9 mL/min (by C-G formula based on Cr of 1.05). No results for input(s): VANCOTROUGH, VANCOPEAK, VANCORANDOM, GENTTROUGH, GENTPEAK, GENTRANDOM, TOBRATROUGH, TOBRAPEAK, TOBRARND, AMIKACINPEAK, AMIKACINTROU, AMIKACIN in the last 72 hours.   Microbiology: No results found for this or any previous visit (from the past 720 hour(s)).  Medical History: Past Medical History  Diagnosis Date  . COPD (chronic obstructive pulmonary disease)   . HTN (hypertension)   . B12 deficiency   . OA (osteoarthritis)   . Hypokalemia   . Mass of lung   . GERD (gastroesophageal reflux disease)   . Diverticulosis of colon (without mention of hemorrhage)   . 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 Pulmonary nodule     Medications:  Prescriptions prior to admission  Medication Sig Dispense Refill Last Dose  . acetaminophen (TYLENOL) 500 MG tablet Take 500 mg by mouth every 6 (six) hours as needed for moderate pain.    07/04/2014 at Unknown time  . albuterol (PROVENTIL) (2.5 MG/3ML) 0.083% nebulizer solution Take 2.5 mg by nebulization 4 (four) times daily. For shortness of breath   07/04/2014 at Unknown time   . Albuterol Sulfate (PROAIR RESPICLICK) 967 (90 BASE) MCG/ACT AEPB Inhale 2 puffs into the lungs every 4 (four) hours as needed. (Patient taking differently: Inhale 2 puffs into the lungs every 4 (four) hours as needed (for wheezing and shortness of breath). ) 1 each 5 07/04/2014 at Unknown time  . ALPRAZolam (XANAX) 0.25 MG tablet Take 0.25 mg by mouth 2 (two) times daily.   07/04/2014 at Unknown time  . carvedilol (COREG) 3.125 MG tablet Take 3.125 mg by mouth daily.   07/04/2014 at 0915  . Dentifrices (BIOTENE DRY MOUTH) GEL Place 1 application onto teeth 2 (two) times daily as needed (dry mouth).   07/04/2014 at Unknown time  . esomeprazole (NEXIUM) 20 MG capsule Take 20 mg by mouth daily at 12 noon.   07/04/2014 at Unknown time  . fluticasone (FLONASE) 50 MCG/ACT nasal spray Place 2 sprays into both nostrils as needed for allergies.    07/04/2014 at Unknown time  . furosemide (LASIX) 20 MG tablet TAKE ONE TO TWO TABLETS BY MOUTH ONCE DAILY AS NEEDED FOR EDEMA OR  FLUID (Patient taking differently: TAKE ONE TaBLET BY MOUTH twice DAILY AS NEEDED FOR EDEMA OR  FLUID) 30 tablet 0 07/04/2014 at Unknown time  . guaiFENesin (MUCINEX) 600 MG 12 hr tablet Take 1 tablet (600 mg total) by mouth 2 (two) times daily as needed. (Patient taking differently: Take 600 mg by mouth 2 (two) times daily. )   07/04/2014 at Unknown time  . ipratropium (ATROVENT) 0.02 % nebulizer solution Take 2.5  mLs (0.5 mg total) by nebulization 4 (four) times daily. Dx 496 360 mL 3 07/04/2014 at Unknown time  . KLOR-CON M20 20 MEQ tablet Take 20 mEq by mouth daily.    07/04/2014 at Unknown time  . loratadine (CLARITIN) 10 MG tablet Take 1 tablet (10 mg total) by mouth daily. 30 tablet 0 07/04/2014 at Unknown time  . meloxicam (MOBIC) 15 MG tablet Take 15 mg by mouth daily.    07/04/2014 at Unknown time  . morphine 10 MG/5ML solution Take 2.5 mLs (5 mg total) by mouth every 4 (four) hours as needed (shortness of breath). 500 mL 0 07/04/2014 at  Unknown time  . polyethylene glycol (MIRALAX / GLYCOLAX) packet Take 17 g by mouth every other day.   07/04/2014 at Unknown time  . predniSONE (DELTASONE) 10 MG tablet Take 1 tablet (10 mg total) by mouth daily with breakfast. Until seen in the office on 06/02/14 (Patient taking differently: Take 10 mg by mouth daily with breakfast. Until seen in the office on 07/05/14) 30 tablet 1 07/04/2014 at Unknown time  . Tiotropium Bromide-Olodaterol (STIOLTO RESPIMAT) 2.5-2.5 MCG/ACT AERS Inhale 2.5 mcg into the lungs daily. Inhale 2 puffs once daily 1 Inhaler 4 07/04/2014 at Unknown time  . verapamil (CALAN-SR) 180 MG CR tablet Take 180 mg by mouth at bedtime.    07/03/2014 at Unknown time  . cyanocobalamin (,VITAMIN B-12,) 1000 MCG/ML injection Inject 1,000 mcg into the muscle every 30 (thirty) days. Given on the 10th of each month   06/28/2014  . levofloxacin (LEVAQUIN) 500 MG tablet Take 1 tablet (500 mg total) by mouth daily. (Patient not taking: Reported on 07/04/2014) 7 tablet 0 Not Taking at Unknown time  . predniSONE (DELTASONE) 10 MG tablet 2 tablets with food for 7 days, then 1 tablet daily (Patient not taking: Reported on 07/04/2014) 60 tablet 1 Not Taking at Unknown time   Scheduled:  . ALPRAZolam  0.25 mg Oral BID  . [START ON 07/05/2014] carvedilol  3.125 mg Oral Daily  . ceFEPime (MAXIPIME) IV  1 g Intravenous Q24H  . ipratropium-albuterol  3 mL Nebulization Q6H  . [START ON 07/05/2014] loratadine  10 mg Oral Daily  . [START ON 07/05/2014] meloxicam  15 mg Oral Daily  . [START ON 07/05/2014] pantoprazole  40 mg Oral Daily  . [START ON 07/06/2014] polyethylene glycol  17 g Oral QODAY  . [START ON 07/05/2014] predniSONE  10 mg Oral Q breakfast  . sodium chloride  3 mL Intravenous Q12H  . Tiotropium Bromide-Olodaterol  2.5 mcg Inhalation Daily  . verapamil  180 mg Oral QHS   Assessment: 69 yo who came in today for SOB. Pt has stage 4 COPD. Starting empiric cefepime today. CrCl ~ 43 tonight. Scr looks  ok today.   Plan:   Cefepime 1g IV q24  Onnie Boer, PharmD Pager: [phone] 07/04/2014 9:44 PM

## 2014-07-04 NOTE — Progress Notes (Signed)
   07/04/14 2032  Vitals  Temp 98.8 F (37.1 C)  Temp Source Oral  BP 113/70 mmHg  BP Location Left Arm  BP Method Automatic  Patient Position (if appropriate) Lying  Pulse Rate (!) 107  Resp (!) 24  Oxygen Therapy  SpO2 98 %  O2 Device Nasal Cannula  O2 Flow Rate (L/min) 5 L/min  Height and Weight  Height 5\' 7"  (1.702 m)  Weight 53.025 kg (116 lb 14.4 oz)  Type of Scale Used Bed (pt unable to stand, very SOB)  Type of Weight Actual  BSA (Calculated - sq m) 1.58 sq meters  BMI (Calculated) 18.3  Weight in (lb) to have BMI = 25 159.3  Admitted pt to room 3E20 from ED via stretcher, pt oriented to room, call bell placed within reach, pt short of breath even at rest, advised to be bedrest for tonight, pt on 5L oxygen [place]. Daughter at bedside. Will continue to monitor.

## 2014-07-04 NOTE — ED Provider Notes (Signed)
CSN: [id number]     Arrival date & time 07/04/14  1640 History   First MD Initiated Contact with Patient 07/04/14 1642     Chief Complaint  Patient presents with  . Respiratory Distress    HPI Comments: Patient has a history of severe COPD.  She is chronically on home oxygen. Patient has noticed worsening symptoms over the past week. She was trying to hold out until she saw her pulmonary doctor.  She has an appointment early this coming week.  The symptoms continued to progress in severity. The patient called EMS.  Patient is a 69 y.o. female presenting with shortness of breath. The history is provided by the patient.  Shortness of Breath Severity:  Severe Onset quality:  Gradual Duration:  1 week Timing:  Constant Progression:  Worsening Chronicity:  Recurrent Context: not URI and not weather changes   Relieved by:  Nothing Worsened by:  Activity Ineffective treatments:  Inhaler Associated symptoms: cough, sputum production and wheezing   Associated symptoms: no abdominal pain, no chest pain, no fever, no sore throat and no vomiting     Past Medical History  Diagnosis Date  . COPD (chronic obstructive pulmonary disease)   . HTN (hypertension)   . B12 deficiency   . OA (osteoarthritis)   . Hypokalemia   . Mass of lung   . GERD (gastroesophageal reflux disease)   . Diverticulosis of colon (without mention of hemorrhage)   . 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 Pulmonary nodule    Past Surgical History  Procedure Laterality Date  . Carpal tunnel release      bilateral  . Tubal ligation      x 2  . Cervical spine surgery      plate with screws  . Lung surgery Right 07/06/11    RUL, per patient Dr Roxan Hockey  . Video bronchoscopy Bilateral 01/20/2013    Procedure: VIDEO BRONCHOSCOPY WITH FLUORO;  Surgeon: Rigoberto Noel, MD;  Location: [facility];  Service: Cardiopulmonary;  Laterality: Bilateral;  . Cataract extraction w/phaco Left 11/30/2013   Procedure: CATARACT EXTRACTION PHACO AND INTRAOCULAR LENS PLACEMENT (IOC);  Surgeon: Tonny Branch, MD;  Location: [facility];  Service: Ophthalmology;  Laterality: Left;  CDE:  11.35   Family History  Problem Relation Age of Onset  . Coronary artery disease Mother 72  . Alzheimer's disease Mother   . Colon cancer    . COPD Father    History  Substance Use Topics  . Smoking status: Former Smoker -- 1.00 packs/day for 40 years    Types: Cigarettes    Quit date: 06/30/2011  . Smokeless tobacco: Never Used     Comment: resumed for a short time  . Alcohol Use: No   OB History    No data available     Review of Systems  Constitutional: Negative for fever.  HENT: Negative for sore throat.   Respiratory: Positive for cough, sputum production, shortness of breath and wheezing.   Cardiovascular: Negative for chest pain.  Gastrointestinal: Negative for vomiting and abdominal pain.  All other systems reviewed and are negative.     Allergies  Aspirin and Percodan  Home Medications   Prior to Admission medications   Medication Sig Start Date End Date Taking? Authorizing Provider  acetaminophen (TYLENOL) 500 MG tablet Take 500 mg by mouth every 6 (six) hours as needed for moderate pain.     Historical Provider, MD  albuterol (PROVENTIL) (2.5 MG/3ML) 0.083% nebulizer solution  Take 2.5 mg by nebulization 4 (four) times daily. For shortness of breath 02/08/12   Jonetta Osgood, MD  Albuterol Sulfate (PROAIR RESPICLICK) 657 (90 BASE) MCG/ACT AEPB Inhale 2 puffs into the lungs every 4 (four) hours as needed. 03/08/14   Rigoberto Noel, MD  ALPRAZolam Duanne Moron) 0.25 MG tablet Take 0.25 mg by mouth 2 (two) times daily. 07/21/13   Rigoberto Noel, MD  carvedilol (COREG) 3.125 MG tablet Take 3.125 mg by mouth daily. 06/29/14   Historical Provider, MD  cyanocobalamin (,VITAMIN B-12,) 1000 MCG/ML injection Inject 1,000 mcg into the muscle every 30 (thirty) days. Given on the 10th of each month    Historical  Provider, MD  esomeprazole (NEXIUM) 20 MG capsule Take 20 mg by mouth daily at 12 noon.    Historical Provider, MD  fluticasone (FLONASE) 50 MCG/ACT nasal spray Place 2 sprays into both nostrils as needed for allergies.  02/13/13   Elsie Stain, MD  Fluticasone-Salmeterol (ADVAIR) 250-50 MCG/DOSE AEPB Inhale 1 puff into the lungs 2 (two) times daily.     Historical Provider, MD  furosemide (LASIX) 20 MG tablet TAKE ONE TO TWO TABLETS BY MOUTH ONCE DAILY AS NEEDED FOR EDEMA OR  FLUID 04/07/14   Rigoberto Noel, MD  guaiFENesin (MUCINEX) 600 MG 12 hr tablet Take 1 tablet (600 mg total) by mouth 2 (two) times daily as needed. 03/18/14   Rushil Sherrye Payor, MD  ipratropium (ATROVENT) 0.02 % nebulizer solution Take 2.5 mLs (0.5 mg total) by nebulization 4 (four) times daily. Dx 496 10/07/13   Rigoberto Noel, MD  KLOR-CON M20 20 MEQ tablet Take 20 mEq by mouth daily.  05/11/11   Historical Provider, MD  levofloxacin (LEVAQUIN) 500 MG tablet Take 1 tablet (500 mg total) by mouth daily. 06/14/14   Rigoberto Noel, MD  loratadine (CLARITIN) 10 MG tablet Take 1 tablet (10 mg total) by mouth daily. 02/08/12   Shanker Kristeen Mans, MD  meloxicam (MOBIC) 15 MG tablet Take 15 mg by mouth daily.     Historical Provider, MD  morphine 10 MG/5ML solution Take 2.5 mLs (5 mg total) by mouth every 4 (four) hours as needed (shortness of breath). 06/16/14   Deneise Lever, MD  polyethylene glycol (MIRALAX / GLYCOLAX) packet Take 17 g by mouth every other day.    Historical Provider, MD  predniSONE (DELTASONE) 10 MG tablet Take 1 tablet (10 mg total) by mouth daily with breakfast. Until seen in the office on 06/02/14 Patient not taking: Reported on 06/02/2014 04/20/14   Tammy S Parrett, NP  predniSONE (DELTASONE) 10 MG tablet 2 tablets with food for 7 days, then 1 tablet daily 06/02/14   Rigoberto Noel, MD  predniSONE (DELTASONE) 5 MG tablet Take 5 mg by mouth 2 (two) times daily. 05/10/14   Historical Provider, MD  Tiotropium  Bromide-Olodaterol (STIOLTO RESPIMAT) 2.5-2.5 MCG/ACT AERS Inhale 2.5 mcg into the lungs daily. Inhale 2 puffs once daily 06/02/14   Rigoberto Noel, MD  verapamil (CALAN-SR) 180 MG CR tablet Take 180 mg by mouth daily.    Historical Provider, MD   BP 151/99 mmHg  Pulse 110  Temp(Src) 98.7 F (37.1 C) (Oral)  Resp 20  Ht 5\' 6"  (1.676 m)  Wt 118 lb (53.524 kg)  BMI 19.05 kg/m2  SpO2 100% Physical Exam  Constitutional: No distress.  HENT:  Head: Normocephalic and atraumatic.  Right Ear: External ear normal.  Left Ear: External ear normal.  Eyes:  Conjunctivae are normal. Right eye exhibits no discharge. Left eye exhibits no discharge. No scleral icterus.  Neck: Neck supple. No tracheal deviation present.  Cardiovascular: Normal rate, regular rhythm and intact distal pulses.   Pulmonary/Chest: Accessory muscle usage present. No stridor. Tachypnea noted. She has decreased breath sounds. She has wheezes. She has no rales.  Patient is not having any difficulty speaking  Abdominal: Soft. Bowel sounds are normal. She exhibits no distension. There is no tenderness. There is no rebound and no guarding.  Musculoskeletal: She exhibits no edema or tenderness.       Right shoulder: She exhibits swelling.  Mild edema of the feet  Neurological: She is alert. She has normal strength. No cranial nerve deficit (no facial droop, extraocular movements intact, no slurred speech) or sensory deficit. She exhibits normal muscle tone. She displays no seizure activity. Coordination normal.  Skin: Skin is warm and dry. No rash noted. She is not diaphoretic.  Psychiatric: She has a normal mood and affect.  Nursing note and vitals reviewed.   ED Course  Procedures (including critical care time) Labs Review Labs Reviewed  BASIC METABOLIC PANEL - Abnormal; Notable for the following:    Chloride 93 (*)    CO2 33 (*)    GFR calc non Af Amer 53 (*)    GFR calc Af Amer 62 (*)    All other components within normal  limits  CBC - Abnormal; Notable for the following:    Platelets 102 (*)    All other components within normal limits  BRAIN NATRIURETIC PEPTIDE - Abnormal; Notable for the following:    B Natriuretic Peptide 178.1 (*)    All other components within normal limits  Randolm Idol, ED    Imaging Review Dg Chest Port 1 View  07/04/2014   CLINICAL DATA:  Respiratory distress for 1 week.  EXAM: PORTABLE CHEST - 1 VIEW  COMPARISON:  Chest radiograph 03/15/2014  FINDINGS: Monitoring leads overlie the patient. Normal cardiac and mediastinal contours. Stable postsurgical changes involving the right hemi thorax. No large consolidative pulmonary opacities. No pleural effusion or pneumothorax. AC joint degenerative changes.  IMPRESSION: Stable postoperative changes involving the right hemi thorax.  No acute cardiopulmonary process.   Electronically Signed   By: Lovey Newcomer M.D.   On: 07/04/2014 17:08     EKG Interpretation   Date/Time:  Sunday July 04 2014 16:46:49 EDT Ventricular Rate:  109 PR Interval:  109 QRS Duration: 148 QT Interval:  397 QTC Calculation: 535 R Axis:   -107 Text Interpretation:  Sinus tachycardia Ventricular premature complex  Right atrial enlargement Nonspecific IVCD with LAD Anterolateral infarct,  age indeterminate Baseline wander in lead(s) I II aVR V5 Since last  tracing rate faster Confirmed by Theadore Blunck  MD-J, Emilyrose Darrah ([id number]) on 07/04/2014  4:55:29 PM     Medications  albuterol (PROVENTIL,VENTOLIN) solution continuous neb (10 mg/hr Nebulization Given 07/04/14 1656)  methylPREDNISolone sodium succinate (SOLU-MEDROL) 125 mg/2 mL injection 125 mg (125 mg Intravenous Given 07/04/14 1651)  ipratropium (ATROVENT) nebulizer solution 0.5 mg (0.5 mg Nebulization Given 07/04/14 1656)     MDM   Final diagnoses:  COPD exacerbation    Patient improved slightly with treatment in the emergency department. After an hour-long nebulizer treatment and IV steroids she is feeling  somewhat better. Sam she still has diminished breath sounds and some wheezing bilaterally. Considering her severe COPD I will consult the medical service regarding admission for further treatment.    Dorie Rank, MD  07/04/14 1833 

## 2014-07-04 NOTE — H&P (Signed)
Date: 07/04/2014               Patient Name:  Shawna [name] MRN: [id number]  DOB: June 19, 1945 Age / Sex: 69 y.o., female   PCP: [name], DO              Medical Service: Internal Medicine Teaching Service              Attending Physician: Dr. Annia Belt, MD    First Contact: Dr. Genene Churn Pager: [phone]  Second Contact: Dr. Naaman Plummer Pager: [phone]            After Hours (After 5p/  First Contact Pager: [phone]  weekends / holidays): Second Contact Pager: [phone]   Chief Complaint:  Trouble breathing.  History of Present Illness: Shawna [name] is a 69 year old woman with history of COPD-GOLD stage IV, left lower lobe pulmonary nodule, HTN, osteoarthritis, and GERD presenting with shortness of breath.  The patient has severe COPD, and she is chronically on home oxygen. She reports worsening shortness of breath over the past week associated with wheezing unresponsive to her home nebulizers. She reports coughing up yellow/green sputum that is occasionally brown in color. At baseline, she does not have sputum production. She reports 2 episodes of vomiting when looking at her sputum. Per the patient, she has a very weak stomach.  Her dyspnea is worse with minimal activity. She denies any chest pain, fever, congestion, or sore throat. She reports having at least 4 hospitalizations for her COPD over the last year. A couple weeks ago, her pulmonologist called in a prescription for Levaquin, which she completed. She has also been taking prednisone 10 mg daily over the last month, since her last appointment with her pulmonologist.  She reports increasing lower extremity swelling over the last few weeks, but she reports that it is improved today.  She reports having intermittent palpitations. She reportedly saw her primary care doctor and reportedly was told she has intermittent atrial fibrillation.  She is scheduled to see her pulmonary doctor tomorrow, and she was trying to make it  to that appointment. However, her symptoms continued to progress, and she called EMS.  In the ER, she was given IV steroids and nebulizers with some improvement, but she continued to have decreased air movement and wheezing bilaterally.  Review of Systems: Review of Systems  Constitutional: Positive for malaise/fatigue and diaphoresis. Negative for fever and chills.  HENT: Negative for congestion and sore throat.   Respiratory: Positive for cough, sputum production, shortness of breath and wheezing. Negative for hemoptysis.   Cardiovascular: Positive for chest pain (with coughing), palpitations and leg swelling.  Gastrointestinal: Positive for heartburn, nausea and vomiting. Negative for abdominal pain, diarrhea and constipation.  Genitourinary: Negative for dysuria and urgency.       Urge incontinence, discussing with pulmonary doctor.  Musculoskeletal: Negative for myalgias and back pain.  Skin: Negative for rash.  Neurological: Positive for weakness. Negative for dizziness, sensory change, focal weakness and headaches.    Meds:  (Not in a hospital admission) No current facility-administered medications for this encounter.   Current Outpatient Prescriptions  Medication Sig Dispense Refill  . acetaminophen (TYLENOL) 500 MG tablet Take 500 mg by mouth every 6 (six) hours as needed for moderate pain.     Marland Kitchen albuterol (PROVENTIL) (2.5 MG/3ML) 0.083% nebulizer solution Take 2.5 mg by nebulization 4 (four) times daily. For shortness of breath    . Albuterol Sulfate (PROAIR RESPICLICK) 762 (90  BASE) MCG/ACT AEPB Inhale 2 puffs into the lungs every 4 (four) hours as needed. (Patient taking differently: Inhale 2 puffs into the lungs every 4 (four) hours as needed (for wheezing and shortness of breath). ) 1 each 5  . ALPRAZolam (XANAX) 0.25 MG tablet Take 0.25 mg by mouth 2 (two) times daily.    . carvedilol (COREG) 3.125 MG tablet Take 3.125 mg by mouth daily.    . Dentifrices (BIOTENE DRY  MOUTH) GEL Place 1 application onto teeth 2 (two) times daily as needed (dry mouth).    . esomeprazole (NEXIUM) 20 MG capsule Take 20 mg by mouth daily at 12 noon.    . fluticasone (FLONASE) 50 MCG/ACT nasal spray Place 2 sprays into both nostrils as needed for allergies.     . furosemide (LASIX) 20 MG tablet TAKE ONE TO TWO TABLETS BY MOUTH ONCE DAILY AS NEEDED FOR EDEMA OR  FLUID (Patient taking differently: TAKE ONE TaBLET BY MOUTH twice DAILY AS NEEDED FOR EDEMA OR  FLUID) 30 tablet 0  . guaiFENesin (MUCINEX) 600 MG 12 hr tablet Take 1 tablet (600 mg total) by mouth 2 (two) times daily as needed. (Patient taking differently: Take 600 mg by mouth 2 (two) times daily. )    . ipratropium (ATROVENT) 0.02 % nebulizer solution Take 2.5 mLs (0.5 mg total) by nebulization 4 (four) times daily. Dx 496 360 mL 3  . KLOR-CON M20 20 MEQ tablet Take 20 mEq by mouth daily.     Marland Kitchen loratadine (CLARITIN) 10 MG tablet Take 1 tablet (10 mg total) by mouth daily. 30 tablet 0  . meloxicam (MOBIC) 15 MG tablet Take 15 mg by mouth daily.     Marland Kitchen morphine 10 MG/5ML solution Take 2.5 mLs (5 mg total) by mouth every 4 (four) hours as needed (shortness of breath). 500 mL 0  . polyethylene glycol (MIRALAX / GLYCOLAX) packet Take 17 g by mouth every other day.    . predniSONE (DELTASONE) 10 MG tablet Take 1 tablet (10 mg total) by mouth daily with breakfast. Until seen in the office on 06/02/14 (Patient taking differently: Take 10 mg by mouth daily with breakfast. Until seen in the office on 07/05/14) 30 tablet 1  . Tiotropium Bromide-Olodaterol (STIOLTO RESPIMAT) 2.5-2.5 MCG/ACT AERS Inhale 2.5 mcg into the lungs daily. Inhale 2 puffs once daily 1 Inhaler 4  . verapamil (CALAN-SR) 180 MG CR tablet Take 180 mg by mouth at bedtime.     . cyanocobalamin (,VITAMIN B-12,) 1000 MCG/ML injection Inject 1,000 mcg into the muscle every 30 (thirty) days. Given on the 10th of each month    . levofloxacin (LEVAQUIN) 500 MG tablet Take 1  tablet (500 mg total) by mouth daily. (Patient not taking: Reported on 07/04/2014) 7 tablet 0  . predniSONE (DELTASONE) 10 MG tablet 2 tablets with food for 7 days, then 1 tablet daily (Patient not taking: Reported on 07/04/2014) 60 tablet 1    Allergies: Allergies as of 07/04/2014 - Review Complete 07/04/2014  Allergen Reaction Noted  . Aspirin Swelling 01/19/2013  . Percodan [oxycodone-aspirin] Other (See Comments) 02/02/2012  . Advair diskus [fluticasone-salmeterol] Other (See Comments) 07/04/2014   Past Medical History  Diagnosis Date  . COPD (chronic obstructive pulmonary disease)   . HTN (hypertension)   . B12 deficiency   . OA (osteoarthritis)   . Hypokalemia   . Mass of lung   . GERD (gastroesophageal reflux disease)   . Diverticulosis of colon (without mention of hemorrhage)   .  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 Pulmonary nodule    Past Surgical History  Procedure Laterality Date  . Carpal tunnel release      bilateral  . Tubal ligation      x 2  . Cervical spine surgery      plate with screws  . Lung surgery Right 07/06/11    RUL, per patient Dr Roxan Hockey  . Video bronchoscopy Bilateral 01/20/2013    Procedure: VIDEO BRONCHOSCOPY WITH FLUORO;  Surgeon: Rigoberto Noel, MD;  Location: [facility];  Service: Cardiopulmonary;  Laterality: Bilateral;  . Cataract extraction w/phaco Left 11/30/2013    Procedure: CATARACT EXTRACTION PHACO AND INTRAOCULAR LENS PLACEMENT (IOC);  Surgeon: Tonny Branch, MD;  Location: [facility];  Service: Ophthalmology;  Laterality: Left;  CDE:  11.35   Family History  Problem Relation Age of Onset  . Coronary artery disease Mother 64  . Alzheimer's disease Mother   . Colon cancer    . COPD Father    History   Social History  . Marital Status: Widowed    Spouse Name: N/A  . Number of Children: 3  . Years of Education: N/A   Occupational History  . RETIRED    Social History Main Topics  . Smoking status: Former Smoker  -- 1.00 packs/day for 40 years    Types: Cigarettes    Quit date: 06/30/2011  . Smokeless tobacco: Never Used     Comment: resumed for a short time  . Alcohol Use: No  . Drug Use: No  . Sexual Activity: No   Other Topics Concern  . Not on file   Social History Narrative   Lives alone.   Daily caffeine     Physical Exam: Filed Vitals:   07/04/14 1930  BP: 110/64  Pulse: 110  Temp: 98.7  Resp: 19   Physical Exam  Constitutional: She is oriented to person, place, and time and well-developed, well-nourished, and in no distress.  HENT:  Head: Normocephalic and atraumatic.  Mouth/Throat: No oropharyngeal exudate.  Eyes: Conjunctivae and EOM are normal. Pupils are equal, round, and reactive to light. No scleral icterus.  Cardiovascular: Regular rhythm and intact distal pulses.  Exam reveals no gallop and no friction rub.   No murmur heard. Tachycardic.  Pulmonary/Chest: She is in respiratory distress (mild). She has wheezes (mild expiratory). She has no rales.  Decreased air movement.  Abdominal: Soft. Bowel sounds are normal. She exhibits no distension. There is no tenderness.  Musculoskeletal: Normal range of motion. She exhibits no edema or tenderness.  Wearing compression stockings.  Neurological: She is alert and oriented to person, place, and time. No cranial nerve deficit. She exhibits normal muscle tone.  Skin: Skin is warm and dry. No rash noted. She is not diaphoretic. No erythema.    Lab results: Basic Metabolic Panel:  Recent Labs  07/04/14 1648  NA 138  K 4.1  CL 93*  CO2 33*  GLUCOSE 81  BUN 21  CREATININE 1.05  CALCIUM 9.0   CBC:  Recent Labs  07/04/14 1648  WBC 8.0  HGB 14.8  HCT 46.0  MCV 95.4  PLT 102*   Troponin (Point of Care Test)  Recent Labs  07/04/14 1654  TROPIPOC 0.01   BNP    Component Value Date/Time   BNP 178.1* 07/04/2014 1648    ProBNP    Component Value Date/Time   PROBNP 348.4* 06/17/2013 0711    Imaging  results:  Dg Chest [facility]  1 View  07/04/2014   CLINICAL DATA:  Respiratory distress for 1 week.  EXAM: PORTABLE CHEST - 1 VIEW  COMPARISON:  Chest radiograph 03/15/2014  FINDINGS: Monitoring leads overlie the patient. Normal cardiac and mediastinal contours. Stable postsurgical changes involving the right hemi thorax. No large consolidative pulmonary opacities. No pleural effusion or pneumothorax. AC joint degenerative changes.  IMPRESSION: Stable postoperative changes involving the right hemi thorax.  No acute cardiopulmonary process.   Electronically Signed   By: Lovey Newcomer M.D.   On: 07/04/2014 17:08    Other results: EKG: Sinus tachycardia with right atrial enlargement, prolonged QTC of 535, poor R-wave progression in anterior leads.  Assessment & Plan by Problem: Active Problems:   COPD with acute exacerbation   #COPD exacerbation She follows with Dr. Elsworth Soho of pulmonology. FEV1 37% in May 2015. Last appointment in March, her Advair and Spiriva were stopped, and she was given a trial of Stiolto (tiotropium/olodaterol). She is prescribed morphine for her chronic respiratory failure. At her last appointment, she was told to increase this to 5 mg twice a day. In the computer and per the patient, she has been taking this every 4 hours as needed.  She also takes Xanax 0.25 mg twice a day for her respiratory distress. No evidence of pneumonia on chest x-ray. EKG nonspecific and troponin negative. She does have increased sputum production.  She has risk factors for pseudomonas with frequent hospitalizations and courses of antibiotics along with severe COPD. She received Solu-Medrol 125 mg in the ER. -DuoNeb's every 6 hours. -Continue home prednisone 10 mg daily. Additional steroids per day team. -Cefepime per pharmacy. -Continue home Stiolto daily. -Mucinex twice a day when necessary. -Continue home Xanax 0.25 mg twice a day. -Continue home morphine 5 mg twice a day. Day team to address dosing  tomorrow. -Continue home MiraLAX 17 g every other day. -Repeat CBC and CMP in the morning. -Consult PT and OT. -Regular diet. -Patient is DO NOT RESUSCITATE, brought form to hospital.  #Left lower lobe nodule Previously had right upper lobectomy in April 2013 for benign lesion. Irregular left lower lobe nodule was noted in 2013, and has been followed by serial CT scans. It was found to be PET positive in October 2014. She has been seen by oncology and radiation oncology, plan to follow the nodule serially. Plan for next CT in June. -Follow-up with pulmonology as outpatient.  #Reported atrial fibrillation In sinus on EKG and on exam. processes why she is on verapamil?  -Monitor rhythm on telemetry. -Repeat EKG in the morning.  #Hypokalemia On K Dur 20 mEq daily. -Check CMP tomorrow.  #Hypertension Currently normotensive. -Continue home carvedilol 3.125 mg daily and verapamil 180 mg daily.  #Lower extremity edema Patient takes Lasix as needed for edema. Echocardiogram from November 2013 showed normal ejection fraction without diastolic dysfunction. Edema most likely secondary to pulmonary hypertension from COPD. No evidence of edema on exam currently. -Hold home Lasix.  #Osteoarthritis -Continue home Tylenol 500 mg every 6 hours as needed. -Continue home meloxicam 15 mg daily.  #Allergies -Continue home Claritin 10 mg daily.  #Dry mouth -Continue home Biotene twice a day when necessary.  #GERD -Continue home PPI.  Dispo: Disposition is deferred at this time, awaiting improvement of current medical problems. Anticipated discharge in approximately 1-3 day(s).   The patient does have a current PCP [name], DO), therefore will not be require OPC follow-up after discharge.   The patient does have transportation limitations that hinder  transportation to clinic appointments.   Signed:  Arman Filter, MD, PhD PGY-1 Internal Medicine Teaching Service Pager:  [phone] 07/04/2014, 8:07 PM

## 2014-07-04 NOTE — ED Notes (Signed)
Per ems, pt reports resp distress x 1 week.  Pt arrives awake, alert, oriented, reports shortness of breath, increased oxygen needs.  Pt arrives on 5l [place]

## 2014-07-05 ENCOUNTER — Ambulatory Visit: Payer: Medicare Other | Admitting: Adult Health

## 2014-07-05 DIAGNOSIS — E876 Hypokalemia: Secondary | ICD-10-CM

## 2014-07-05 DIAGNOSIS — R600 Localized edema: Secondary | ICD-10-CM

## 2014-07-05 DIAGNOSIS — R911 Solitary pulmonary nodule: Secondary | ICD-10-CM

## 2014-07-05 DIAGNOSIS — R682 Dry mouth, unspecified: Secondary | ICD-10-CM

## 2014-07-05 DIAGNOSIS — T7840XA Allergy, unspecified, initial encounter: Secondary | ICD-10-CM

## 2014-07-05 DIAGNOSIS — K219 Gastro-esophageal reflux disease without esophagitis: Secondary | ICD-10-CM

## 2014-07-05 DIAGNOSIS — I4891 Unspecified atrial fibrillation: Secondary | ICD-10-CM

## 2014-07-05 DIAGNOSIS — I10 Essential (primary) hypertension: Secondary | ICD-10-CM

## 2014-07-05 DIAGNOSIS — J441 Chronic obstructive pulmonary disease with (acute) exacerbation: Principal | ICD-10-CM

## 2014-07-05 DIAGNOSIS — M1990 Unspecified osteoarthritis, unspecified site: Secondary | ICD-10-CM

## 2014-07-05 LAB — COMPREHENSIVE METABOLIC PANEL
ALK PHOS: 56 U/L (ref 39–117)
ALT: 14 U/L (ref 0–35)
AST: 22 U/L (ref 0–37)
Albumin: 2.9 g/dL — ABNORMAL LOW (ref 3.5–5.2)
Anion gap: 11 (ref 5–15)
BILIRUBIN TOTAL: 0.6 mg/dL (ref 0.3–1.2)
BUN: 22 mg/dL (ref 6–23)
CHLORIDE: 94 mmol/L — AB (ref 96–112)
CO2: 34 mmol/L — AB (ref 19–32)
Calcium: 8.8 mg/dL (ref 8.4–10.5)
Creatinine, Ser: 1.08 mg/dL (ref 0.50–1.10)
GFR calc non Af Amer: 52 mL/min — ABNORMAL LOW (ref >90)
GFR, EST AFRICAN AMERICAN: 60 mL/min — AB (ref >90)
GLUCOSE: 149 mg/dL — AB (ref 70–99)
Potassium: 4.1 mmol/L (ref 3.5–5.1)
SODIUM: 139 mmol/L (ref 135–145)
TOTAL PROTEIN: 5.8 g/dL — AB (ref 6.0–8.3)

## 2014-07-05 LAB — CBC
HCT: 41.5 % (ref 36.0–46.0)
HEMOGLOBIN: 13.1 g/dL (ref 12.0–15.0)
MCH: 30.2 pg (ref 26.0–34.0)
MCHC: 31.6 g/dL (ref 30.0–36.0)
MCV: 95.6 fL (ref 78.0–100.0)
PLATELETS: 92 10*3/uL — AB (ref 150–400)
RBC: 4.34 MIL/uL (ref 3.87–5.11)
RDW: 14.6 % (ref 11.5–15.5)
WBC: 5.8 10*3/uL (ref 4.0–10.5)

## 2014-07-05 LAB — TROPONIN I: Troponin I: 0.05 ng/mL — ABNORMAL HIGH (ref <0.031)

## 2014-07-05 LAB — MRSA PCR SCREENING: MRSA by PCR: NEGATIVE

## 2014-07-05 MED ORDER — IPRATROPIUM-ALBUTEROL 0.5-2.5 (3) MG/3ML IN SOLN
3.0000 mL | Freq: Four times a day (QID) | RESPIRATORY_TRACT | Status: DC
  Administered 2014-07-05 – 2014-07-06 (×4): 3 mL via RESPIRATORY_TRACT
  Filled 2014-07-05 (×4): qty 3

## 2014-07-05 MED ORDER — BENZONATATE 100 MG PO CAPS: 100.0000 mg | ORAL_CAPSULE | Freq: Three times a day (TID) | ORAL | Status: DC

## 2014-07-05 MED ORDER — DOXYCYCLINE HYCLATE 100 MG PO TABS
100.0000 mg | ORAL_TABLET | Freq: Two times a day (BID) | ORAL | Status: DC
  Administered 2014-07-05 – 2014-07-06 (×2): 100 mg via ORAL
  Filled 2014-07-05 (×3): qty 1

## 2014-07-05 MED ORDER — ASPIRIN 81 MG PO CHEW
81.0000 mg | CHEWABLE_TABLET | Freq: Every day | ORAL | Status: DC
  Administered 2014-07-05 – 2014-07-06 (×2): 81 mg via ORAL
  Filled 2014-07-05 (×3): qty 1

## 2014-07-05 MED ORDER — BENZONATATE 100 MG PO CAPS
100.0000 mg | ORAL_CAPSULE | Freq: Two times a day (BID) | ORAL | Status: DC
Start: 2014-07-05 — End: 2014-07-05

## 2014-07-05 MED ORDER — BENZONATATE 100 MG PO CAPS
100.0000 mg | ORAL_CAPSULE | Freq: Two times a day (BID) | ORAL | Status: DC
  Administered 2014-07-05 – 2014-07-06 (×2): 100 mg via ORAL
  Filled 2014-07-05 (×3): qty 1

## 2014-07-05 MED ORDER — METHYLPREDNISOLONE SODIUM SUCC 125 MG IJ SOLR
60.0000 mg | Freq: Four times a day (QID) | INTRAMUSCULAR | Status: DC
  Administered 2014-07-05 – 2014-07-06 (×3): 60 mg via INTRAVENOUS
  Filled 2014-07-05 (×2): qty 0.96
  Filled 2014-07-05: qty 2
  Filled 2014-07-05 (×2): qty 0.96
  Filled 2014-07-05 (×2): qty 2

## 2014-07-05 NOTE — Progress Notes (Signed)
Pt c/o of SOB after getting back to bed from the bedside commode.  02 91% 3L.  Requesting ventolin tx.  No order seen.  Paged on call MD and and 2nd numbers paged.  On call nurse made aware.  Tamilyn Lupien,RN.

## 2014-07-05 NOTE — Evaluation (Signed)
Physical Therapy Evaluation Patient Details Name: Shawna [name] MRN: [id number] DOB: Jan 24, 1946 Today's Date: 07/05/2014   History of Present Illness  Pt admitted with COPD exacerbation.  Clinical Impression  Pt admitted with above diagnosis. Pt currently with functional limitations due to the deficits listed below (see PT Problem List). Pt will benefit from skilled PT to increase their independence and safety with mobility to allow discharge to the venue listed below.  O2 90% on 2 L/min with 60' of gait with dyspnea and no AD.     Follow Up Recommendations No PT follow up    Equipment Recommendations  None recommended by PT    Recommendations for Other Services       Precautions / Restrictions Precautions Precaution Comments: o2 dependent      Mobility  Bed Mobility Overal bed mobility: Modified Independent                Transfers Overall transfer level: Needs assistance Equipment used: None Transfers: Sit to/from Stand Sit to Stand: Supervision            Ambulation/Gait Ambulation/Gait assistance: Min guard Ambulation Distance (Feet): 60 Feet Assistive device: None Gait Pattern/deviations: Step-through pattern Gait velocity: decreased   General Gait Details: Amb with step through pattern with min/guard for steadying. Amb on 2 L/min with o2 90%  Stairs            Wheelchair Mobility    Modified Rankin (Stroke Patients Only)       Balance Overall balance assessment: Needs assistance Sitting-balance support: No upper extremity supported Sitting balance-Leahy Scale: Good     Standing balance support: No upper extremity supported Standing balance-Leahy Scale: Fair                               Pertinent Vitals/Pain Pain Assessment: No/denies pain  90% on 2 L/min with gait.    Home Living Family/patient expects to be discharged to:: Private residence Living Arrangements: Alone Available Help at Discharge: Available  PRN/intermittently Type of Home: House       Home Layout: One level Home Equipment: Other (comment);Cane - single point Additional Comments: oxygen    Prior Function Level of Independence: Independent         Comments: Has A for housekeeping     Hand Dominance        Extremity/Trunk Assessment   Upper Extremity Assessment: Defer to OT evaluation           Lower Extremity Assessment: Overall WFL for tasks assessed      Cervical / Trunk Assessment: Normal  Communication   Communication: No difficulties  Cognition Arousal/Alertness: Awake/alert Behavior During Therapy: WFL for tasks assessed/performed Overall Cognitive Status: Within Functional Limits for tasks assessed                      General Comments General comments (skin integrity, edema, etc.): Pt very sleepy during session.    Exercises        Assessment/Plan    PT Assessment Patient needs continued PT services  PT Diagnosis Difficulty walking   PT Problem List Decreased activity tolerance;Decreased balance;Decreased mobility;Cardiopulmonary status limiting activity  PT Treatment Interventions Gait training;Functional mobility training;Therapeutic activities;Balance training   PT Goals (Current goals can be found in the Care Plan section) Acute Rehab PT Goals Patient Stated Goal: To go back to sleep PT Goal Formulation: With patient Time For Goal Achievement: 07/19/14  Potential to Achieve Goals: Good    Frequency Min 3X/week   Barriers to discharge        Co-evaluation               End of Session Equipment Utilized During Treatment: Oxygen Activity Tolerance: Patient limited by fatigue Patient left: in bed;with call bell/phone within reach;Other (comment) (with respiratory therapy) Nurse Communication: Other (comment) (nursing leaving room upon PT's entry)         Time: 8485-9276 PT Time Calculation (min) (ACUTE ONLY): 15 min   Charges:   PT  Evaluation $Initial PT Evaluation Tier I: 1 Procedure     PT G Codes:        Cacie Gaskins LUBECK 07/05/2014, 12:57 PM

## 2014-07-05 NOTE — Care Management Note (Unsigned)
    Page 1 of 1   07/05/2014     2:16:17 PM CARE MANAGEMENT NOTE 07/05/2014  Patient:  Shawna [name], Shawna [name]   Account Number:  [id number]  Date Initiated:  07/05/2014  Documentation initiated by:  Maddox Hlavaty  Subjective/Objective Assessment:   Pt adm on 07/04/14 with COPD exacerbation.  PTA, pt resides at home alone.  She is on chronic home oxygen.     Action/Plan:   Will follow for home needs as pt progresses.  PT recommending no OP follow up.  Would recommend [facility] for disease mgmt of COPD.   Anticipated DC Date:  07/08/2014   Anticipated DC Plan:  [place]  CM consult      Choice offered to / List presented to:             Status of service:  In process, will continue to follow Medicare Important Message given?   (If response is "NO", the following Medicare IM given date fields will be blank) Date Medicare IM given:   Medicare IM given by:   Date Additional Medicare IM given:   Additional Medicare IM given by:    Discharge Disposition:    Per UR Regulation:  Reviewed for med. necessity/level of care/duration of stay  If discussed at [place] of Stay Meetings, dates discussed:    Comments:

## 2014-07-05 NOTE — Progress Notes (Signed)
Subjective: Continues to feel sob. Not sure if SOB is much different from her normal baseline but does feel little better after nebulizer treatments. Has been using morphine as needed for SOB at home. Patient's daughter also give [place] hx of heart rate being slow and also fast in the past, which was thought to be from her lung disease.   Not sure what caused her worsening, but does think pollen may have played a role. Asked about aspirin allergy, she said she had taken it without problem. Denies any chest pain, n/v/ fever, chills.    Objective: Vital signs in last 24 hours: Filed Vitals:   07/05/14 1404 07/05/14 1518 07/05/14 2031 07/05/14 2050  BP: 123/83   122/80  Pulse: 82   93  Temp: 98.2 F (36.8 C)   98 F (36.7 C)  TempSrc: Oral   Oral  Resp: 18     Height:      Weight:      SpO2: 94% 96% 99% 98%   Weight change:   Intake/Output Summary (Last 24 hours) at 07/05/14 2309 Last data filed at 07/05/14 1742  Gross per 24 hour  Intake    840 ml  Output   1001 ml  Net   -161 ml   Vitals reviewed. General: resting in bed, NAD HEENT: PERRL, EOMI, no scleral icterus Cardiac: RRR, no rubs, murmurs or gallops Pulm: decreased Breathe sound on RUL, s/p RUL pneumonectomy, hyperresonant, no wheezing currently. Has good air movement. Abd: soft, nontender, nondistended, BS present Ext: warm and well perfused, no pedal edema Neuro: alert and oriented X3, cranial nerves II-XII grossly intact, strength and sensation to light touch equal in bilateral upper and lower extremities  Lab Results: Basic Metabolic Panel:  Recent Labs Lab 07/04/14 1648 07/05/14 0410  NA 138 139  K 4.1 4.1  CL 93* 94*  CO2 33* 34*  GLUCOSE 81 149*  BUN 21 22  CREATININE 1.05 1.08  CALCIUM 9.0 8.8   Liver Function Tests:  Recent Labs Lab 07/05/14 0410  AST 22  ALT 14  ALKPHOS 56  BILITOT 0.6  PROT 5.8*  ALBUMIN 2.9*   No results for input(s): LIPASE, AMYLASE in the last 168 hours. No  results for input(s): AMMONIA in the last 168 hours. CBC:  Recent Labs Lab 07/04/14 1648 07/05/14 0410  WBC 8.0 5.8  HGB 14.8 13.1  HCT 46.0 41.5  MCV 95.4 95.6  PLT 102* 92*   Cardiac Enzymes:  Recent Labs Lab 07/05/14 0410  TROPONINI 0.05*   BNP: No results for input(s): PROBNP in the last 168 hours. Misc. Labs:  Micro Results: Recent Results (from the past 240 hour(s))  MRSA PCR Screening     Status: None   Collection Time: 07/05/14  8:00 PM  Result Value Ref Range Status   MRSA by PCR NEGATIVE NEGATIVE Final    Comment:        The GeneXpert MRSA Assay (FDA approved for NASAL specimens only), is one component of a comprehensive MRSA colonization surveillance program. It is not intended to diagnose MRSA infection nor to guide or monitor treatment for MRSA infections.    Studies/Results: Dg Chest Port 1 View  07/04/2014   CLINICAL DATA:  Respiratory distress for 1 week.  EXAM: PORTABLE CHEST - 1 VIEW  COMPARISON:  Chest radiograph 03/15/2014  FINDINGS: Monitoring leads overlie the patient. Normal cardiac and mediastinal contours. Stable postsurgical changes involving the right hemi thorax. No large consolidative pulmonary opacities. No pleural  effusion or pneumothorax. AC joint degenerative changes.  IMPRESSION: Stable postoperative changes involving the right hemi thorax.  No acute cardiopulmonary process.   Electronically Signed   By: Lovey Newcomer M.D.   On: 07/04/2014 17:08   Medications: I have reviewed the patient's current medications. Scheduled Meds: . ALPRAZolam  0.25 mg Oral BID  . aspirin  81 mg Oral Daily  . benzonatate  100 mg Oral BID  . carvedilol  3.125 mg Oral Daily  . doxycycline  100 mg Oral Q12H  . ipratropium-albuterol  3 mL Nebulization Q6H  . loratadine  10 mg Oral Daily  . meloxicam  15 mg Oral Daily  . methylPREDNISolone (SOLU-MEDROL) injection  60 mg Intravenous Q6H  . pantoprazole  40 mg Oral Daily  . [START ON 07/06/2014]  polyethylene glycol  17 g Oral QODAY  . sodium chloride  3 mL Intravenous Q12H  . Tiotropium Bromide-Olodaterol  2.5 mcg Inhalation Daily  . verapamil  180 mg Oral QHS   Continuous Infusions:  PRN Meds:.acetaminophen, antiseptic oral rinse, guaiFENesin, morphine CONCENTRATE Assessment/Plan: Active Problems:   COPD exacerbation   COPD with acute exacerbation   Thrombocytopenia  69 yo female with COPD gold stage 4, HTN, here with copd exacerbation  AEoCOPD - unclear etiology, likely pollen could have played a role. Has gold stage 4 COPD, managed by Dr. Elsworth Soho pulm. She has been put on morphine PRN for her dyspnea by pulm. -patient has reached the maximum in terms of treatments of her end stage COPD. She remains very dyspneic at baseline with some improvement with nebulizers. She has been using morphine for relief, which sounds more of a palliative measure.  - at home, on prednisone 10mg  daily, albuterol, proair, atrovent, tiotropium bromide-olodaterol, flonase, zyrtec.Marland Kitchen Also recently finished a course of levaquin. On mucinex and   - SOB somewhat improved with current higher dose of steroidal treatment. - cont COPD exct treatment with Doxycycline (was on cefepime initially), cont solumedrol 60mg  q6hr (received 125mg  once initially). - cont morphine 5mg  BID PRN for semi-palliative measures for her end stage copd. - DNR  Reported afib, hx seems more like tachy-brady syndrome - repeated EKG shows NSR with PVCs. HR was as low as 48 on tele.  - continue monitoring on tele - started asa 81mg .   HTN -normotensive now @ home on coreg 3.125mg  + verapamil 180mg  daily - cont these.    LLL pulm nodule - 1.6x10 cm LLL nodule. Stable compared to CT 03/05/2014. - has been followed by ong and radonc outpatient with serial CT's. Plan is CT in June again. F/up outpatient team after discharge  - also s/p RUP lobectomy April 2013 which was a benign nodule.  Lower ext edema - uses lasix PRN.  - [facility]  2013 - normal EF and normal diastolic dysfunction. - euvolemic currently. Hold for now, can continue using PRN.   GERD - cont ppi  OA - cont home tylenol + meloxicam.  Prolonged QTC - avoid QT prolonging drugs.  DNR  Diet: regular.   Dispo: Disposition is deferred at this time, awaiting improvement of current medical problems.  Anticipated discharge in approximately 1-2 day(s).   The patient does have a current PCP [name], DO) and does need an [facility] hospital follow-up appointment after discharge.  The patient does have transportation limitations that hinder transportation to clinic appointments.  .Services Needed at time of discharge: Y = Yes, Blank = No PT:   OT:   RN:   Equipment:  Other:     LOS: 1 day   Dellia Nims, MD 07/05/2014, 11:09 PM

## 2014-07-05 NOTE — Evaluation (Signed)
Occupational Therapy Evaluation Patient Details Name: Shawna [name] MRN: [id number] DOB: [birth year] Today's Date: 07/05/2014    History of Present Illness Pt admitted with COPD exacerbation.   Clinical Impression   Pt is performing ADL at a set up to supervision level. She fatigues easily. Educated pt in energy conservation strategies.  No further OT needs.    Follow Up Recommendations  No OT follow up;Supervision - Intermittent    Equipment Recommendations  None recommended by OT    Recommendations for Other Services       Precautions / Restrictions Precautions Precaution Comments: 02 dependent at baseline Restrictions Weight Bearing Restrictions: No      Mobility Bed Mobility Overal bed mobility: Modified Independent                Transfers Overall transfer level: Needs assistance Equipment used: None Transfers: Sit to/from Stand Sit to Stand: Supervision              Balance Overall balance assessment: Needs assistance Sitting-balance support: No upper extremity supported Sitting balance-Leahy Scale: Good     Standing balance support: No upper extremity supported Standing balance-Leahy Scale: Fair                              ADL Overall ADL's : Needs assistance/impaired Eating/Feeding: Independent;Sitting   Grooming: Wash/dry hands;Standing;Supervision/safety   Upper Body Bathing: Set up;Sitting   Lower Body Bathing: Supervison/ safety;Sit to/from stand   Upper Body Dressing : Set up;Sitting   Lower Body Dressing: Supervision/safety;Sit to/from stand   Toilet Transfer: Supervision/safety;Ambulation   Toileting- Clothing Manipulation and Hygiene: Supervision/safety;Sit to/from stand       Functional mobility during ADLs: Supervision/safety (used furniture to steady) General ADL Comments: Instructed pt in energy conservation strategies.  Pt utilizes purse lip breathing techniques independently.     Vision      Perception     Praxis      Pertinent Vitals/Pain Pain Assessment: No/denies pain     Hand Dominance Right   Extremity/Trunk Assessment Upper Extremity Assessment Upper Extremity Assessment: Overall WFL for tasks assessed   Lower Extremity Assessment Lower Extremity Assessment: Defer to PT evaluation   Cervical / Trunk Assessment Cervical / Trunk Assessment: Normal   Communication Communication Communication: No difficulties   Cognition Arousal/Alertness: Awake/alert Behavior During Therapy: WFL for tasks assessed/performed Overall Cognitive Status: Within Functional Limits for tasks assessed                     General Comments       Exercises       Shoulder Instructions      Home Living Family/patient expects to be discharged to:: Private residence Living Arrangements: Alone Available Help at Discharge: Available PRN/intermittently Type of Home: House       Home Layout: One level     Bathroom Shower/Tub: Teacher, early years/pre: Standard     Home Equipment: Cane - single point;Shower seat (02)   Additional Comments: oxygen      Prior Functioning/Environment Level of Independence: Needs assistance    ADL's / Homemaking Assistance Needed: assist of neighbor for housekeeping, family gets pt's groceries, takes her to MD   Comments: pt states she drives when she doesn't take morphine    OT Diagnosis:     OT Problem List:     OT Treatment/Interventions:      OT Goals(Current goals can be found in  the care plan section) Acute Rehab OT Goals Patient Stated Goal: be able to take care of her home  OT Frequency:     Barriers to D/C:            Co-evaluation              End of Session    Activity Tolerance: Patient limited by fatigue Patient left: in bed;with call bell/phone within reach   Time: 1400-1425 OT Time Calculation (min): 25 min Charges:  OT General Charges $OT Visit: 1 Procedure OT  Evaluation $Initial OT Evaluation Tier I: 1 Procedure OT Treatments $Self Care/Home Management : 8-22 mins G-Codes:    Malka So 07/05/2014, 2:53 PM [phone]

## 2014-07-06 ENCOUNTER — Telehealth: Payer: Self-pay | Admitting: Pulmonary Disease

## 2014-07-06 DIAGNOSIS — I129 Hypertensive chronic kidney disease with stage 1 through stage 4 chronic kidney disease, or unspecified chronic kidney disease: Secondary | ICD-10-CM

## 2014-07-06 DIAGNOSIS — D696 Thrombocytopenia, unspecified: Secondary | ICD-10-CM

## 2014-07-06 DIAGNOSIS — I4581 Long QT syndrome: Secondary | ICD-10-CM

## 2014-07-06 DIAGNOSIS — N184 Chronic kidney disease, stage 4 (severe): Secondary | ICD-10-CM

## 2014-07-06 LAB — TROPONIN I: Troponin I: <0.03 ng/mL (ref <0.031)

## 2014-07-06 LAB — HIV ANTIBODY (ROUTINE TESTING W REFLEX): HIV Screen 4th Generation wRfx: NONREACTIVE

## 2014-07-06 LAB — TECHNOLOGIST SMEAR REVIEW

## 2014-07-06 MED ORDER — PREDNISONE 20 MG PO TABS: 60.0000 mg | ORAL_TABLET | Freq: Every day | ORAL | Status: DC

## 2014-07-06 MED ORDER — DOXYCYCLINE HYCLATE 100 MG PO TABS: 100.0000 mg | ORAL_TABLET | Freq: Two times a day (BID) | ORAL | Status: DC

## 2014-07-06 MED ORDER — ALBUTEROL SULFATE (2.5 MG/3ML) 0.083% IN NEBU: 5.0000 mg | INHALATION_SOLUTION | RESPIRATORY_TRACT | Status: DC | PRN

## 2014-07-06 MED ORDER — ASPIRIN 81 MG PO CHEW: 81.0000 mg | CHEWABLE_TABLET | Freq: Every day | ORAL | Status: DC

## 2014-07-06 NOTE — Discharge Instructions (Signed)
You should follow up with Dr. Bari Mantis office closely. Discuss whether you should continue your prednisone '60mg'$  daily with him, but do take it until you see him.  Take doxycycline for 2 more days, '100mg'$  twice a day, including a dose tonight.

## 2014-07-06 NOTE — Discharge Summary (Signed)
Name: Shawna [name] MRN: [id number] DOB: 07/10/1945 69 y.o. PCP: Shawna Graves, DO  Date of Admission: 07/04/2014  4:40 PM Date of Discharge: 07/06/2014 Attending Physician: Shawna Belt, MD  Discharge Diagnosis:  Active Problems:   COPD exacerbation   COPD with acute exacerbation   Thrombocytopenia  Discharge Medications:   Medication List    STOP taking these medications        levofloxacin 500 MG tablet  Commonly known as:  LEVAQUIN      TAKE these medications        acetaminophen 500 MG tablet  Commonly known as:  TYLENOL  Take 500 mg by mouth every 6 (six) hours as needed for moderate pain.     albuterol (2.5 MG/3ML) 0.083% nebulizer solution  Commonly known as:  PROVENTIL  Take 2.5 mg by nebulization 4 (four) times daily. For shortness of breath     Albuterol Sulfate 108 (90 BASE) MCG/ACT Aepb  Commonly known as:  PROAIR RESPICLICK  Inhale 2 puffs into the lungs every 4 (four) hours as needed.     ALPRAZolam 0.25 MG tablet  Commonly known as:  XANAX  Take 0.25 mg by mouth 2 (two) times daily.     aspirin 81 MG chewable tablet  Chew 1 tablet (81 mg total) by mouth daily.     BIOTENE DRY MOUTH Gel  Place 1 application onto teeth 2 (two) times daily as needed (dry mouth).     carvedilol 3.125 MG tablet  Commonly known as:  COREG  Take 3.125 mg by mouth daily.     cyanocobalamin 1000 MCG/ML injection  Commonly known as:  (VITAMIN B-12)  Inject 1,000 mcg into the muscle every 30 (thirty) days. Given on the 10th of each month     doxycycline 100 MG tablet  Commonly known as:  VIBRA-TABS  Take 1 tablet (100 mg total) by mouth every 12 (twelve) hours.     esomeprazole 20 MG capsule  Commonly known as:  NEXIUM  Take 20 mg by mouth daily at 12 noon.     fluticasone 50 MCG/ACT nasal spray  Commonly known as:  FLONASE  Place 2 sprays into both nostrils as needed for allergies.     furosemide 20 MG tablet  Commonly known as:  LASIX  TAKE ONE  TO TWO TABLETS BY MOUTH ONCE DAILY AS NEEDED FOR EDEMA OR  FLUID     guaiFENesin 600 MG 12 hr tablet  Commonly known as:  MUCINEX  Take 1 tablet (600 mg total) by mouth 2 (two) times daily as needed.     ipratropium 0.02 % nebulizer solution  Commonly known as:  ATROVENT  Take 2.5 mLs (0.5 mg total) by nebulization 4 (four) times daily. Dx 496     KLOR-CON M20 20 MEQ tablet  Generic drug:  potassium chloride SA  Take 20 mEq by mouth daily.     loratadine 10 MG tablet  Commonly known as:  CLARITIN  Take 1 tablet (10 mg total) by mouth daily.     meloxicam 15 MG tablet  Commonly known as:  MOBIC  Take 15 mg by mouth daily.     morphine 10 MG/5ML solution  Take 2.5 mLs (5 mg total) by mouth every 4 (four) hours as needed (shortness of breath).     polyethylene glycol packet  Commonly known as:  MIRALAX / GLYCOLAX  Take 17 g by mouth every other day.     predniSONE 20 MG tablet  Commonly known as:  DELTASONE  Take 3 tablets (60 mg total) by mouth daily with breakfast.     Tiotropium Bromide-Olodaterol 2.5-2.5 MCG/ACT Aers  Commonly known as:  STIOLTO RESPIMAT  Inhale 2.5 mcg into the lungs daily. Inhale 2 puffs once daily     verapamil 180 MG CR tablet  Commonly known as:  CALAN-SR  Take 180 mg by mouth at bedtime.        Disposition and follow-up:   Ms.Shawna [name] was discharged from [facility] in Stable condition.  At the hospital follow up visit please address:  1. Please assess her breathing status. We asked her to cont taking prednisone '60mg'$  daily until she sees Dr. Elsworth [name].   Patient needs discussion about hospice. We felt it would be better discussed by Dr. Elsworth [name] since he knows her better.  Please make sure her LLL nodule is being followed outpatient.  2.  Labs / imaging needed at time of follow-up:   3.  Pending labs/ test needing follow-up:   Follow-up Appointments:     Follow-up Information    Follow up with PARRETT,TAMMY, NP On  07/13/2014.   Specialty:  Nurse Practitioner   Why:  @ 11:45 AM.   Contact information:   [address] [address] [phone]       Discharge Instructions:   Consultations:    Procedures Performed:  Dg Chest Port 1 View  07/04/2014   CLINICAL DATA:  Respiratory distress for 1 week.  EXAM: PORTABLE CHEST - 1 VIEW  COMPARISON:  Chest radiograph 03/15/2014  FINDINGS: Monitoring leads overlie the patient. Normal cardiac and mediastinal contours. Stable postsurgical changes involving the right hemi thorax. No large consolidative pulmonary opacities. No pleural effusion or pneumothorax. AC joint degenerative changes.  IMPRESSION: Stable postoperative changes involving the right hemi thorax.  No acute cardiopulmonary process.   Electronically Signed   By: Shawna [name] M.D.   On: 07/04/2014 17:08    Admission HPI:   Shawna [name] is a 69 year old woman with history of COPD-GOLD stage IV, left lower lobe pulmonary nodule, HTN, osteoarthritis, and GERD presenting with shortness of breath. The patient has severe COPD, and she is chronically on home oxygen. She reports worsening shortness of breath over the past week associated with wheezing unresponsive to her home nebulizers. She reports coughing up yellow/green sputum that is occasionally brown in color. At baseline, she does not have sputum production. She reports 2 episodes of vomiting when looking at her sputum. Per the patient, she has a very weak stomach. Her dyspnea is worse with minimal activity. She denies any chest pain, fever, congestion, or sore throat. She reports having at least 4 hospitalizations for her COPD over the last year. A couple weeks ago, her pulmonologist called in a prescription for Levaquin, which she completed. She has also been taking prednisone 10 mg daily over the last month, since her last appointment with her pulmonologist. She reports increasing lower extremity swelling over the last few weeks, but  she reports that it is improved today. She reports having intermittent palpitations. She reportedly saw her primary care doctor and reportedly was told she has intermittent atrial fibrillation. She is scheduled to see her pulmonary doctor tomorrow, and she was trying to make it to that appointment. However, her symptoms continued to progress, and she called EMS.  In the ER, she was given IV steroids and nebulizers with some improvement, but she continued to have decreased air  movement and wheezing bilaterally.   Hospital Course by problem list: Active Problems:   COPD exacerbation   COPD with acute exacerbation   Thrombocytopenia   69 yo female with COPD gold stage 4, HTN, here with copd exacerbation  AEoCOPD - unclear etiology, likely pollen could have played a role. Has gold stage 4 COPD, managed by Dr. Elsworth [name] pulm. She has been put on morphine PRN for her dyspnea by pulm. -patient has reached the maximum in terms of treatments of her end stage COPD. She remains very dyspneic at baseline with some improvement with nebulizers. She has been using morphine for relief. - at home, on prednisone '10mg'$  daily, albuterol, proair, atrovent, tiotropium bromide-olodaterol, flonase, zyrtec. Also recently finished a course of levaquin. On mucinex. We continued her home COPD regimen.  - SOB  improved somewhat with IV solumedrol. will send her home with '60mg'$  prednisone until she sees Dr. Elsworth [name]. -patient needs discussion for hospice care by outpatient Dr. Elsworth [name] since he knows patient better.  - cont COPD exct treatment with Doxycycline - will do for 2 more days for 5 days total. - cont morphine '5mg'$  BID PRN for semi-palliative measures for her end stage copd. - DNR  Reported papitation-  - repeated EKG shows NSR with PVCs. HR was as low as 48 on tele. Could have tachy-brady syndrome. Could be caused by her pulm disease. - started asa '81mg'$ .  HTN -normotensive now @ home on coreg 3.'125mg'$  + verapamil '180mg'$   daily - cont these.   LLL pulm nodule - 1.6x10 cm LLL nodule. Stable compared to CT 03/05/2014. - has been followed by ong and radonc outpatient with serial CT's. Plan is CT in June again. F/up outpatient team after discharge.  - also s/p RUP lobectomy April 2013 which was a benign nodule.  Lower ext edema - uses lasix PRN.  - EcHO 2013 - normal EF and normal diastolic dysfunction. - euvolemic currently. Hold for now, can continue using PRN.   GERD - cont ppi  OA - cont home tylenol + meloxicam.  Prolonged QTC - avoided QT prolonging drugs.  DNR  Diet: regular.  Discharge Vitals:   BP 135/77 mmHg  Pulse 88  Temp(Src) 99 F (37.2 C) (Oral)  Resp 18  Ht '5\' 7"'$  (1.702 m)  Wt 112 lb 6.4 oz (50.984 kg)  BMI 17.60 kg/m2  SpO2 98%  Discharge Labs:  Results for orders placed or performed during the hospital encounter of 07/04/14 (from the past 24 hour(s))  MRSA PCR Screening     Status: None   Collection Time: 07/05/14  8:00 PM  Result Value Ref Range   MRSA by PCR NEGATIVE NEGATIVE  Technologist smear review     Status: None   Collection Time: 07/06/14  4:48 AM  Result Value Ref Range   Tech Review TOXIC GRANULATION   Troponin I     Status: None   Collection Time: 07/06/14  4:48 AM  Result Value Ref Range   Troponin I <0.03 <0.031 ng/mL    Signed: Dellia Nims, MD 07/06/2014, 2:09 PM    Services Ordered on Discharge:  Equipment Ordered on Discharge:

## 2014-07-06 NOTE — Progress Notes (Signed)
Subjective: Continues to feel sob. Not sure if SOB is much different from her normal baseline but does feel little better after nebulizer treatments. Has been using morphine as needed for SOB at home. Patient's daughter also give [place] hx of heart rate being slow and also fast in the past, which was thought to be from her lung disease.   Not sure what caused her worsening, but does think pollen may have played a role. Asked about aspirin allergy, she said she had taken it without problem. Denies any chest pain, n/v/ fever, chills.   Objective: Vital signs in last 24 hours: Filed Vitals:   07/05/14 2050 07/06/14 0145 07/06/14 0149 07/06/14 0500  BP: 122/80 140/84  149/91  Pulse: 93 93  83  Temp: 98 F (36.7 C) 98 F (36.7 C)  98 F (36.7 C)  TempSrc: Oral Oral  Oral  Resp:    22  Height:      Weight:    112 lb 6.4 oz (50.984 kg)  SpO2: 98% 95% 93% 93%   Weight change: -5 lb 9.6 oz (-2.54 kg)  Intake/Output Summary (Last 24 hours) at 07/06/14 0829 Last data filed at 07/06/14 0731  Gross per 24 hour  Intake    360 ml  Output   1602 ml  Net  -1242 ml   Vitals reviewed. General: resting in bed, NAD HEENT: PERRL, EOMI, no scleral icterus Cardiac: RRR, no rubs, murmurs or gallops Pulm: decreased Breathe sound on RUL, s/p RUL pneumonectomy, hyperresonant, no wheezing currently. Has good air movement. Abd: soft, nontender, nondistended, BS present Ext: warm and well perfused, no pedal edema Neuro: alert and oriented X3, cranial nerves II-XII grossly intact, strength and sensation to light touch equal in bilateral upper and lower extremities  Lab Results: Basic Metabolic Panel:  Recent Labs Lab 07/04/14 1648 07/05/14 0410  NA 138 139  K 4.1 4.1  CL 93* 94*  CO2 33* 34*  GLUCOSE 81 149*  BUN 21 22  CREATININE 1.05 1.08  CALCIUM 9.0 8.8   Liver Function Tests:  Recent Labs Lab 07/05/14 0410  AST 22  ALT 14  ALKPHOS 56  BILITOT 0.6  PROT 5.8*  ALBUMIN 2.9*   No  results for input(s): LIPASE, AMYLASE in the last 168 hours. No results for input(s): AMMONIA in the last 168 hours. CBC:  Recent Labs Lab 07/04/14 1648 07/05/14 0410  WBC 8.0 5.8  HGB 14.8 13.1  HCT 46.0 41.5  MCV 95.4 95.6  PLT 102* 92*   Cardiac Enzymes:  Recent Labs Lab 07/05/14 0410 07/06/14 0448  TROPONINI 0.05* <0.03   BNP: No results for input(s): PROBNP in the last 168 hours. Misc. Labs:  Micro Results: Recent Results (from the past 240 hour(s))  MRSA PCR Screening     Status: None   Collection Time: 07/05/14  8:00 PM  Result Value Ref Range Status   MRSA by PCR NEGATIVE NEGATIVE Final    Comment:        The GeneXpert MRSA Assay (FDA approved for NASAL specimens only), is one component of a comprehensive MRSA colonization surveillance program. It is not intended to diagnose MRSA infection nor to guide or monitor treatment for MRSA infections.    Studies/Results: Dg Chest Port 1 View  07/04/2014   CLINICAL DATA:  Respiratory distress for 1 week.  EXAM: PORTABLE CHEST - 1 VIEW  COMPARISON:  Chest radiograph 03/15/2014  FINDINGS: Monitoring leads overlie the patient. Normal cardiac and mediastinal contours. Stable  postsurgical changes involving the right hemi thorax. No large consolidative pulmonary opacities. No pleural effusion or pneumothorax. AC joint degenerative changes.  IMPRESSION: Stable postoperative changes involving the right hemi thorax.  No acute cardiopulmonary process.   Electronically Signed   By: Lovey Newcomer M.D.   On: 07/04/2014 17:08   Medications: I have reviewed the patient's current medications. Scheduled Meds: . ALPRAZolam  0.25 mg Oral BID  . aspirin  81 mg Oral Daily  . benzonatate  100 mg Oral BID  . carvedilol  3.125 mg Oral Daily  . doxycycline  100 mg Oral Q12H  . ipratropium-albuterol  3 mL Nebulization Q6H  . loratadine  10 mg Oral Daily  . meloxicam  15 mg Oral Daily  . methylPREDNISolone (SOLU-MEDROL) injection  60 mg  Intravenous Q6H  . pantoprazole  40 mg Oral Daily  . polyethylene glycol  17 g Oral QODAY  . sodium chloride  3 mL Intravenous Q12H  . Tiotropium Bromide-Olodaterol  2.5 mcg Inhalation Daily  . verapamil  180 mg Oral QHS   Continuous Infusions:  PRN Meds:.acetaminophen, antiseptic oral rinse, guaiFENesin, morphine CONCENTRATE Assessment/Plan: Active Problems:   COPD exacerbation   COPD with acute exacerbation   Thrombocytopenia  69 yo female with COPD gold stage 4, HTN, here with copd exacerbation  AEoCOPD - unclear etiology, likely pollen could have played a role. Has gold stage 4 COPD, managed by Dr. Elsworth Soho pulm. She has been put on morphine PRN for her dyspnea by pulm. -patient has reached the maximum in terms of treatments of her end stage COPD. She remains very dyspneic at baseline with some improvement with nebulizers. She has been using morphine for relief, which sounds more of a palliative measure.  - at home, on prednisone '10mg'$  daily, albuterol, proair, atrovent, tiotropium bromide-olodaterol, flonase, zyrtec. Also recently finished a course of levaquin. On mucinex. -patient needs discussion for hospice care by outpatient Dr. Elsworth Soho since he knows patient better.  - SOB somewhat improved with current higher dose of steroidal treatment. will send her home with '60mg'$  prednisone until she sees Dr. Elsworth Soho - cont COPD exct treatment with Doxycycline - will do for 2 more days.   - cont morphine '5mg'$  BID PRN for semi-palliative measures for her end stage copd. - DNR  Reported papitation-  - repeated EKG shows NSR with PVCs. HR was as low as 48 on tele. Could have tachy-brady syndrome. Could be caused by her pulm disease. - started asa '81mg'$ .  HTN -normotensive now @ home on coreg 3.'125mg'$  + verapamil '180mg'$  daily - cont these.   LLL pulm nodule - 1.6x10 cm LLL nodule. Stable compared to CT 03/05/2014. - has been followed by ong and radonc outpatient with serial CT's. Plan is CT in June  again. F/up outpatient team after discharge  - also s/p RUP lobectomy April 2013 which was a benign nodule.  Lower ext edema - uses lasix PRN.  - EcHO 2013 - normal EF and normal diastolic dysfunction. - euvolemic currently. Hold for now, can continue using PRN.   GERD - cont ppi  OA - cont home tylenol + meloxicam.  Prolonged QTC - avoid QT prolonging drugs.  DNR  Diet: regular.   Dispo: Disposition is deferred at this time, awaiting improvement of current medical problems.  Anticipated discharge in approximately 1-2 day(s).   The patient does have a current PCP [name], DO) and does need an [facility] hospital follow-up appointment after discharge.  The patient does have transportation  limitations that hinder transportation to clinic appointments.  .Services Needed at time of discharge: Y = Yes, Blank = No PT:   OT:   RN:   Equipment:   Other:     LOS: 2 days   Shawna Nims, MD 07/06/2014, 8:29 AM

## 2014-07-06 NOTE — Progress Notes (Signed)
Physical Therapy Treatment Patient Details Name: SHAKETTA RILL MRN: [id number] DOB: 07-30-1945 Today's Date: 07/06/2014    History of Present Illness Pt admitted with COPD exacerbation.    PT Comments    Patient seen for mobility progression and ambulation. Patient with increased WOBing, O2 saturations fluctuating between 90-98% on 4 liters with ambulation (desaturating to 84% on 2 liters) Patient HR 106. Increased anxiety and frustration initially, improved with rest.   Follow Up Recommendations  No PT follow up     Equipment Recommendations  None recommended by PT    Recommendations for Other Services       Precautions / Restrictions Precautions Precaution Comments: 02 dependent at baseline Restrictions Weight Bearing Restrictions: No    Mobility  Bed Mobility Overal bed mobility: Modified Independent                Transfers Overall transfer level: Needs assistance Equipment used: None Transfers: Sit to/from Stand Sit to Stand: Supervision         General transfer comment: from bed as well as from toilet  Ambulation/Gait Ambulation/Gait assistance: Min guard Ambulation Distance (Feet): 140 Feet (one standing rest break, one seated rest break increased O2) Assistive device: None Gait Pattern/deviations: Step-through pattern;Drifts right/left Gait velocity: decreased Gait velocity interpretation: Below normal speed for age/gender General Gait Details: Amb with step through pattern with min/guard for steadying. Amb on 4 L/min with o2 >90%   Stairs            Wheelchair Mobility    Modified Rankin (Stroke Patients Only)       Balance     Sitting balance-Leahy Scale: Good       Standing balance-Leahy Scale: Fair                      Cognition Arousal/Alertness: Awake/alert Behavior During Therapy: WFL for tasks assessed/performed Overall Cognitive Status: Within Functional Limits for tasks assessed                       Exercises      General Comments General comments (skin integrity, edema, etc.): patient with increased WOBing, O2 saturations fluctuating between 90-98% on 4 liters with ambulation (desaturating to 84% on 2 liters) Patient HR 106. Increased anxiety and frustration initially, improved with rest.       Pertinent Vitals/Pain Pain Assessment: No/denies pain    Home Living                      Prior Function            PT Goals (current goals can now be found in the care plan section) Acute Rehab PT Goals Patient Stated Goal: be able to take care of her home PT Goal Formulation: With patient Time For Goal Achievement: 07/19/14 Potential to Achieve Goals: Good Progress towards PT goals: Progressing toward goals    Frequency  Min 3X/week    PT Plan Current plan remains appropriate    Co-evaluation             End of Session Equipment Utilized During Treatment: Oxygen Activity Tolerance: Patient limited by fatigue Patient left: in bed;with call bell/phone within reach;Other (comment)     Time: 1443-1540 PT Time Calculation (min) (ACUTE ONLY): 22 min  Charges:  $Gait Training: 8-22 mins                    G Codes:  Duncan Dull 07/06/2014, 10:02 AM Alben Deeds, PT DPT  [phone]

## 2014-07-06 NOTE — Significant Event (Signed)
Discussed all discharge instructions with patient and daughter.  Medications to be picked up at [facility], instructed patient on upcoming appointments.  Patient has own Oxygen to be discharged to go home with.  All patient belongings gathered.  No questions concerning discharge.

## 2014-07-07 NOTE — Telephone Encounter (Addendum)
lmtcb for Vicky.

## 2014-07-07 NOTE — Telephone Encounter (Signed)
[phone] vicky calling back

## 2014-07-07 NOTE — Telephone Encounter (Signed)
LMTCB x 1 for [facility]

## 2014-07-08 NOTE — Telephone Encounter (Signed)
lmtcb for Vicky.

## 2014-07-09 NOTE — Telephone Encounter (Signed)
Patient was foggy, pale, SOB, very sluggish and couldn't sit up straight, felt better to lean forward.  Patient passed out.  Daughter called EMS, patient was taken to [facility].  She was admitted and given breathing treatments.  When daughter went to see her, her IV was not hooked up, her O2 was 91% at the time.  The nurse told her that they were trying to get respiratory there to give her breathing treatment and they did not arrive until 8pm that night.  Daughter was surprised that the IV was not hooked up due to the fact that patient was so weak.  Patient was released on Tuesday and daughter was surprised that she was released so soon.  Patient said that the doctor told her there was nothing else that they could do for her.  Daughter was upset because her mom still did not have her strength back when she was sent home. Daughter is upset about the experience she had at [facility].    Patient was sent home with an antibiotic and prednisone.  Discharge summary recommended discussion about Hospice.  FYI to RA.

## 2014-07-09 NOTE — Telephone Encounter (Signed)
Left message for Shawna [name] to call back.

## 2014-07-09 NOTE — Telephone Encounter (Signed)
Pl let daughter know that I have reviewed dc summary & CXR How can we help her? Does she need an OV early next week? Should we ask for a home PT assessment?

## 2014-07-12 NOTE — Telephone Encounter (Signed)
Vicky returned call  [phone]

## 2014-07-12 NOTE — Telephone Encounter (Signed)
Spoke with Shawna [name] and advised of Dr Bari Mantis recs.  She states pt is doing some better and has an appt with TP tomorrow.  They will see her and see if she has anymore recs.

## 2014-07-12 NOTE — Telephone Encounter (Signed)
LM for Vicky to return call.

## 2014-07-13 ENCOUNTER — Encounter: Payer: Self-pay | Admitting: Adult Health

## 2014-07-13 ENCOUNTER — Ambulatory Visit (INDEPENDENT_AMBULATORY_CARE_PROVIDER_SITE_OTHER): Payer: Medicare Other | Admitting: Adult Health

## 2014-07-13 ENCOUNTER — Telehealth: Payer: Self-pay | Admitting: Adult Health

## 2014-07-13 VITALS — BP 122/78 | HR 94 | Temp 97.8°F | Ht 66.5 in | Wt 115.6 lb

## 2014-07-13 DIAGNOSIS — B370 Candidal stomatitis: Secondary | ICD-10-CM

## 2014-07-13 DIAGNOSIS — J9611 Chronic respiratory failure with hypoxia: Secondary | ICD-10-CM

## 2014-07-13 DIAGNOSIS — R911 Solitary pulmonary nodule: Secondary | ICD-10-CM

## 2014-07-13 DIAGNOSIS — J449 Chronic obstructive pulmonary disease, unspecified: Secondary | ICD-10-CM

## 2014-07-13 MED ORDER — CLOTRIMAZOLE 10 MG MT TROC: 10.0000 mg | Freq: Every day | OROMUCOSAL | Status: DC

## 2014-07-13 MED ORDER — PREDNISONE 10 MG PO TABS: 10.0000 mg | ORAL_TABLET | Freq: Every day | ORAL | Status: DC

## 2014-07-13 NOTE — Patient Instructions (Addendum)
Stop Ipratropium Neb .  Continue on Stioltio 2 puffs daily  Albuterol Neb every 4hrs as needed for wheezing /shortness of breath.  Continue on Prednisone '10mg'$  daily .  CT chest in June as planned  Mycelex five times daily for 1 week  follow up Dr. Elsworth Soho  In 6 weeks and As needed   Please contact office for sooner follow up if symptoms do not improve or worsen or seek emergency care

## 2014-07-13 NOTE — Progress Notes (Signed)
Subjective:    Patient ID: Shawna [name], female    DOB: 1945/04/20, 69 y.o.   MRN: [id number]  HPI  69 year old ex smoker with COPD -gold B, for FU of persistent dyspnea.  RUL lobectomy in April 2013 for benign lesion (hendrickson) She has been on home O2 since 08/2011 (by PCP)  Quit smoking 05/2011 but relapsed in 2014  ABGs showed compensated hypercarbia.   Due to persistent dyspnea & hoarseness - advair/ breo was held, ENT eval neg Persistent dyspnea , in spite of multiple rounds of antibiotics and steroids- significant component of anxiety -increase in xanax to tid helped, finally added low-dose morphine after hospitalization 02/2014  Significant tests/ events  3/ 2013 PFTs - DLCO 50%, FEV1 1.68 - 64% (pre-op)  PFTs 07/2013  severe airway obstruction with FEV1 of 0.95-37% and DLCO of 38%  12/2012  PET positive Lt lower lung nodule increasing in size, underwent bronchoscopy 01/2013 and developed Lt PTX after procedure.  Pathology - showed atypical cells. BAL showed M.Cat -treated  Hyponatremia wu - urine lytes, cortisol, and Tsh were nml, HCTZ was stopped.  pelvic US.  - PET positive area on left ovary not seen  Interim -    CT 1/22 /15 showed Recurrent LLL atelectasis without obstructive lesion   CT chest 07/2013 - 1.2 x 1.2 cm irregular left lower lobe pulmonary nodule continues to progress comparing back over multiple studies to 05/10/2011. 12/25/2012 -measured 8 x 10 mm.  PET 08/2013 Three intensely hypermetabolic lesions along the left upper mediastinum superior to the left hilum are concerning for carcinoma. Difficult to define soft tissue lesions to correspond these foci ? Periaortic LNs, ovarian process appears benign  01/2014 Seen by Dr. Valere Dross Radiation Oncology &  Dr. Inda Merlin 09/2013 w/ plans to follow nodule serially - Case was discussed at thoracic oncology conference  02/2014 CT chest - no change in left lower lobe nodule    06/02/2014  Chief Complaint    Patient presents with  . Follow-up    pt complains of sob daily, non-productive cough, wheezing. Pt has pain in right side where procedure was performed.   Accompanied by daughter 05/19/14 DOXY + pred Patient was admitted December 26 2 December 31 for acute COPD exacerbation She was treated with IV antibiotics, steroids and nebulized bronchodilators. Viral panels were negative  DO NOT RESUSCITATE CODE STATUS Morphine was really helping her dyspnea- but now she feels that she may be getting immune to this.  On O2 at 2ll/m.  Feels her QOL has worsened again She is back on advair and again complains of hoarseness  She denied any chest pain, orthopnea, PND or leg swelling.  07/13/2014 Post hospital follow up  Pt returns for post hospital follow up .  Admitted 4/17-4/19 for AECOPD  Treated with abx, steroids and nebs.  Discharged on prednisone '60mg'$  , she finished this is back on prednisone '10mg'$  daily .  She is feeling better but still weak. Gets winded with minimal activity . Not back to baseline yet.  She remains on MSO4 as needed. Believes this helps her anxiety as well.  On Stioltio . Also using atrovent nebs Four times a day  .  We discussed hospice referral , she [facility] are interested.  Has upcoming CT chest in June to follow known lung nodule. No chest pain, orthopnea, edema or fever.  Complains of sore tongue  Review of Systems neg for any significant sore throat, dysphagia, itching, sneezing, nasal congestion or  excess/ purulent secretions, fever, chills, sweats, unintended wt loss, pleuritic or exertional cp, hempoptysis, orthopnea pnd or change in chronic leg swelling. Also denies presyncope, palpitations, heartburn, abdominal pain, nausea, vomiting, diarrhea or change in bowel or urinary habits, dysuria,hematuria, rash, arthralgias, visual complaints, headache, numbness weakness or ataxia.     Objective:   Physical Exam  Gen. Pleasant, thin, frail and chronically ill  appearing , in no distress ENT - scattered white patches along tongue  Neck: No JVD, no thyromegaly, no carotid bruits Lungs: no use of accessory muscles, no dullness to percussion, decreased without rales or rhonchi  Cardiovascular: Rhythm regular, heart sounds  normal, no murmurs or gallops, no peripheral edema Musculoskeletal: No deformities, no cyanosis or clubbing        Assessment & Plan:

## 2014-07-13 NOTE — Telephone Encounter (Signed)
Pt daughter cb, spoke with Parke Poisson and she has sent rx to [facility] in [place], informed daughter of this and she verbalized understanding, nothing further needed

## 2014-07-14 DIAGNOSIS — B370 Candidal stomatitis: Secondary | ICD-10-CM | POA: Insufficient documentation

## 2014-07-14 NOTE — Assessment & Plan Note (Signed)
Oral care with inhalers  Mycelex five times daily for 1 week  follow up Dr. Elsworth Soho  In 6 weeks and As needed   Please contact office for sooner follow up if symptoms do not improve or worsen or seek emergency care

## 2014-07-14 NOTE — Assessment & Plan Note (Signed)
Cont on O2 .  

## 2014-07-14 NOTE — Assessment & Plan Note (Signed)
Recurrent flare - She tapered prednisone to '10mg'$  already, will stay at this dose for now may need higher if flares persist +/- add QVAR .  Stop Ipratropium Neb as already  Continue on Stioltio 2 puffs daily  Albuterol Neb every 4hrs as needed for wheezing /shortness of breath.  Continue on Prednisone '10mg'$  daily .  Hospice referral   follow up Dr. Elsworth Soho  In 6 weeks and As needed   Please contact office for sooner follow up if symptoms do not improve or worsen or seek emergency care

## 2014-07-14 NOTE — Progress Notes (Signed)
Reviewed & agree with plan  

## 2014-07-14 NOTE — Assessment & Plan Note (Signed)
CT chest in June as planned  follow up Dr. Elsworth Soho  In 6 weeks and As needed   Please contact office for sooner follow up if symptoms do not improve or worsen or seek emergency care

## 2014-07-15 ENCOUNTER — Telehealth: Payer: Self-pay | Admitting: Pulmonary Disease

## 2014-07-15 NOTE — Telephone Encounter (Signed)
Called and spoke to pt. Pt stated her breathing is unchanged since seeing TP on 4/26. Pt stated she was expecting a change. Advised pt to call back if things worsen. Pt verbalized understanding and denied any further questions or concerns at this time.

## 2014-07-16 ENCOUNTER — Telehealth: Payer: Self-pay | Admitting: Pulmonary Disease

## 2014-07-16 NOTE — Telephone Encounter (Signed)
Patient notified.  Nothing further needed. 

## 2014-07-16 NOTE — Telephone Encounter (Signed)
Increase lasix 40 mg daily x 1 week , theback down ot 20 mg daily

## 2014-07-16 NOTE — Telephone Encounter (Signed)
Spoke with pt, c/o b/l feet swelling, R is worse than L.  Pt states this has been going on for approx 2 weeks. Pt is taking '20mg'$  lasix bid and wearing compression stockings regularly.  Is requesting further recs.  Dr. Elsworth Soho please advise on recs.  Thanks!

## 2014-07-19 ENCOUNTER — Telehealth: Payer: Self-pay | Admitting: Pulmonary Disease

## 2014-07-19 MED ORDER — FUROSEMIDE 20 MG PO TABS: 20.0000 mg | ORAL_TABLET | Freq: Every day | ORAL | Status: AC

## 2014-07-19 NOTE — Telephone Encounter (Signed)
Per previous phone message: Rigoberto Noel, MD at 07/16/2014 11:44 AM     Status: Signed       Expand All Collapse All   Increase lasix 40 mg daily x 1 week , theback down ot 20 mg daily      ---  Called spoke with pt. She reports she needs refill on lasix sent to pharm. I have done so. Nothing further needed

## 2014-07-26 ENCOUNTER — Telehealth: Payer: Self-pay | Admitting: Pulmonary Disease

## 2014-07-26 MED ORDER — MORPHINE SULFATE 10 MG/5ML PO SOLN: 5.0000 mg | ORAL | Status: DC | PRN

## 2014-07-26 NOTE — Telephone Encounter (Signed)
Pt calling to check on status of script to written for morphine [phone]

## 2014-07-26 NOTE — Telephone Encounter (Signed)
Pt last had refill on morphine 06/16/14 #500 ml Take 2.5 mLs (5 mg total) by mouth every 4 (four) hours as needed (shortness of breath). Please advise if okay to refill Dr. Elsworth Soho thanks

## 2014-07-26 NOTE — Telephone Encounter (Signed)
Ok to refill 

## 2014-07-26 NOTE — Telephone Encounter (Signed)
Rx refilled.  Prescription put in mail today.  Patient has been notified that mail has already gone out today and it will not leave the office until tomorrow afternoon.  Nothing further needed.

## 2014-07-27 ENCOUNTER — Telehealth: Payer: Self-pay | Admitting: Pulmonary Disease

## 2014-07-27 MED ORDER — CEFDINIR 300 MG PO CAPS: 300.0000 mg | ORAL_CAPSULE | Freq: Two times a day (BID) | ORAL | Status: DC

## 2014-07-27 MED ORDER — PREDNISONE 10 MG PO TABS: ORAL_TABLET | ORAL | Status: DC

## 2014-07-27 NOTE — Telephone Encounter (Signed)
[facility] will not fill prednisone.  The morphine is not helping.  She is having a lot of trouble breathing.  She was gasping for air all night last night.  She said she thinks that she is having trouble because she is out of prednisone.  She said she may need antibiotic.  She says if she does get an antibiotic, then she need magic mouthwash to go with it for thrush.  She says once in a while she will cough up brownish phlegm, she says that she will feel a little better when she is able to cough up the mucus, but her chest feels full and she cannot always get the phlegm out.    RA -please advise.

## 2014-07-27 NOTE — Telephone Encounter (Signed)
Pt aware of recs. RX sent in. Nothing further needed 

## 2014-07-27 NOTE — Telephone Encounter (Signed)
Prednisone 20 mg for one week, then 10 mg daily # 60 with 2 refills Omnicef 300 twice a day x 7 days

## 2014-07-29 ENCOUNTER — Other Ambulatory Visit: Payer: Self-pay | Admitting: *Deleted

## 2014-07-29 MED ORDER — MORPHINE SULFATE 10 MG/5ML PO SOLN: 5.0000 mg | ORAL | Status: DC | PRN

## 2014-07-30 ENCOUNTER — Telehealth: Payer: Self-pay | Admitting: Pulmonary Disease

## 2014-07-30 NOTE — Telephone Encounter (Signed)
Advised pt to take the most recent prescription the pharmacy to fill. She agreed. Nothing further was needed.

## 2014-08-05 ENCOUNTER — Ambulatory Visit (INDEPENDENT_AMBULATORY_CARE_PROVIDER_SITE_OTHER): Payer: Medicare Other | Admitting: Adult Health

## 2014-08-05 ENCOUNTER — Encounter: Payer: Self-pay | Admitting: Adult Health

## 2014-08-05 VITALS — BP 136/74 | HR 77 | Temp 97.5°F | Ht 66.0 in | Wt 108.8 lb

## 2014-08-05 DIAGNOSIS — J441 Chronic obstructive pulmonary disease with (acute) exacerbation: Secondary | ICD-10-CM

## 2014-08-05 MED ORDER — TIOTROPIUM BROMIDE-OLODATEROL 2.5-2.5 MCG/ACT IN AERS: 2.0000 | INHALATION_SPRAY | Freq: Every day | RESPIRATORY_TRACT | Status: AC

## 2014-08-05 MED ORDER — PREDNISONE 10 MG PO TABS: ORAL_TABLET | ORAL | Status: DC

## 2014-08-05 NOTE — Progress Notes (Signed)
Subjective:    Patient ID: Shawna [name] Doctor, female    DOB: [birth year], 69 y.o.   MRN: [id number]  HPI  69 year old ex smoker with COPD -gold B, for FU of persistent dyspnea.  RUL lobectomy in April 2013 for benign lesion (hendrickson) She has been on home O2 since 08/2011 (by PCP)  Quit smoking 05/2011 but relapsed in 2014  ABGs showed compensated hypercarbia.   Due to persistent dyspnea & hoarseness - advair/ breo was held, ENT eval neg Persistent dyspnea , in spite of multiple rounds of antibiotics and steroids- significant component of anxiety -increase in xanax to tid helped, finally added low-dose morphine after hospitalization 02/2014  Significant tests/ events  3/ 2013 PFTs - DLCO 50%, FEV1 1.68 - 64% (pre-op)  PFTs 07/2013  severe airway obstruction with FEV1 of 0.95-37% and DLCO of 38%  12/2012  PET positive Lt lower lung nodule increasing in size, underwent bronchoscopy 01/2013 and developed Lt PTX after procedure.  Pathology - showed atypical cells. BAL showed M.Cat -treated  Hyponatremia wu - urine lytes, cortisol, and Tsh were nml, HCTZ was stopped.  pelvic US.  - PET positive area on left ovary not seen  Interim -    CT 1/22 /15 showed Recurrent LLL atelectasis without obstructive lesion   CT chest 07/2013 - 1.2 x 1.2 cm irregular left lower lobe pulmonary nodule continues to progress comparing back over multiple studies to 05/10/2011. 12/25/2012 -measured 8 x 10 mm.  PET 08/2013 Three intensely hypermetabolic lesions along the left upper mediastinum superior to the left hilum are concerning for carcinoma. Difficult to define soft tissue lesions to correspond these foci ? Periaortic LNs, ovarian process appears benign  01/2014 Seen by Dr. Valere Dross Radiation Oncology &  Dr. Inda Merlin 09/2013 w/ plans to follow nodule serially - Case was discussed at thoracic oncology conference  02/2014 CT chest - no change in left lower lobe nodule    06/02/2014  Chief Complaint    Patient presents with  . Follow-up    pt complains of sob daily, non-productive cough, wheezing. Pt has pain in right side where procedure was performed.   Accompanied by daughter 05/19/14 DOXY + pred Patient was admitted December 26 2 December 31 for acute COPD exacerbation She was treated with IV antibiotics, steroids and nebulized bronchodilators. Viral panels were negative  DO NOT RESUSCITATE CODE STATUS Morphine was really helping her dyspnea- but now she feels that she may be getting immune to this.  On O2 at 2ll/m.  Feels her QOL has worsened again She is back on advair and again complains of hoarseness  She denied any chest pain, orthopnea, PND or leg swelling.  07/13/14  Post hospital follow up  Pt returns for post hospital follow up .  Admitted 4/17-4/19 for AECOPD  Treated with abx, steroids and nebs.  Discharged on prednisone '60mg'$  , she finished this is back on prednisone '10mg'$  daily .  She is feeling better but still weak. Gets winded with minimal activity . Not back to baseline yet.  She remains on MSO4 as needed. Believes this helps her anxiety as well.  On Stioltio . Also using atrovent nebs Four times a day  .  We discussed hospice referral , she [facility] are interested.  Has upcoming CT chest in June to follow known lung nodule. No chest pain, orthopnea, edema or fever.  Complains of sore tongue >>>mycelelex   08/05/2014 Follow up COPD  Pt returns for slow to resolve COPD  flare  Recent AECOPD flare with admit last month  More congestion and dyspnea as she tapered pred to '10mg'$   Called in [place] and pred '20mg'$  last week.  Does feel better but feels worse on prednisone '10mg'$ .  No hemoptysis, chest pain, orthopnea, edema.  Hospice referral last ov , pt changed her mind and wants to wait-not a good experience .      Review of Systems neg for any significant sore throat, dysphagia, itching, sneezing, nasal congestion or excess/ purulent secretions, fever, chills,  sweats, unintended wt loss, pleuritic or exertional cp, hempoptysis, orthopnea pnd or change in chronic leg swelling. Also denies presyncope, palpitations, heartburn, abdominal pain, nausea, vomiting, diarrhea or change in bowel or urinary habits, dysuria,hematuria, rash, arthralgias, visual complaints, headache, numbness weakness or ataxia.     Objective:   Physical Exam  Gen. Pleasant, thin, frail and chronically ill appearing , in no distress ENT - scattered white patches along tongue  Neck: No JVD, no thyromegaly, no carotid bruits Lungs: no use of accessory muscles, no dullness to percussion, decreased without rales or rhonchi  Cardiovascular: Rhythm regular, heart sounds  normal, no murmurs or gallops, no peripheral edema Musculoskeletal: No deformities, no cyanosis or clubbing        Assessment & Plan:

## 2014-08-05 NOTE — Assessment & Plan Note (Signed)
Slow to resolve flare   Plan  Continue on Stioltio 2 puffs daily  Albuterol Neb every 4hrs as needed for wheezing /shortness of breath.  Increase Prednisone '20mg'$  daily .  CT chest in June as planned  Follow up Dr. Elsworth Soho  Next month as planned  and As needed   Please contact office for sooner follow up if symptoms do not improve or worsen or seek emergency care

## 2014-08-05 NOTE — Patient Instructions (Signed)
Continue on Stioltio 2 puffs daily  Albuterol Neb every 4hrs as needed for wheezing /shortness of breath.  Increase Prednisone '20mg'$  daily .  CT chest in June as planned  Follow up Dr. Elsworth Soho  Next month as planned  and As needed   Please contact office for sooner follow up if symptoms do not improve or worsen or seek emergency care

## 2014-08-06 NOTE — Progress Notes (Signed)
Reviewed & agree with plan  

## 2014-08-09 ENCOUNTER — Telehealth: Payer: Self-pay | Admitting: Pulmonary Disease

## 2014-08-09 NOTE — Telephone Encounter (Signed)
Low salt in diet Increase to 60 mg x 4 days then back down to 40 mg daily lasix

## 2014-08-09 NOTE — Telephone Encounter (Signed)
Spoke with pt, states her feet were swollen since her visit last week. Pt is taking 2-'20mg'$  of lasix daily. Pt has also been wearing compression hose sometimes, but states these are uncomfortable and has to take these off after a while.  Pt is having no breathing distress at this time.  Dr. Elsworth Soho please advise on recs.  Thanks!

## 2014-08-09 NOTE — Telephone Encounter (Signed)
lmtcb X1 for pt  

## 2014-08-09 NOTE — Telephone Encounter (Signed)
Patient notified.  Patient says that she spoke with her PCP and he sent her in a medication to take for 4 days.  I advised patient to let us know if she needs any further assistance.  Nothing further needed.

## 2014-08-20 ENCOUNTER — Telehealth: Payer: Self-pay | Admitting: Pulmonary Disease

## 2014-08-20 NOTE — Telephone Encounter (Signed)
lmtcb x1  We do not have samples at this time.

## 2014-08-20 NOTE — Telephone Encounter (Signed)
Pt returning call informed her that we did not have any of the samples that she was requesting.Shawna [name]

## 2014-09-02 ENCOUNTER — Ambulatory Visit (INDEPENDENT_AMBULATORY_CARE_PROVIDER_SITE_OTHER)
Admission: RE | Admit: 2014-09-02 | Discharge: 2014-09-02 | Disposition: A | Discharge status: Home / Self Care | Payer: Medicare Other | Source: Ambulatory Visit | Attending: Pulmonary Disease | Admitting: Pulmonary Disease

## 2014-09-02 ENCOUNTER — Other Ambulatory Visit: Payer: Medicare Other

## 2014-09-02 DIAGNOSIS — J9620 Acute and chronic respiratory failure, unspecified whether with hypoxia or hypercapnia: Secondary | ICD-10-CM | POA: Diagnosis not present

## 2014-09-02 DIAGNOSIS — J441 Chronic obstructive pulmonary disease with (acute) exacerbation: Secondary | ICD-10-CM

## 2014-09-07 ENCOUNTER — Ambulatory Visit (INDEPENDENT_AMBULATORY_CARE_PROVIDER_SITE_OTHER): Payer: Medicare Other | Admitting: Pulmonary Disease

## 2014-09-07 ENCOUNTER — Encounter: Payer: Self-pay | Admitting: Pulmonary Disease

## 2014-09-07 VITALS — BP 120/84 | HR 96 | Ht 66.6 in | Wt 104.0 lb

## 2014-09-07 DIAGNOSIS — J441 Chronic obstructive pulmonary disease with (acute) exacerbation: Secondary | ICD-10-CM

## 2014-09-07 DIAGNOSIS — I739 Peripheral vascular disease, unspecified: Secondary | ICD-10-CM

## 2014-09-07 DIAGNOSIS — J9611 Chronic respiratory failure with hypoxia: Secondary | ICD-10-CM | POA: Diagnosis not present

## 2014-09-07 MED ORDER — MORPHINE SULFATE 10 MG/5ML PO SOLN: 5.0000 mg | ORAL | Status: DC | PRN

## 2014-09-07 MED ORDER — PREDNISONE 10 MG PO TABS: ORAL_TABLET | ORAL | Status: DC

## 2014-09-07 NOTE — Patient Instructions (Signed)
Prednisone Take 4 tabs  daily with food x 7 days, then 3 tabs daily x 7 days, then 2 tabs daily  Referral to vascular Ensure/protein supplements OK You will qualify for hospice care Refill on morphine

## 2014-09-07 NOTE — Progress Notes (Signed)
Subjective:    Patient ID: Shawna [name], female    DOB: [birth year], 69 y.o.   MRN: [id number]  HPI  69 year old ex smoker with COPD -gold B, for FU of persistent dyspnea.  RUL lobectomy in April 2013 for benign lesion (hendrickson) She has been on home O2 since 08/2011 (by PCP)  Quit smoking 05/2011 but relapsed in 2014  ABGs show compensated hypercarbia.   Due to persistent dyspnea & hoarseness - advair/ breo was held, ENT eval neg Persistent dyspnea , in spite of multiple rounds of antibiotics and steroids- significant component of anxiety -increase in xanax to tid helped, finally added low-dose morphine after hospitalization 02/2014   09/07/2014  Chief Complaint  Patient presents with  . Follow-up    Review CT chest. States that breathing has been worse since last OV. Requests refill of morphine.   Accompanied by daughter On O2 at 2ll/m.  Feels her QOL has worsened again She developed purpuric discoloration of both feet-started on Lasix by PCP, badly bruised over both forearms She is back on advair and again complains of hoarseness  Admitted 06/2014 for AECOPD  She remains on MSO4 as needed. Believes this helps her anxiety as well.  On Stioltio . Also using atrovent nebs Four times a day  .  She has needed prednisone tapers almost every month  Hospice referraduring hospitalization, pt changed her mind  Significant tests/ events  3/ 2013 PFTs - DLCO 50%, FEV1 1.68 - 64% (pre-op)  PFTs 07/2013  severe airway obstruction with FEV1 of 0.95-37% and DLCO of 38%  12/2012  PET positive Lt lower lung nodule increasing in size, underwent bronchoscopy 01/2013 and developed Lt PTX after procedure.  Pathology - showed atypical cells. BAL showed M.Cat -treated  Hyponatremia wu - urine lytes, cortisol, and Tsh were nml, HCTZ was stopped.  pelvic US.  - PET positive area on left ovary not seen     CT chest 07/2013 - 1.2 x 1.2 cm irregular left lower lobe pulmonary nodule continues to  progress comparing back over multiple studies to 05/10/2011. 12/25/2012 -measured 8 x 10 mm.  PET 08/2013 Three intensely hypermetabolic lesions along the left upper mediastinum superior to the left hilum are concerning for carcinoma. Difficult to define soft tissue lesions to correspond these foci ? Periaortic LNs, ovarian process appears benign  01/2014 Seen by Dr. Valere Dross Radiation Oncology &  Dr. Inda Merlin 09/2013 w/ plans to follow nodule serially - Case was discussed at thoracic oncology conference  08/2014 CT chest-slight increase in size of nodule in superior segment left lower lobe, abutting the mediastinum, compared to 03/2013  Review of Systems neg for any significant sore throat, dysphagia, itching, sneezing, nasal congestion or excess/ purulent secretions, fever, chills, sweats, unintended wt loss, pleuritic or exertional cp, hempoptysis, orthopnea pnd or change in chronic leg swelling. Also denies presyncope, palpitations, heartburn, abdominal pain, nausea, vomiting, diarrhea or change in bowel or urinary habits, dysuria,hematuria, rash, arthralgias, visual complaints, headache, numbness weakness or ataxia.     Objective:   Physical Exam  Gen. Pleasant, cachectic, in no distress, depressed affect ENT - no lesions, no post nasal drip Neck: No JVD, no thyromegaly, no carotid bruits Lungs: no use of accessory muscles, no dullness to percussion, decreased without rales, scattered or rhonchi  Cardiovascular: Rhythm regular, heart sounds  normal, no murmurs or gallops, no peripheral edema Abdomen: soft and non-tender, no hepatosplenomegaly, BS normal. Musculoskeletal: No deformities, purple discoloration of both feet up to shins,  bilateral popliteal pulses palpable, no pulses in both feet Neuro:  alert, non focal       Assessment & Plan:

## 2014-09-08 NOTE — Assessment & Plan Note (Signed)
We discussed referral to hospice-she is concerned that she may may not be able to see her doctors or go to the hospital. I really emphasized-that further imaging with CT scans would not be of any benefit since. We do not have any therapeutic options. Her daughter seems to be of agreement that we should focus on her quality of life from here on. She would like to think some more before making a hospice referral

## 2014-09-08 NOTE — Assessment & Plan Note (Signed)
Prednisone taper starting at 40 mg-lower to about 20 mg and stated the stools

## 2014-09-08 NOTE — Assessment & Plan Note (Signed)
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 I think her mortality is more likely from her other problems other than this cancer

## 2014-09-08 NOTE — Assessment & Plan Note (Signed)
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

## 2014-09-10 ENCOUNTER — Other Ambulatory Visit: Payer: Self-pay

## 2014-09-10 ENCOUNTER — Encounter: Payer: Self-pay | Admitting: Vascular Surgery

## 2014-09-10 DIAGNOSIS — I739 Peripheral vascular disease, unspecified: Secondary | ICD-10-CM

## 2014-09-13 ENCOUNTER — Other Ambulatory Visit: Payer: Self-pay

## 2014-09-13 DIAGNOSIS — I739 Peripheral vascular disease, unspecified: Secondary | ICD-10-CM

## 2014-09-14 ENCOUNTER — Other Ambulatory Visit: Payer: Self-pay | Admitting: *Deleted

## 2014-09-14 ENCOUNTER — Encounter: Payer: Self-pay | Admitting: Vascular Surgery

## 2014-09-14 ENCOUNTER — Ambulatory Visit (INDEPENDENT_AMBULATORY_CARE_PROVIDER_SITE_OTHER): Payer: Medicare Other | Admitting: Vascular Surgery

## 2014-09-14 ENCOUNTER — Ambulatory Visit (HOSPITAL_COMMUNITY)
Admission: RE | Admit: 2014-09-14 | Discharge: 2014-09-14 | Disposition: A | Discharge status: Home / Self Care | Payer: Medicare Other | Source: Ambulatory Visit | Attending: Vascular Surgery | Admitting: Vascular Surgery

## 2014-09-14 VITALS — BP 124/89 | HR 84 | Resp 18 | Ht 66.5 in | Wt 104.8 lb

## 2014-09-14 DIAGNOSIS — I739 Peripheral vascular disease, unspecified: Secondary | ICD-10-CM | POA: Diagnosis not present

## 2014-09-14 NOTE — Progress Notes (Signed)
Patient name: Shawna [name] MRN: [id number] DOB: Jan 05, 1946 Sex: female   Referred by: Elsworth Soho  Reason for referral:  Chief Complaint  Patient presents with  . New Evaluation    c/o bilateral feet and ankle swelling and discoloration for 2 months, has worn knee high compression hose     HISTORY OF PRESENT ILLNESS: He is today for evaluation of discoloration of her lower extremities. She is a very ill appearing 69 year old female. She has multiple medical issues. 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 She has no history of peripheral vascular disease or lower extremity tissue loss. Over the past several months she has had increased swelling in her lower extremities and also has discoloration with what appears to be petechial hemorrhages from just above her ankle and onto her feet. She reports that she occasionally has color changes in her hands as well issues with ingrown toenails in her great toes bilaterally. She has scaling and peeling of her skin bilaterally as well. No true history of claudication type symptoms  Past Medical History  Diagnosis Date  . COPD (chronic obstructive pulmonary disease)   . HTN (hypertension)   . B12 deficiency   . OA (osteoarthritis)   . Hypokalemia   . Mass of lung   . GERD (gastroesophageal reflux disease)   . Diverticulosis of colon (without mention of hemorrhage)   . 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 Pulmonary nodule   . Atrial fibrillation     Past Surgical History  Procedure Laterality Date  . Carpal tunnel release      bilateral  . Tubal ligation      x 2  . Cervical spine surgery      plate with screws  . Lung surgery Right 07/06/11    RUL, per patient Dr Roxan Hockey  . Video bronchoscopy Bilateral 01/20/2013    Procedure: VIDEO BRONCHOSCOPY WITH FLUORO;  Surgeon: Rigoberto Noel, MD;  Location: [facility];  Service: Cardiopulmonary;  Laterality:  Bilateral;  . Cataract extraction w/phaco Left 11/30/2013    Procedure: CATARACT EXTRACTION PHACO AND INTRAOCULAR LENS PLACEMENT (IOC);  Surgeon: Tonny Branch, MD;  Location: [facility];  Service: Ophthalmology;  Laterality: Left;  CDE:  11.35  . Cataract surgery Left 2010    History   Social History  . Marital Status: Widowed    Spouse Name: N/A  . Number of Children: 3  . Years of Education: N/A   Occupational History  . RETIRED    Social History Main Topics  . Smoking status: Former Smoker -- 1.00 packs/day for 40 years    Types: Cigarettes    Quit date: 05/01/2012  . Smokeless tobacco: Never Used     Comment: resumed for a short time  . Alcohol Use: No  . Drug Use: No  . Sexual Activity: No   Other Topics Concern  . Not on file   Social History Narrative   Lives alone.   Daily caffeine     Family History  Problem Relation Age of Onset  . Coronary artery disease Mother 87  . Alzheimer's disease Mother   . Colon cancer    . COPD Father     Allergies as of 09/14/2014 - Review Complete 09/14/2014  Allergen Reaction Noted  . Aspirin Swelling 01/19/2013  . Percodan [oxycodone-aspirin] Other (See Comments) 02/02/2012  . Advair diskus [fluticasone-salmeterol] Other (See Comments) 07/04/2014  Current Outpatient Prescriptions on File Prior to Visit  Medication Sig Dispense Refill  . acetaminophen (TYLENOL) 500 MG tablet Take 500 mg by mouth every 6 (six) hours as needed for moderate pain.     Marland Kitchen albuterol (PROVENTIL) (2.5 MG/3ML) 0.083% nebulizer solution Take 2.5 mg by nebulization 4 (four) times daily. For shortness of breath    . Albuterol Sulfate (PROAIR RESPICLICK) 867 (90 BASE) MCG/ACT AEPB Inhale 2 puffs into the lungs every 4 (four) hours as needed. (Patient taking differently: Inhale 2 puffs into the lungs every 4 (four) hours as needed (for wheezing and shortness of breath). ) 1 each 5  . ALPRAZolam (XANAX) 0.25 MG tablet Take 0.25 mg by mouth 2 (two) times daily.     Marland Kitchen aspirin 81 MG chewable tablet Chew 1 tablet (81 mg total) by mouth daily. 90 tablet 0  . carvedilol (COREG) 3.125 MG tablet Take 3.125 mg by mouth daily. 1/2 tab twice a day    . cyanocobalamin (,VITAMIN B-12,) 1000 MCG/ML injection Inject 1,000 mcg into the muscle every 30 (thirty) days. Given on the 10th of each month    . Dentifrices (BIOTENE DRY MOUTH) GEL Place 1 application onto teeth 2 (two) times daily as needed (dry mouth).    . esomeprazole (NEXIUM) 20 MG capsule Take 20 mg by mouth daily at 12 noon.    . fluticasone (FLONASE) 50 MCG/ACT nasal spray Place 2 sprays into both nostrils as needed for allergies.     . furosemide (LASIX) 20 MG tablet Take 1 tablet (20 mg total) by mouth daily. 30 tablet 0  . guaiFENesin (MUCINEX) 600 MG 12 hr tablet Take 1 tablet (600 mg total) by mouth 2 (two) times daily as needed. (Patient taking differently: Take 600 mg by mouth 2 (two) times daily. )    . KLOR-CON M20 20 MEQ tablet Take 20 mEq by mouth daily.     Marland Kitchen loratadine (CLARITIN) 10 MG tablet Take 1 tablet (10 mg total) by mouth daily. 30 tablet 0  . morphine 10 MG/5ML solution Take 2.5 mLs (5 mg total) by mouth every 4 (four) hours as needed (shortness of breath). 500 mL 0  . polyethylene glycol (MIRALAX / GLYCOLAX) packet Take 17 g by mouth every other day.    . predniSONE (DELTASONE) 10 MG tablet 1-2 daily as directed 60 tablet 3  . predniSONE (DELTASONE) 10 MG tablet Take '40mg'$  daily with food x 7 days, then '30mg'$  x 7 days, then '20mg'$  daily to continue. 100 tablet 0  . Tiotropium Bromide-Olodaterol (STIOLTO RESPIMAT) 2.5-2.5 MCG/ACT AERS Inhale 2.5 mcg into the lungs daily. Inhale 2 puffs once daily 1 Inhaler 4  . verapamil (CALAN-SR) 180 MG CR tablet Take 180 mg by mouth at bedtime.      No current facility-administered medications on file prior to visit.     REVIEW OF SYSTEMS:  Positives indicated with an "X"  CARDIOVASCULAR:  '[ ]'$  chest pain   '[ ]'$  chest pressure   '[ ]'$  palpitations    [x orthopnea   [x ] dyspnea on exertion   '[ ]'$  claudication   '[ ]'$  rest pain   '[ ]'$  DVT   '[ ]'$  phlebitis PULMONARY:   [x ] productive cough   [x ] asthma   [x ] wheezing NEUROLOGIC:   [x ] weakness  [x ] paresthesias  '[ ]'$  aphasia  '[ ]'$  amaurosis  '[ ]'$  dizziness HEMATOLOGIC:   '[ ]'$  bleeding problems   '[ ]'$   clotting disorders MUSCULOSKELETAL:  '[ ]'$  joint pain   '[ ]'$  joint swelling GASTROINTESTINAL: '[ ]'$   blood in stool  '[ ]'$   hematemesis GENITOURINARY:  '[ ]'$   dysuria  '[ ]'$   hematuria PSYCHIATRIC:  '[ ]'$  history of major depression INTEGUMENTARY:  '[ ]'$  rashes  '[ ]'$  ulcers CONSTITUTIONAL:  '[ ]'$  fever   '[ ]'$  chills  PHYSICAL EXAMINATION:  General: The patient is a frail-appearing female, in no acute distress. She is wearing oxygen therapy Vital signs are BP 124/89 mmHg  Pulse 84  Resp 18  Ht 5' 6.5" (1.689 m)  Wt 104 lb 12.8 oz (47.537 kg)  BMI 16.66 kg/m2 Pulmonary: There is a good air exchange  Abdomen: Soft and non-tender  Musculoskeletal: There are no major deformities.  There is no significant extremity pain. Neurologic: No focal weakness or paresthesias are detected, Skin: Diffuse bruising over her body most particularly on her upper extremities. She has peeling of skin from the both feet. She has a ruborous changes from just above her ankle into her feet bilaterally. No evidence of gross infection does have some ulceration of her great toenail beds bilaterally Psychiatric: The patient has normal affect. Cardiovascular: 2+ popliteal pulses bilaterally. She has a 2+ anterior tibial pulse chest at her ankle on the left. I do not palpate pedal pulses on the right. She does have marked edema from her calves and onto her feet bilaterally.   [facility] Vascular Lab Studies:  Ordered and Independently Reviewed this shows normal triphasic posterior tibial waveforms bilaterally. Slightly decreased ankle arm index at 0.88 on the right 0.94  Impression and Plan:  Had long discussion with the patient and her daughter  present. She does have extensive reverse changes bilaterally. She has near-normal flow into her feet bilaterally by physical exam and by noninvasive studies. I'm unclear as to the etiology of her skin changes in this diffuse petechiae on her feet. Would not recommend any further arteriogram or other workup since I do not feel that there is any option for treatment. I do not feel that she has any risk for limb loss. She will follow-up with [place] on as-needed basis    Xzavier Swinger [facility] Office: [phone]

## 2014-09-15 ENCOUNTER — Other Ambulatory Visit: Payer: Self-pay | Admitting: Pulmonary Disease

## 2014-10-18 ENCOUNTER — Telehealth: Payer: Self-pay | Admitting: Pulmonary Disease

## 2014-10-18 NOTE — Telephone Encounter (Signed)
Spoke with pt. States that she needs a sample of Stiolto. Advised her that we do not have samples at this time. Nothing further was needed.

## 2014-10-21 ENCOUNTER — Telehealth: Payer: Self-pay | Admitting: Pulmonary Disease

## 2014-10-21 MED ORDER — MORPHINE SULFATE 10 MG/5ML PO SOLN: 5.0000 mg | ORAL | Status: DC | PRN

## 2014-10-21 NOTE — Telephone Encounter (Signed)
lmomtcb x1 

## 2014-10-21 NOTE — Telephone Encounter (Signed)
Ok to refill 

## 2014-10-21 NOTE — Telephone Encounter (Signed)
RX signed and placed in outgoing mail.  Pt is aware. Nothing further needed

## 2014-10-21 NOTE — Telephone Encounter (Signed)
[phone] calling back

## 2014-10-21 NOTE — Telephone Encounter (Signed)
Spoke w/ pt. She is requesting refill on morphine '10mg'$ /71m Last refilled 09/07/14 #500 ml Take 2.5 mLs (5 mg total) by mouth every 4 (four) hours as needed (shortness of breath). Pt wants this mailed out to her (confirmed mailing address).  She reports she will run out about Tuesday/wednesday. RA not back in office until Monday. Dr. YAnnamaria Bootsplease advise if you will sign RX? thanks

## 2014-11-09 ENCOUNTER — Encounter: Payer: Self-pay | Admitting: Adult Health

## 2014-11-09 ENCOUNTER — Ambulatory Visit (INDEPENDENT_AMBULATORY_CARE_PROVIDER_SITE_OTHER): Payer: Medicare Other | Admitting: Adult Health

## 2014-11-09 VITALS — BP 138/84 | HR 91 | Temp 98.2°F | Ht 66.0 in | Wt 106.0 lb

## 2014-11-09 DIAGNOSIS — J9611 Chronic respiratory failure with hypoxia: Secondary | ICD-10-CM

## 2014-11-09 DIAGNOSIS — J449 Chronic obstructive pulmonary disease, unspecified: Secondary | ICD-10-CM | POA: Diagnosis not present

## 2014-11-09 NOTE — Progress Notes (Signed)
Subjective:    Patient ID: Shawna [name] Doctor, female    DOB: 1945/09/15, 69 y.o.   MRN: [id number]  HPI 69 year old ex smoker with COPD -gold B, for FU of persistent dyspnea.  RUL lobectomy in April 2013 for benign lesion (hendrickson) She has been on home O2 since 08/2011 (by PCP)  Quit smoking 05/2011 but relapsed in 2014  ABGs show compensated hypercarbia.   > persistent dyspnea & hoarseness - advair/ breo was held, ENT eval neg  > added low-dose morphine after hospitalization 02/2014      Significant tests/ events  3/ 2013 PFTs - DLCO 50%, FEV1 1.68 - 64% (pre-op)  PFTs 07/2013  severe airway obstruction with FEV1 of 0.95-37% and DLCO of 38%  12/2012  PET positive Lt lower lung nodule increasing in size, underwent bronchoscopy 01/2013 and developed Lt PTX after procedure.  Pathology - showed atypical cells. BAL showed M.Cat -treated  Hyponatremia wu - urine lytes, cortisol, and Tsh were nml, HCTZ was stopped.  pelvic US.  - PET positive area on left ovary not seen     CT chest 07/2013 - 1.2 x 1.2 cm irregular left lower lobe pulmonary nodule continues to progress comparing back over multiple studies to 05/10/2011. 12/25/2012 -measured 8 x 10 mm.  PET 08/2013 Three intensely hypermetabolic lesions along the left upper mediastinum superior to the left hilum are concerning for carcinoma. Difficult to define soft tissue lesions to correspond these foci ? Periaortic LNs, ovarian process appears benign  01/2014 Seen by Dr. Valere Dross [facility] On[facility] &  Dr. Inda Merlin 09/2013 w/ plans to follow nodule serially - Case was discussed at thoracic oncology conference  08/2014 CT chest-slight increase in size of nodule in superior segment left lower lobe, abutting the mediastinum, compared to 03/2013  11/09/2014 Follow up : COPD/O2 depnendent/lung nodule Patient returns for a two-month  follow-up. Overall she feels that her breathing is about the same. He gets very short of breath with minimal  activity even at rest. She remains on Stiolto, Prednisone '40mg'$  daily . She remains on 2 L of oxygen She can also take as needed morphine for shortness of breath. Patient denies any increased cough, wheezing. She denies fever, chest pain, orthopnea, PND, or increased leg swelling. Patient was referred to vascular last visit due to feet swelling and coldness. Patient was seen by Dr. early on June 28. Vascular studies showed near normal blood flow of her feet bilaterally. She says it does improve some when she elevates her feet.   Review of Systems neg for any significant sore throat, dysphagia, itching, sneezing, nasal congestion or excess/ purulent secretions, fever, chills, sweats, unintended wt loss, pleuritic or exertional cp, hempoptysis, orthopnea pnd or change in chronic leg swelling. Also denies presyncope, palpitations, heartburn, abdominal pain, nausea, vomiting, diarrhea or change in bowel or urinary habits, dysuria,hematuria, rash, arthralgias, visual complaints, headache, numbness weakness or ataxia.     Objective:   Physical Exam  Gen. Pleasant, cachectic, in no distress, depressed affect ENT - no lesions, no post nasal drip Neck: No JVD, no thyromegaly, no carotid bruits Lungs: no use of accessory muscles, no dullness to percussion, decreased without rales,   Cardiovascular: Rhythm regular, heart sounds  normal, no murmurs or gallops, 1+ peripheral edema. VI changes , cool to touch  Abdomen: soft and non-tender, no hepatosplenomegaly, BS normal. Musculoskeletal: No deformities, purple discoloration of both feet up to shins, bilateral popliteal pulses palpable, no pulses in both feet Neuro:  alert, non  focal       Assessment & Plan:

## 2014-11-09 NOTE — Patient Instructions (Addendum)
Continue on Stioltio 2 puffs daily  Albuterol Neb every 4hrs as needed for wheezing /shortness of breath.  Taper Prednisone '20mg'$  daily  And hold at this dose.  Follow up Dr. Elsworth Soho  2 -3 months and As needed     Please contact office for sooner follow up if symptoms do not improve or worsen or seek emergency care

## 2014-11-09 NOTE — Progress Notes (Signed)
Reviewed & agree with plan  

## 2014-11-09 NOTE — Assessment & Plan Note (Signed)
Compensated on oxygen Continue on oxygen at 2 L to keep O2 saturation greater than 88-90%

## 2014-11-09 NOTE — Assessment & Plan Note (Signed)
Severe COPD, oxygen dependent With frequent exacerbations We'll try to taper prednisone down slowly and hold at 20 mg until seen back in office.  Plan  Continue on Stioltio 2 puffs daily  Albuterol Neb every 4hrs as needed for wheezing /shortness of breath.  Taper Prednisone '20mg'$  daily  And hold at this dose.  Follow up Dr. Elsworth Soho  2 -3 months and As needed     Please contact office for sooner follow up if symptoms do not improve or worsen or seek emergency care

## 2014-11-17 ENCOUNTER — Telehealth: Payer: Self-pay | Admitting: Pulmonary Disease

## 2014-11-17 NOTE — Telephone Encounter (Signed)
Spoke with pt She is c/o increased SOB and cough x 2 days  She is unable to cough up any sputum at all and feels that she may have pulled a muscle from coughing, chest feels sore  She is unable to come in for appt since she can not drive and has no one to bring her in  She is currently on 10 mg pred daily and is taking mucinex bid  Please advise recs thanks!

## 2014-11-17 NOTE — Telephone Encounter (Signed)
Called spoke with pt and made aware of recs. She reports she does not need the prednisone called in. She wants to hold off on the cough syrup right now and wants to try increasing the prednisone since she is low on money right now. Nothing further needed

## 2014-11-17 NOTE — Telephone Encounter (Signed)
Promethazine -codeine cough syrup 2.5- 53m twice daily as needed Increase pred to 20 mg daily x 7 days then back to 10 mg

## 2014-11-26 ENCOUNTER — Telehealth: Payer: Self-pay | Admitting: Pulmonary Disease

## 2014-11-26 MED ORDER — MORPHINE SULFATE 10 MG/5ML PO SOLN: 5.0000 mg | ORAL | Status: DC | PRN

## 2014-11-26 NOTE — Telephone Encounter (Signed)
That is fine 

## 2014-11-26 NOTE — Telephone Encounter (Signed)
Called and spoke to pt. Informed her the signed rx has been placed in out going mail today. Pt verbalized understanding and denied any further questions or concerns at this time.

## 2014-11-26 NOTE — Telephone Encounter (Signed)
Pt requesting refill on liquid Morphine.  Last refilled 10/21/14.  Please advise if ok to print rx.  Pt would like this mailed.

## 2014-11-29 ENCOUNTER — Other Ambulatory Visit: Payer: Self-pay | Admitting: Pulmonary Disease

## 2014-12-16 ENCOUNTER — Encounter (HOSPITAL_COMMUNITY): Payer: Self-pay | Admitting: *Deleted

## 2014-12-16 ENCOUNTER — Emergency Department (HOSPITAL_COMMUNITY): Payer: Medicare Other

## 2014-12-16 ENCOUNTER — Inpatient Hospital Stay (HOSPITAL_COMMUNITY)
Admission: EM | Admit: 2014-12-16 | Discharge: 2014-12-17 | DRG: 190 | Disposition: A | Discharge status: Hospice | Payer: Medicare Other | Attending: Internal Medicine | Admitting: Internal Medicine

## 2014-12-16 DIAGNOSIS — J441 Chronic obstructive pulmonary disease with (acute) exacerbation: Secondary | ICD-10-CM | POA: Diagnosis present

## 2014-12-16 DIAGNOSIS — R911 Solitary pulmonary nodule: Secondary | ICD-10-CM | POA: Diagnosis present

## 2014-12-16 DIAGNOSIS — I4891 Unspecified atrial fibrillation: Secondary | ICD-10-CM | POA: Diagnosis present

## 2014-12-16 DIAGNOSIS — Z515 Encounter for palliative care: Secondary | ICD-10-CM

## 2014-12-16 DIAGNOSIS — Z888 Allergy status to other drugs, medicaments and biological substances status: Secondary | ICD-10-CM | POA: Diagnosis not present

## 2014-12-16 DIAGNOSIS — L97909 Non-pressure chronic ulcer of unspecified part of unspecified lower leg with unspecified severity: Secondary | ICD-10-CM | POA: Diagnosis present

## 2014-12-16 DIAGNOSIS — M1990 Unspecified osteoarthritis, unspecified site: Secondary | ICD-10-CM | POA: Diagnosis present

## 2014-12-16 DIAGNOSIS — Z87891 Personal history of nicotine dependence: Secondary | ICD-10-CM | POA: Diagnosis not present

## 2014-12-16 DIAGNOSIS — Z79899 Other long term (current) drug therapy: Secondary | ICD-10-CM | POA: Diagnosis not present

## 2014-12-16 DIAGNOSIS — E538 Deficiency of other specified B group vitamins: Secondary | ICD-10-CM | POA: Diagnosis present

## 2014-12-16 DIAGNOSIS — Z789 Other specified health status: Secondary | ICD-10-CM | POA: Diagnosis not present

## 2014-12-16 DIAGNOSIS — G9340 Encephalopathy, unspecified: Secondary | ICD-10-CM | POA: Diagnosis present

## 2014-12-16 DIAGNOSIS — J9620 Acute and chronic respiratory failure, unspecified whether with hypoxia or hypercapnia: Secondary | ICD-10-CM | POA: Diagnosis present

## 2014-12-16 DIAGNOSIS — N838 Other noninflammatory disorders of ovary, fallopian tube and broad ligament: Secondary | ICD-10-CM | POA: Diagnosis present

## 2014-12-16 DIAGNOSIS — Z7952 Long term (current) use of systemic steroids: Secondary | ICD-10-CM | POA: Diagnosis not present

## 2014-12-16 DIAGNOSIS — K219 Gastro-esophageal reflux disease without esophagitis: Secondary | ICD-10-CM | POA: Diagnosis present

## 2014-12-16 DIAGNOSIS — Z885 Allergy status to narcotic agent status: Secondary | ICD-10-CM | POA: Diagnosis not present

## 2014-12-16 DIAGNOSIS — J9610 Chronic respiratory failure, unspecified whether with hypoxia or hypercapnia: Secondary | ICD-10-CM | POA: Diagnosis present

## 2014-12-16 DIAGNOSIS — I10 Essential (primary) hypertension: Secondary | ICD-10-CM | POA: Diagnosis present

## 2014-12-16 DIAGNOSIS — J9622 Acute and chronic respiratory failure with hypercapnia: Secondary | ICD-10-CM | POA: Diagnosis present

## 2014-12-16 DIAGNOSIS — Z66 Do not resuscitate: Secondary | ICD-10-CM | POA: Diagnosis present

## 2014-12-16 DIAGNOSIS — Z7982 Long term (current) use of aspirin: Secondary | ICD-10-CM | POA: Diagnosis not present

## 2014-12-16 DIAGNOSIS — N839 Noninflammatory disorder of ovary, fallopian tube and broad ligament, unspecified: Secondary | ICD-10-CM | POA: Diagnosis present

## 2014-12-16 DIAGNOSIS — K5730 Diverticulosis of large intestine without perforation or abscess without bleeding: Secondary | ICD-10-CM | POA: Diagnosis present

## 2014-12-16 DIAGNOSIS — J449 Chronic obstructive pulmonary disease, unspecified: Secondary | ICD-10-CM | POA: Diagnosis present

## 2014-12-16 DIAGNOSIS — L89151 Pressure ulcer of sacral region, stage 1: Secondary | ICD-10-CM | POA: Diagnosis present

## 2014-12-16 DIAGNOSIS — J9621 Acute and chronic respiratory failure with hypoxia: Secondary | ICD-10-CM | POA: Insufficient documentation

## 2014-12-16 DIAGNOSIS — Z7902 Long term (current) use of antithrombotics/antiplatelets: Secondary | ICD-10-CM | POA: Diagnosis not present

## 2014-12-16 DIAGNOSIS — J9600 Acute respiratory failure, unspecified whether with hypoxia or hypercapnia: Secondary | ICD-10-CM | POA: Diagnosis present

## 2014-12-16 DIAGNOSIS — Z886 Allergy status to analgesic agent status: Secondary | ICD-10-CM | POA: Diagnosis not present

## 2014-12-16 LAB — URINALYSIS, ROUTINE W REFLEX MICROSCOPIC
BILIRUBIN URINE: NEGATIVE
Glucose, UA: NEGATIVE mg/dL
Ketones, ur: NEGATIVE mg/dL
Leukocytes, UA: NEGATIVE
NITRITE: NEGATIVE
PROTEIN: NEGATIVE mg/dL
SPECIFIC GRAVITY, URINE: 1.014 (ref 1.005–1.030)
UROBILINOGEN UA: 1 mg/dL (ref 0.0–1.0)
pH: 5.5 (ref 5.0–8.0)

## 2014-12-16 LAB — CBC WITH DIFFERENTIAL/PLATELET
Basophils Absolute: 0 10*3/uL (ref 0.0–0.1)
Basophils Relative: 0 %
EOS PCT: 0 %
Eosinophils Absolute: 0 10*3/uL (ref 0.0–0.7)
HCT: 45.4 % (ref 36.0–46.0)
Hemoglobin: 14.3 g/dL (ref 12.0–15.0)
LYMPHS ABS: 0.8 10*3/uL (ref 0.7–4.0)
LYMPHS PCT: 6 %
MCH: 31.8 pg (ref 26.0–34.0)
MCHC: 31.5 g/dL (ref 30.0–36.0)
MCV: 101.1 fL — AB (ref 78.0–100.0)
MONO ABS: 0.8 10*3/uL (ref 0.1–1.0)
Monocytes Relative: 7 %
Neutro Abs: 10.8 10*3/uL — ABNORMAL HIGH (ref 1.7–7.7)
Neutrophils Relative %: 87 %
PLATELETS: 122 10*3/uL — AB (ref 150–400)
RBC: 4.49 MIL/uL (ref 3.87–5.11)
RDW: 14.3 % (ref 11.5–15.5)
WBC: 12.4 10*3/uL — ABNORMAL HIGH (ref 4.0–10.5)

## 2014-12-16 LAB — I-STAT TROPONIN, ED
TROPONIN I, POC: 0 ng/mL (ref 0.00–0.08)
TROPONIN I, POC: 0 ng/mL (ref 0.00–0.08)

## 2014-12-16 LAB — BLOOD GAS, VENOUS
Acid-Base Excess: 16.1 mmol/L — ABNORMAL HIGH (ref 0.0–2.0)
Bicarbonate: 50.8 mEq/L — ABNORMAL HIGH (ref 20.0–24.0)
O2 SAT: 30 %
PH VEN: 7.209 — AB (ref 7.250–7.300)
PO2 VEN: 23.1 mmHg — AB (ref 30.0–45.0)
Patient temperature: 98.6
TCO2: 47.7 mmol/L (ref 0–100)
pCO2, Ven: 132 mmHg (ref 45.0–50.0)

## 2014-12-16 LAB — COMPREHENSIVE METABOLIC PANEL
ALBUMIN: 3.4 g/dL — AB (ref 3.5–5.0)
ALK PHOS: 84 U/L (ref 38–126)
ALT: 16 U/L (ref 14–54)
ANION GAP: 7 (ref 5–15)
AST: 27 U/L (ref 15–41)
BUN: 24 mg/dL — ABNORMAL HIGH (ref 6–20)
CALCIUM: 9.9 mg/dL (ref 8.9–10.3)
CHLORIDE: 87 mmol/L — AB (ref 101–111)
CO2: 47 mmol/L — AB (ref 22–32)
Creatinine, Ser: 0.86 mg/dL (ref 0.44–1.00)
GFR calc non Af Amer: >60 mL/min (ref >60)
GLUCOSE: 138 mg/dL — AB (ref 65–99)
POTASSIUM: 4.2 mmol/L (ref 3.5–5.1)
SODIUM: 141 mmol/L (ref 135–145)
Total Bilirubin: 1 mg/dL (ref 0.3–1.2)
Total Protein: 6.7 g/dL (ref 6.5–8.1)

## 2014-12-16 LAB — ACETAMINOPHEN LEVEL

## 2014-12-16 LAB — I-STAT CG4 LACTIC ACID, ED
Lactic Acid, Venous: 0.97 mmol/L (ref 0.5–2.0)
Lactic Acid, Venous: 2.32 mmol/L (ref 0.5–2.0)

## 2014-12-16 LAB — URINE MICROSCOPIC-ADD ON

## 2014-12-16 LAB — MRSA PCR SCREENING: MRSA by PCR: NEGATIVE

## 2014-12-16 LAB — BRAIN NATRIURETIC PEPTIDE: B Natriuretic Peptide: 206.9 pg/mL — ABNORMAL HIGH (ref 0.0–100.0)

## 2014-12-16 LAB — CBG MONITORING, ED: Glucose-Capillary: 97 mg/dL (ref 65–99)

## 2014-12-16 LAB — SALICYLATE LEVEL

## 2014-12-16 MED ORDER — SODIUM CHLORIDE 0.9 % IV SOLN
Freq: Once | INTRAVENOUS | Status: AC
  Administered 2014-12-16: 14:00:00 via INTRAVENOUS

## 2014-12-16 MED ORDER — IPRATROPIUM-ALBUTEROL 0.5-2.5 (3) MG/3ML IN SOLN
3.0000 mL | RESPIRATORY_TRACT | Status: DC
Start: 2014-12-16 — End: 2014-12-17
  Administered 2014-12-16 – 2014-12-17 (×6): 3 mL via RESPIRATORY_TRACT
  Filled 2014-12-16 (×4): qty 3

## 2014-12-16 MED ORDER — PANTOPRAZOLE SODIUM 40 MG PO TBEC: 40.0000 mg | DELAYED_RELEASE_TABLET | Freq: Every day | ORAL | Status: DC

## 2014-12-16 MED ORDER — VERAPAMIL HCL ER 180 MG PO TBCR
180.0000 mg | EXTENDED_RELEASE_TABLET | Freq: Every day | ORAL | Status: DC
  Filled 2014-12-16: qty 1

## 2014-12-16 MED ORDER — ALPRAZOLAM 0.5 MG PO TABS
0.5000 mg | ORAL_TABLET | Freq: Three times a day (TID) | ORAL | Status: DC | PRN
  Administered 2014-12-16: 0.5 mg via ORAL
  Filled 2014-12-16: qty 1

## 2014-12-16 MED ORDER — ENOXAPARIN SODIUM 30 MG/0.3ML ~~LOC~~ SOLN: 30.0000 mg | SUBCUTANEOUS | Status: DC

## 2014-12-16 MED ORDER — MORPHINE SULFATE (PF) 2 MG/ML IV SOLN: 0.5000 mg | INTRAVENOUS | Status: DC | PRN

## 2014-12-16 MED ORDER — POLYETHYLENE GLYCOL 3350 17 G PO PACK: 17.0000 g | PACK | ORAL | Status: DC

## 2014-12-16 MED ORDER — IPRATROPIUM-ALBUTEROL 0.5-2.5 (3) MG/3ML IN SOLN: 3.0000 mL | Freq: Four times a day (QID) | RESPIRATORY_TRACT | Status: DC

## 2014-12-16 MED ORDER — ACETAMINOPHEN 325 MG PO TABS: 650.0000 mg | ORAL_TABLET | Freq: Four times a day (QID) | ORAL | Status: DC | PRN

## 2014-12-16 MED ORDER — SODIUM CHLORIDE 0.9 % IV SOLN: 250.0000 mL | INTRAVENOUS | Status: DC | PRN

## 2014-12-16 MED ORDER — TIOTROPIUM BROMIDE-OLODATEROL 2.5-2.5 MCG/ACT IN AERS: 2.5000 ug | INHALATION_SPRAY | Freq: Every day | RESPIRATORY_TRACT | Status: DC

## 2014-12-16 MED ORDER — SODIUM CHLORIDE 0.9 % IV BOLUS (SEPSIS)
1000.0000 mL | Freq: Once | INTRAVENOUS | Status: AC
  Administered 2014-12-16: 1000 mL via INTRAVENOUS

## 2014-12-16 MED ORDER — CARVEDILOL 3.125 MG PO TABS: 3.1250 mg | ORAL_TABLET | Freq: Every day | ORAL | Status: DC

## 2014-12-16 MED ORDER — MORPHINE SULFATE (PF) 2 MG/ML IV SOLN
2.0000 mg | INTRAVENOUS | Status: DC | PRN
  Administered 2014-12-17 (×5): 2 mg via INTRAVENOUS
  Filled 2014-12-16 (×5): qty 1

## 2014-12-16 MED ORDER — CLOPIDOGREL BISULFATE 75 MG PO TABS: 75.0000 mg | ORAL_TABLET | Freq: Every day | ORAL | Status: DC

## 2014-12-16 MED ORDER — FLUTICASONE PROPIONATE 50 MCG/ACT NA SUSP: 2.0000 | NASAL | Status: DC | PRN

## 2014-12-16 MED ORDER — MORPHINE SULFATE 10 MG/5ML PO SOLN: 5.0000 mg | ORAL | Status: DC | PRN

## 2014-12-16 MED ORDER — SODIUM CHLORIDE 0.9 % IJ SOLN: 3.0000 mL | INTRAMUSCULAR | Status: DC | PRN

## 2014-12-16 MED ORDER — LORATADINE 10 MG PO TABS
10.0000 mg | ORAL_TABLET | Freq: Every day | ORAL | Status: DC
Start: 2014-12-16 — End: 2014-12-16

## 2014-12-16 MED ORDER — LEVOFLOXACIN IN D5W 500 MG/100ML IV SOLN
500.0000 mg | INTRAVENOUS | Status: DC
  Administered 2014-12-16: 500 mg via INTRAVENOUS
  Filled 2014-12-16: qty 100

## 2014-12-16 MED ORDER — LORAZEPAM 2 MG/ML IJ SOLN
0.5000 mg | INTRAMUSCULAR | Status: DC | PRN
  Administered 2014-12-17 (×3): 0.5 mg via INTRAVENOUS
  Filled 2014-12-16 (×3): qty 1

## 2014-12-16 MED ORDER — ACETAMINOPHEN 650 MG RE SUPP: 650.0000 mg | Freq: Four times a day (QID) | RECTAL | Status: DC | PRN

## 2014-12-16 MED ORDER — ONDANSETRON HCL 4 MG/2ML IJ SOLN: 4.0000 mg | Freq: Four times a day (QID) | INTRAMUSCULAR | Status: DC | PRN

## 2014-12-16 MED ORDER — ALBUTEROL SULFATE (2.5 MG/3ML) 0.083% IN NEBU: 2.5000 mg | INHALATION_SOLUTION | Freq: Four times a day (QID) | RESPIRATORY_TRACT | Status: DC

## 2014-12-16 MED ORDER — SODIUM CHLORIDE 0.9 % IJ SOLN
3.0000 mL | Freq: Two times a day (BID) | INTRAMUSCULAR | Status: DC
  Administered 2014-12-16 – 2014-12-17 (×2): 3 mL via INTRAVENOUS

## 2014-12-16 MED ORDER — ONDANSETRON HCL 4 MG PO TABS: 4.0000 mg | ORAL_TABLET | Freq: Four times a day (QID) | ORAL | Status: DC | PRN

## 2014-12-16 MED ORDER — METHYLPREDNISOLONE SODIUM SUCC 125 MG IJ SOLR
60.0000 mg | Freq: Three times a day (TID) | INTRAMUSCULAR | Status: DC
  Administered 2014-12-17 (×2): 60 mg via INTRAVENOUS
  Filled 2014-12-16 (×2): qty 2

## 2014-12-16 MED ORDER — METHYLPREDNISOLONE SODIUM SUCC 125 MG IJ SOLR
125.0000 mg | Freq: Once | INTRAMUSCULAR | Status: AC
  Administered 2014-12-16: 125 mg via INTRAVENOUS
  Filled 2014-12-16: qty 2

## 2014-12-16 MED ORDER — IPRATROPIUM-ALBUTEROL 0.5-2.5 (3) MG/3ML IN SOLN
3.0000 mL | Freq: Once | RESPIRATORY_TRACT | Status: AC
  Administered 2014-12-16: 3 mL via RESPIRATORY_TRACT
  Filled 2014-12-16: qty 3

## 2014-12-16 MED ORDER — IPRATROPIUM-ALBUTEROL 0.5-2.5 (3) MG/3ML IN SOLN
RESPIRATORY_TRACT | Status: AC
  Filled 2014-12-16: qty 3

## 2014-12-16 MED ORDER — GUAIFENESIN ER 600 MG PO TB12: 600.0000 mg | ORAL_TABLET | Freq: Two times a day (BID) | ORAL | Status: DC

## 2014-12-16 MED ORDER — IPRATROPIUM BROMIDE 0.02 % IN SOLN: 0.5000 mg | Freq: Four times a day (QID) | RESPIRATORY_TRACT | Status: DC

## 2014-12-16 MED ORDER — ALBUTEROL SULFATE (2.5 MG/3ML) 0.083% IN NEBU: 2.5000 mg | INHALATION_SOLUTION | RESPIRATORY_TRACT | Status: DC | PRN

## 2014-12-16 MED ORDER — MORPHINE SULFATE (PF) 2 MG/ML IV SOLN
2.0000 mg | INTRAVENOUS | Status: DC | PRN
  Administered 2014-12-16: 2 mg via INTRAVENOUS
  Filled 2014-12-16: qty 1

## 2014-12-16 MED ORDER — CARVEDILOL 3.125 MG PO TABS
1.5600 mg | ORAL_TABLET | Freq: Every day | ORAL | Status: DC
  Administered 2014-12-17: 1.56 mg via ORAL
  Filled 2014-12-16: qty 1

## 2014-12-16 NOTE — Consult Note (Addendum)
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 Decision was made over a year ago to not biopsy or intervene for the lung mass that was positive on PET scan 08/2013. She has been talking about dying to her family, recent falls, poor appetite, confusion, increasingly dependent on others for her care. Her granddaughter moved in with her recently because she was unsafe alone. 6 months ago she was hospitalized and a hospice referral was made but she was not quite ready for services at that point ( [place]).   Urgent Palliative consult was called because this patient and daughter had previous conversations about her desire to die at home with peace and dignity. She has an independent and self-reliant personality. Her daughter [name] promised to not let her mother die in a hospital. They are asking for transport home with hospice ASAP. Concerns are that she may not survive transport but family willing to take this risk.  Family are all committed to comfort care - she has three daughters. Renee is HCPOA, she is an Therapist, sports at [employer] and has good insight into this disease progression.  Contacts/Participants in Discussion: 3 daughters, brother, sister, friends Primary Decision Maker: Renee Relationship to Patient daughter  HCPOA: yes    Code Status/Advance Care Planning: DNR    Code Status Orders        Start     Ordered   12/16/14 1657  Do not attempt resuscitation (DNR)   Continuous    Question Answer Comment  In the event of cardiac or respiratory ARREST Do not call a "code blue"   In the event of cardiac or respiratory ARREST Do not perform Intubation, CPR, defibrillation or ACLS   In the event of cardiac or respiratory ARREST Use medication by any route, position, wound care, and other measures to relive pain and suffering. May use oxygen, suction and manual treatment of airway obstruction as needed for comfort.      12/16/14 1656    Advance Directive Documentation        Most Recent Value   Type of Advance Directive  Healthcare Power of Fritz Pickerel (daughter) HCPOA]   Pre-existing out of facility DNR order (yellow form or pink MOST form)     "MOST" Form in Place?       Other Directives:MOST Form  Symptom Management:  1. Pain: Pain Score: 8  (12/16/14 1700) 2. Dyspnea:  Variable  0 Points 1 Point 2 Points X Total  Heart rate per minute  <90 beats 90-109 beats >110 beatsX   Respiratory  Rate per minute < 18 breaths 19-30 breaths X  >30 breaths   Restlessness; nonpurposeful movements None  occas slight movementX Frequent movement   Paradoxical breathing pattern: None  Present   Accessory muscle use: rise in clavicle during inspiration None Slight rise Pronounced rise   Grunting at end-expiration: guttural sound None  Present  Nasal flaring: involuntary movement of nares None  Present   Look of fear None  Eyes wide   Overall total out of 16 12       Start morphine PRN '2mg'$  q1 PRN dyspnea, Pain-->transition to Roxanol on discharge  IV Lorazepam 0.5q2 PRN-->change to SL intensol on discharge  Palliative Prophylaxis: Aspiration, Bowel Regimen, Delirium Protocol, Eye Care, Frequent Pain Assessment, Oral Care, Palliative Wound Care and Turn Reposition  Additional Recommendations (Limitations, Scope, Preferences): Avoid Hospitalization, Full Comfort Care, Minimize Medications, Initiate Comfort  Feeding, No Chemotherapy, No Diagnostics, No Glucose Monitoring, No IV Antibiotics and No Lab Draws  Psycho-social/Spiritual:  Support System: Strong Desire for further Chaplaincy support:yes Additional Recommendations: Caregiving  Support/Resources, Education on Hospice and Grief/Bereavement Support  Prognosis: < 2 weeks  Discharge Planning: Home with Hospice In preparation for rapid discharge in AM , I have already placed hospice DME orders, DNR is on chart, discharge med reconciliation has been completed including recommended hospice medications.  Chief Complaint/ Primary Diagnoses: Present on Admission:  . COPD (chronic obstructive pulmonary disease) . Chronic respiratory failure . COPD with acute exacerbation . (Resolved) Acute on chronic respiratory failure . Ovarian mass: PET SCAN POSITIVE  . Acute respiratory failure  I have reviewed the medical record, interviewed the patient and family, and examined the patient. The following aspects are pertinent.  Past Medical History  Diagnosis Date  . COPD (chronic obstructive pulmonary disease)   . HTN (hypertension)   . B12 deficiency   . OA (osteoarthritis)   . Hypokalemia   . Mass of lung   . GERD (gastroesophageal reflux disease)   . Diverticulosis of colon (without mention of hemorrhage)   . 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 Pulmonary nodule   . Atrial fibrillation    Social History   Social History  . Marital Status: Widowed    Spouse Name: N/A  . Number of Children: 3  . Years of Education: N/A   Occupational History  . RETIRED    Social History Main Topics  . Smoking status: Former Smoker -- 1.00 packs/day for 40 years    Types: Cigarettes    Quit date: 05/01/2012  . Smokeless tobacco: Never Used     Comment: resumed for a short time  . Alcohol Use: No  . Drug Use: No  . Sexual Activity: No   Other Topics Concern  . None   Social History Narrative   Lives alone.   Daily caffeine     Family History  Problem Relation Age of Onset  . Coronary artery disease Mother 50  . Alzheimer's disease Mother   . Colon cancer    . COPD Father    Scheduled Meds: . [START ON 01-08-15] carvedilol  1.56 mg Oral Daily  . ipratropium-albuterol  3 mL Nebulization Q4H  . ipratropium-albuterol      . levofloxacin (LEVAQUIN) IV  500 mg Intravenous Q24H  . methylPREDNISolone (SOLU-MEDROL) injection  60 mg Intravenous 3 times per day  . polyethylene glycol  17 g Oral QODAY  . sodium chloride  3 mL Intravenous Q12H  . [START ON 08-Jan-2015] Tiotropium Bromide-Olodaterol  2.5 mcg Inhalation Daily  . verapamil  180 mg Oral QHS   Continuous Infusions:  PRN Meds:.sodium chloride, acetaminophen **OR** acetaminophen, albuterol, LORazepam, morphine injection, ondansetron **OR** ondansetron (ZOFRAN) IV, sodium chloride Medications Prior to Admission:  Prior to Admission medications   Medication Sig Start Date End Date Taking? Authorizing Provider  albuterol (PROVENTIL) (2.5 MG/3ML)  0.083% nebulizer solution Take 2.5 mg by nebulization 4 (four) times daily. For shortness of breath 02/08/12  Yes Shanker Kristeen Mans, MD  ALPRAZolam Duanne Moron) 0.25 MG tablet Take 0.25 mg by mouth 2 (two) times daily. 07/21/13  Yes Rigoberto Noel, MD  amLODipine (NORVASC) 5 MG tablet Take 5 mg by mouth daily.  11/25/14  Yes Historical Provider, MD  aspirin 81 MG chewable tablet Chew 1 tablet (81 mg total) by mouth daily. 07/06/14  Yes Tasrif Ahmed, MD  carvedilol (COREG) 3.125 MG tablet Take 1.56 mg by mouth 2 (two) times daily with a meal. 1/2 tab twice a day 06/29/14  Yes Historical Provider, MD  esomeprazole (NEXIUM) 20 MG capsule Take 20 mg by mouth daily at 12 noon.   Yes Historical Provider, MD  furosemide (LASIX) 20 MG tablet Take 1 tablet (20 mg total) by mouth daily. 07/19/14  Yes Rigoberto Noel, MD  guaiFENesin (MUCINEX) 600 MG 12 hr tablet Take 1 tablet (600 mg total) by mouth 2 (two) times daily as needed. Patient taking  differently: Take 600 mg by mouth 2 (two) times daily.  03/18/14  Yes Rushil Sherrye Payor, MD  ipratropium (ATROVENT) 0.02 % nebulizer solution Take 0.5 mg by nebulization 4 (four) times daily.   Yes Historical Provider, MD  KLOR-CON M20 20 MEQ tablet Take 20 mEq by mouth daily.  05/11/11  Yes Historical Provider, MD  loratadine (CLARITIN) 10 MG tablet Take 1 tablet (10 mg total) by mouth daily. 02/08/12  Yes Shanker Kristeen Mans, MD  morphine 10 MG/5ML solution Take 2.5 mLs (5 mg total) by mouth every 4 (four) hours as needed (shortness of breath). 11/26/14  Yes Tammy S Parrett, NP  polyethylene glycol (MIRALAX / GLYCOLAX) packet Take 17 g by mouth every other day.   Yes Historical Provider, MD  predniSONE (DELTASONE) 10 MG tablet 1-2 daily as directed Patient taking differently: Take 20 mg by mouth daily with breakfast.  08/05/14  Yes Tammy S Parrett, NP  PROAIR RESPICLICK 053 (90 BASE) MCG/ACT AEPB INHALE TWO PUFFS EVERY 4 HOURS AS NEEDED 09/15/14  Yes Rigoberto Noel, MD  Tiotropium Bromide-Olodaterol (STIOLTO RESPIMAT) 2.5-2.5 MCG/ACT AERS Inhale 2.5 mcg into the lungs daily. Inhale 2 puffs once daily 06/02/14  Yes Rigoberto Noel, MD  verapamil (CALAN-SR) 180 MG CR tablet Take 180 mg by mouth at bedtime.    Yes Historical Provider, MD  acetaminophen (TYLENOL) 500 MG tablet Take 500 mg by mouth every 6 (six) hours as needed for moderate pain.     Historical Provider, MD  cyanocobalamin (,VITAMIN B-12,) 1000 MCG/ML injection Inject 1,000 mcg into the muscle every 30 (thirty) days. Given on the 10th of each month    Historical Provider, MD  Dentifrices (BIOTENE DRY MOUTH) GEL Place 1 application onto teeth 2 (two) times daily as needed (dry mouth).    Historical Provider, MD  fluticasone (FLONASE) 50 MCG/ACT nasal spray Place 2 sprays into both nostrils as needed for allergies.  02/13/13   Elsie Stain, MD   Allergies  Allergen Reactions  . Aspirin Swelling    Lips and face swelled.  [name]  [Oxycodone-Aspirin] Other (See Comments)    "went out of my head"  . Advair Diskus [Fluticasone-Salmeterol] Other (See Comments)    laryngitis   CBC:    Component Value Date/Time   WBC 12.4* 12/16/2014 1154   WBC 7.5 09/21/2013 1333   HGB 14.3 12/16/2014 1154   HGB 13.9 09/21/2013 1333  HCT 45.4 12/16/2014 1154   HCT 41.6 09/21/2013 1333   PLT 122* 12/16/2014 1154   PLT 119 Large platelets present* 09/21/2013 1333   MCV 101.1* 12/16/2014 1154   MCV 90.0 09/21/2013 1333   NEUTROABS 10.8* 12/16/2014 1154   NEUTROABS 4.7 09/21/2013 1333   LYMPHSABS 0.8 12/16/2014 1154   LYMPHSABS 1.3 09/21/2013 1333   MONOABS 0.8 12/16/2014 1154   MONOABS 0.6 09/21/2013 1333   EOSABS 0.0 12/16/2014 1154   EOSABS 0.7* 09/21/2013 1333   BASOSABS 0.0 12/16/2014 1154   BASOSABS 0.0 09/21/2013 1333   Comprehensive Metabolic Panel:    Component Value Date/Time   NA 141 12/16/2014 1236   NA 140 09/21/2013 1333   K 4.2 12/16/2014 1236   K 3.4* 09/21/2013 1333   CL 87* 12/16/2014 1236   CO2 47* 12/16/2014 1236   CO2 30* 09/21/2013 1333   BUN 24* 12/16/2014 1236   BUN 12.6 09/21/2013 1333   CREATININE 0.86 12/16/2014 1236   CREATININE 1.0 09/21/2013 1333   GLUCOSE 138* 12/16/2014 1236   GLUCOSE 90 09/21/2013 1333   CALCIUM 9.9 12/16/2014 1236   CALCIUM 10.1 09/21/2013 1333   AST 27 12/16/2014 1236   AST 19 09/21/2013 1333   ALT 16 12/16/2014 1236   ALT 13 09/21/2013 1333   ALKPHOS 84 12/16/2014 1236   ALKPHOS 72 09/21/2013 1333   BILITOT 1.0 12/16/2014 1236   BILITOT 0.36 09/21/2013 1333   PROT 6.7 12/16/2014 1236   PROT 7.0 09/21/2013 1333   ALBUMIN 3.4* 12/16/2014 1236   ALBUMIN 3.6 09/21/2013 1333    Review of Systems  Constitutional: Positive for diaphoresis, activity change, appetite change and fatigue.  HENT: Positive for trouble swallowing.   Respiratory: Positive for cough and shortness of breath.   Cardiovascular: Positive for leg swelling.  Gastrointestinal: Positive  for constipation.  Endocrine: Positive for cold intolerance.  Genitourinary: Positive for urgency.  Musculoskeletal: Positive for arthralgias.  Skin: Positive for pallor.  Neurological: Positive for dizziness.  Psychiatric/Behavioral: Positive for confusion. The patient is nervous/anxious.     Physical Exam  Constitutional: She appears distressed.  HENT:  Head: Normocephalic.  Mouth/Throat: No oropharyngeal exudate.  Cardiovascular:  Murmur heard. Respiratory: Tachypnea noted. She is in respiratory distress.  GI: She exhibits distension.  Musculoskeletal: She exhibits edema.       Right ankle: She exhibits deformity.       Left ankle: She exhibits deformity.  Skin: Ecchymosis and laceration noted. There is erythema. Nails show clubbing.  Psychiatric: She is slowed. Cognition and memory are impaired.    Vital Signs: BP 157/104 mmHg  Pulse 101  Temp(Src) 98.1 F (36.7 C) (Oral)  Resp 19  Ht '5\' 5"'$  (1.651 m)  Wt 47.6 kg (104 lb 15 oz)  BMI 17.46 kg/m2  SpO2 94% SpO2:    O2 Device:SpO2: 94 % O2 Flow Rate: .O2 Flow Rate (L/min): 6 L/min Intake/output summary:  Intake/Output Summary (Last 24 hours) at 12/16/14 2342 Last data filed at 12/16/14 1800  Gross per 24 hour  Intake   1020 ml  Output    250 ml  Net    770 ml   LBM:  BMP Latest Ref Rng 12/16/2014 07/05/2014 07/04/2014  Glucose 65 - 99 mg/dL 138(H) 149(H) 81  BUN 6 - 20 mg/dL 24(H) 22 21  Creatinine 0.44 - 1.00 mg/dL 0.86 1.08 1.05  Sodium 135 - 145 mmol/L 141 139 138  Potassium 3.5 - 5.1 mmol/L 4.2 4.1 4.1  Chloride 101 -  111 mmol/L 87(L) 94(L) 93(L)  CO2 22 - 32 mmol/L 47(H) 34(H) 33(H)  Calcium 8.9 - 10.3 mg/dL 9.9 8.8 9.0    Baseline Weight: Weight: 47.6 kg (104 lb 15 oz) Most recent weight: Weight: 47.6 kg (104 lb 15 oz)       Palliative Performance Scale: 20       Additional Data Reviewed: Recent Labs     12/16/14  1154  12/16/14  1236  WBC  12.4*   --   HGB  14.3   --   PLT  122*   --   NA    --   141  BUN   --   24*  CREATININE   --   0.86    Time In: 10PM  Time Out: 1150PM Time Total: 100  Greater than 50%  of this time was spent counseling and coordinating care related to the above assessment and plan.  Signed by: Roma Schanz, DO  12/16/2014, 11:42 PM  Please contact Palliative Medicine Team phone at [phone] for questions and concerns.

## 2014-12-16 NOTE — ED Notes (Signed)
Report given to Stephanie, RN.

## 2014-12-16 NOTE — ED Provider Notes (Signed)
CSN: [id number]     Arrival date & time 12/16/14  1123 History   First MD Initiated Contact with Patient 12/16/14 1145     Chief Complaint  Patient presents with  . Altered Mental Status     (Consider location/radiation/quality/duration/timing/severity/associated sxs/prior Treatment) Patient is a 69 y.o. female presenting with altered mental status.  Altered Mental Status Presenting symptoms: lethargy and partial responsiveness   Severity:  Severe Most recent episode:  Today Episode history:  Single Timing:  Constant Progression:  Unchanged Chronicity:  New Context comment:  Severe COPD Associated symptoms: difficulty breathing and weakness (generalized)   Associated symptoms: no abdominal pain, no fever and no vomiting     Past Medical History  Diagnosis Date  . COPD (chronic obstructive pulmonary disease)   . HTN (hypertension)   . B12 deficiency   . OA (osteoarthritis)   . Hypokalemia   . Mass of lung   . GERD (gastroesophageal reflux disease)   . Diverticulosis of colon (without mention of hemorrhage)   . 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 Pulmonary nodule   . Atrial fibrillation    Past Surgical History  Procedure Laterality Date  . Carpal tunnel release      bilateral  . Tubal ligation      x 2  . Cervical spine surgery      plate with screws  . Lung surgery Right 07/06/11    RUL, per patient Dr Roxan Hockey  . Video bronchoscopy Bilateral 01/20/2013    Procedure: VIDEO BRONCHOSCOPY WITH FLUORO;  Surgeon: Rigoberto Noel, MD;  Location: [facility];  Service: Cardiopulmonary;  Laterality: Bilateral;  . Cataract extraction w/phaco Left 11/30/2013    Procedure: CATARACT EXTRACTION PHACO AND INTRAOCULAR LENS PLACEMENT (IOC);  Surgeon: Tonny Branch, MD;  Location: [facility];  Service: Ophthalmology;  Laterality: Left;  CDE:  11.35  . Cataract surgery Left 2010   Family History  Problem Relation Age of Onset  . Coronary artery disease Mother 75  .  Alzheimer's disease Mother   . Colon cancer    . COPD Father    Social History  Substance Use Topics  . Smoking status: Former Smoker -- 1.00 packs/day for 40 years    Types: Cigarettes    Quit date: 05/01/2012  . Smokeless tobacco: Never Used     Comment: resumed for a short time  . Alcohol Use: No   OB History    No data available     Review of Systems  Unable to perform ROS: Mental status change  Constitutional: Negative for fever.  Gastrointestinal: Negative for vomiting and abdominal pain.  Neurological: Positive for weakness (generalized).      Allergies  Aspirin; Percodan; and Advair diskus  Home Medications   Prior to Admission medications   Medication Sig Start Date End Date Taking? Authorizing Provider  albuterol (PROVENTIL) (2.5 MG/3ML) 0.083% nebulizer solution Take 2.5 mg by nebulization 4 (four) times daily. For shortness of breath 02/08/12  Yes Shanker Kristeen Mans, MD  aspirin 81 MG chewable tablet Chew 1 tablet (81 mg total) by mouth daily. 07/06/14  Yes Tasrif Ahmed, MD  clopidogrel (PLAVIX) 75 MG tablet Take 75 mg by mouth daily.   Yes Historical Provider, MD  esomeprazole (NEXIUM) 20 MG capsule Take 20 mg by mouth daily at 12 noon.   Yes Historical Provider, MD  furosemide (LASIX) 20 MG tablet Take 1 tablet (20 mg total) by mouth daily. 07/19/14  Yes Rigoberto Noel, MD  guaiFENesin (MUCINEX) 600 MG 12 hr tablet Take 1 tablet (600 mg total) by mouth 2 (two) times daily as needed. Patient taking differently: Take 600 mg by mouth 2 (two) times daily.  03/18/14  Yes Rushil Sherrye Payor, MD  ipratropium (ATROVENT) 0.02 % nebulizer solution Take 0.5 mg by nebulization 4 (four) times daily.   Yes Historical Provider, MD  morphine 10 MG/5ML solution Take 2.5 mLs (5 mg total) by mouth every 4 (four) hours as needed (shortness of breath). 11/26/14  Yes Tammy S Parrett, NP  PROAIR RESPICLICK 852 (90 BASE) MCG/ACT AEPB INHALE TWO PUFFS EVERY 4 HOURS AS NEEDED 09/15/14  Yes Rigoberto Noel, MD  acetaminophen (TYLENOL) 500 MG tablet Take 500 mg by mouth every 6 (six) hours as needed for moderate pain.     Historical Provider, MD  ALPRAZolam Duanne Moron) 0.25 MG tablet Take 0.25 mg by mouth 2 (two) times daily. 07/21/13   Rigoberto Noel, MD  carvedilol (COREG) 3.125 MG tablet Take 3.125 mg by mouth daily. 1/2 tab twice a day 06/29/14   Historical Provider, MD  Clopidogrel Bisulfate (PLAVIX PO) Take by mouth daily.    Historical Provider, MD  cyanocobalamin (,VITAMIN B-12,) 1000 MCG/ML injection Inject 1,000 mcg into the muscle every 30 (thirty) days. Given on the 10th of each month    Historical Provider, MD  Dentifrices (BIOTENE DRY MOUTH) GEL Place 1 application onto teeth 2 (two) times daily as needed (dry mouth).    Historical Provider, MD  fluticasone (FLONASE) 50 MCG/ACT nasal spray Place 2 sprays into both nostrils as needed for allergies.  02/13/13   Elsie Stain, MD  KLOR-CON M20 20 MEQ tablet Take 20 mEq by mouth daily.  05/11/11   Historical Provider, MD  loratadine (CLARITIN) 10 MG tablet Take 1 tablet (10 mg total) by mouth daily. 02/08/12   Shanker Kristeen Mans, MD  polyethylene glycol (MIRALAX / GLYCOLAX) packet Take 17 g by mouth every other day.    Historical Provider, MD  predniSONE (DELTASONE) 10 MG tablet 1-2 daily as directed 08/05/14   Tammy S Parrett, NP  predniSONE (DELTASONE) 10 MG tablet Take '40mg'$  daily with food x 7 days, then '30mg'$  x 7 days, then '20mg'$  daily to continue. Patient not taking: Reported on 11/09/2014 09/07/14   Rigoberto Noel, MD  predniSONE (DELTASONE) 10 MG tablet TAKE TWO TABLETS BY MOUTH WITH FOOD FOR 7 DAYS, THEN TAKE ONE ONCE DAILY 11/29/14   Rigoberto Noel, MD  Tiotropium Bromide-Olodaterol (STIOLTO RESPIMAT) 2.5-2.5 MCG/ACT AERS Inhale 2.5 mcg into the lungs daily. Inhale 2 puffs once daily 06/02/14   Rigoberto Noel, MD  verapamil (CALAN-SR) 180 MG CR tablet Take 180 mg by mouth at bedtime.     Historical Provider, MD   BP 157/104 mmHg  Pulse 97   Temp(Src) 98.1 F (36.7 C) (Oral)  Resp 23  Ht '5\' 5"'$  (1.651 m)  Wt 104 lb 15 oz (47.6 kg)  BMI 17.46 kg/m2  SpO2 97% Physical Exam  Constitutional: She appears listless. She appears cachectic. She has a sickly appearance. She appears ill.  Guppy breathing   HENT:  Head: Normocephalic and atraumatic.  Eyes: Conjunctivae and EOM are normal. Pupils are equal, round, and reactive to light.  Neck: Normal range of motion.  Cardiovascular: Normal rate, regular rhythm, normal heart sounds and intact distal pulses.  Exam reveals no gallop and no friction rub.   No murmur heard. Pulses:      Radial pulses  are 2+ on the right side, and 2+ on the left side.       Dorsalis pedis pulses are 0 on the right side, and 0 on the left side.       Posterior tibial pulses are 0 on the right side, and 0 on the left side.  Pulmonary/Chest: Effort normal. No respiratory distress. She has decreased breath sounds (dffuse).  Guppy breathing  Abdominal: Soft. She exhibits no distension. There is no tenderness. There is no guarding.  Musculoskeletal: She exhibits no edema or tenderness.  Neurological: She appears listless. GCS eye subscore is 2. GCS verbal subscore is 4. GCS motor subscore is 6.  Skin: Skin is dry. No rash noted. She is not diaphoretic. There is erythema (bilateral lower extremities (chronic per family, unchanged or improved)).  Cool extremities  Nursing note and vitals reviewed.   ED Course  Procedures (including critical care time) Labs Review Labs Reviewed  CBC WITH DIFFERENTIAL/PLATELET - Abnormal; Notable for the following:    WBC 12.4 (*)    MCV 101.1 (*)    Platelets 122 (*)    Neutro Abs 10.8 (*)    All other components within normal limits  URINALYSIS, ROUTINE W REFLEX MICROSCOPIC (NOT AT [facility]) - Abnormal; Notable for the following:    APPearance CLOUDY (*)    Hgb urine dipstick TRACE (*)    All other components within normal limits  BLOOD GAS, VENOUS - Abnormal; Notable for  the following:    pH, Ven 7.209 (*)    pCO2, Ven 132 (*)    pO2, Ven 23.1 (*)    Bicarbonate 50.8 (*)    Acid-Base Excess 16.1 (*)    All other components within normal limits  COMPREHENSIVE METABOLIC PANEL - Abnormal; Notable for the following:    Chloride 87 (*)    CO2 47 (*)    Glucose, Bld 138 (*)    BUN 24 (*)    Albumin 3.4 (*)    All other components within normal limits  URINE MICROSCOPIC-ADD ON - Abnormal; Notable for the following:    Bacteria, UA MANY (*)    Casts HYALINE CASTS (*)    All other components within normal limits  ACETAMINOPHEN LEVEL - Abnormal; Notable for the following:    Acetaminophen (Tylenol), Serum <10 (*)    All other components within normal limits  BRAIN NATRIURETIC PEPTIDE - Abnormal; Notable for the following:    B Natriuretic Peptide 206.9 (*)    All other components within normal limits  I-STAT CG4 LACTIC ACID, ED - Abnormal; Notable for the following:    Lactic Acid, Venous 2.32 (*)    All other components within normal limits  MRSA PCR SCREENING  URINE CULTURE  SALICYLATE LEVEL  CBC  I-STAT CG4 LACTIC ACID, ED  CBG MONITORING, ED  I-STAT TROPOININ, ED  I-STAT CG4 LACTIC ACID, ED  I-STAT TROPOININ, ED  I-STAT TROPOININ, ED    Imaging Review Ct Head Wo Contrast  12/16/2014   CLINICAL DATA:  Altered mental status  EXAM: CT HEAD WITHOUT CONTRAST  TECHNIQUE: Contiguous axial images were obtained from the base of the skull through the vertex without intravenous contrast.  COMPARISON:  None.  FINDINGS: No acute hemorrhage, infarct, or mass lesion is identified. No midline shift. Ventricles are normal in size. Orbits and paranasal sinuses are unremarkable. No skull fracture. Soft tissue density material within the external auditory canals is most compatible with cerumen. Mild motion artifact noted at the skull base.  IMPRESSION: No acute intracranial abnormality.   Electronically Signed   By: Conchita Paris M.D.   On: 12/16/2014 13:08   Dg  Chest Portable 1 View  12/16/2014   CLINICAL DATA:  Shortness of breath, altered mental status  EXAM: PORTABLE CHEST 1 VIEW  COMPARISON:  09/02/2014 chest CT, chest radiograph 07/04/2014  FINDINGS: The heart size and mediastinal contours are within normal limits. Both lungs are clear. Right basilar scarring reidentified. Trace left pleural fluid or thickening. The aorta is unfolded and ectatic. Diffuse mild prominence of the vascular markings may indicate congestion. The visualized skeletal structures are unremarkable.  IMPRESSION: Minimal vascular congestion and trace left pleural fluid or thickening. No focal abnormality.   Electronically Signed   By: Conchita Paris M.D.   On: 12/16/2014 12:47   I have personally reviewed and evaluated these images and lab results as part of my medical decision-making.   EKG Interpretation   Date/Time:  Thursday December 16 2014 11:42:12 EDT Ventricular Rate:  87 PR Interval:  182 QRS Duration: 159 QT Interval:  354 QTC Calculation: 426 R Axis:   -106 Text Interpretation:  Sinus or ectopic atrial rhythm Multiform ventricular  premature complexes Prominent P waves, nondiagnostic Right bundle branch  block Anterolateral infarct, age indeterminate Baseline wander No  significant change since last tracing Confirmed by [facility] MD, ERIN  ([id number]) on 12/16/2014 7:24:56 PM      MDM   Final diagnoses:  Acute on chronic respiratory failure with hypercapnia  Encephalopathy acute    69 year old female with a history of severe COPD on 2 L of oxygen at home, bilateral lower extremity erythema/cool feet for which she has had evaluation by vascular and podiatry with unknown etiology, evaluation for lung nodules presents with concern of altered mental status.  Patient is somnolent on my examination. Blood gas shows a pH is 7.2 with a PCO2 of 132.  CT head showed no acute abnormality. Likely cause of patient's encephalopathy is hypercarbia. Had long discussion with  family regarding goals of care, reviewed records of recent pulmonology visits. Discussed this with family who again states that patient does not desire intubation. They report they are willing to trial BiPAP, nebulizer treatments and steroids however we'll continue to have family conversations regarding patient's goals of care with possible shift towards comfort. Urine shows possible infection with many bacteria and rocephin ordered. Lactic acid, troponin initially normal. Pt to be admitted to Step Down unit for further care regarding acute on chronic respiratory failure with hypercapnia.   Gareth Morgan, MD 12/16/14 [phone]

## 2014-12-16 NOTE — ED Notes (Signed)
Bed: WA12 Expected date:  Expected time:  Means of arrival:  Comments: 

## 2014-12-16 NOTE — ED Notes (Signed)
RT at bedside.

## 2014-12-16 NOTE — Consult Note (Signed)
Name: Shawna [name] MRN: [id number] DOB: May 18, 1945    ADMISSION DATE:  12/16/2014 CONSULTATION DATE:  9/29  REFERRING MD :  Tyrell Antonio   CHIEF COMPLAINT:  Respiratory failure   BRIEF PATIENT DESCRIPTION: 69yo female with hx end-stage COPD with frequent, HTN, lung mass, AFib presented 9/29 with 1 week hx increased SOB and cough, acutely more somnolent 9/29 prompting family to call EMS.   In ER found to have PCO2>130, placed on bipap and PCCM consulted.   SIGNIFICANT EVENTS    STUDIES:    HISTORY OF PRESENT ILLNESS:  69yo female with hx end-stage COPD with frequent, HTN, lung mass, AFib presented 9/29 with 1 week hx increased SOB, acutely more somnolent 9/29 prompting family to call EMS.   In ER found to have PCO2>130, placed on bipap and PCCM consulted.    Per daughter, pt with overall decline last several weeks.  Increased difficulty with RLE circulation, increased "baseline" dyspnea despite relatively new rx morphine and increased cough.  Morphine does help per daughter but does not "last very long".   No reported c/o chest pain, hemoptysis, fever, n/v/d, increased BLE edema or orthopnea.   Of note, pt previously DNR/DNI.  Pt would NOT want intubation or mechanical ventilation.    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  has past surgical history that includes Carpal tunnel release; Tubal ligation; Cervical spine surgery; Lung surgery (Right, 07/06/11); Video bronchoscopy (Bilateral, 01/20/2013); Cataract extraction w/PHACO (Left, 11/30/2013); and cataract surgery (Left, 2010). Prior to Admission medications   Medication Sig Start Date End Date Taking?  Authorizing Provider  albuterol (PROVENTIL) (2.5 MG/3ML) 0.083% nebulizer solution Take 2.5 mg by nebulization 4 (four) times daily. For shortness of breath 02/08/12  Yes Shanker Kristeen Mans, MD  aspirin 81 MG chewable tablet Chew 1 tablet (81 mg total) by mouth daily. 07/06/14  Yes Tasrif Ahmed, MD  clopidogrel (PLAVIX) 75 MG tablet Take 75 mg by mouth daily.   Yes Historical Provider, MD  esomeprazole (NEXIUM) 20 MG capsule Take 20 mg by mouth daily at 12 noon.   Yes Historical Provider, MD  furosemide (LASIX) 20 MG tablet Take 1 tablet (20 mg total) by mouth daily. 07/19/14  Yes Rigoberto Noel, MD  guaiFENesin (MUCINEX) 600 MG 12 hr tablet Take 1 tablet (600 mg total) by mouth 2 (two) times daily as needed. Patient taking differently: Take 600 mg by mouth 2 (two) times daily.  03/18/14  Yes Rushil Sherrye Payor, MD  ipratropium (ATROVENT) 0.02 % nebulizer solution Take 0.5 mg by nebulization 4 (four) times daily.   Yes Historical Provider, MD  morphine 10 MG/5ML solution Take 2.5 mLs (5 mg total) by mouth every 4 (four) hours as needed (shortness of breath). 11/26/14  Yes Tammy S Parrett, NP  PROAIR RESPICLICK 503 (90 BASE) MCG/ACT AEPB INHALE TWO PUFFS EVERY 4 HOURS AS NEEDED 09/15/14  Yes Rigoberto Noel, MD  acetaminophen (TYLENOL) 500 MG tablet Take 500 mg by mouth every 6 (six) hours as needed for moderate pain.     Historical Provider, MD  ALPRAZolam Duanne Moron) 0.25 MG tablet Take 0.25 mg by mouth 2 (two) times daily. 07/21/13   Rigoberto Noel, MD  carvedilol (COREG) 3.125 MG tablet Take 3.125 mg by  mouth daily. 1/2 tab twice a day 06/29/14   Historical Provider, MD  Clopidogrel Bisulfate (PLAVIX PO) Take by mouth daily.    Historical Provider, MD  cyanocobalamin (,VITAMIN B-12,) 1000 MCG/ML injection Inject 1,000 mcg into the muscle every 30 (thirty) days. Given on the 10th of each month    Historical Provider, MD  Dentifrices (BIOTENE DRY MOUTH) GEL Place 1 application onto teeth 2 (two) times daily as needed  (dry mouth).    Historical Provider, MD  fluticasone (FLONASE) 50 MCG/ACT nasal spray Place 2 sprays into both nostrils as needed for allergies.  02/13/13   Elsie Stain, MD  KLOR-CON M20 20 MEQ tablet Take 20 mEq by mouth daily.  05/11/11   Historical Provider, MD  loratadine (CLARITIN) 10 MG tablet Take 1 tablet (10 mg total) by mouth daily. 02/08/12   Shanker Kristeen Mans, MD  polyethylene glycol (MIRALAX / GLYCOLAX) packet Take 17 g by mouth every other day.    Historical Provider, MD  predniSONE (DELTASONE) 10 MG tablet 1-2 daily as directed 08/05/14   Tammy S Parrett, NP  predniSONE (DELTASONE) 10 MG tablet Take '40mg'$  daily with food x 7 days, then '30mg'$  x 7 days, then '20mg'$  daily to continue. Patient not taking: Reported on 11/09/2014 09/07/14   Rigoberto Noel, MD  predniSONE (DELTASONE) 10 MG tablet TAKE TWO TABLETS BY MOUTH WITH FOOD FOR 7 DAYS, THEN TAKE ONE ONCE DAILY 11/29/14   Rigoberto Noel, MD  Tiotropium Bromide-Olodaterol (STIOLTO RESPIMAT) 2.5-2.5 MCG/ACT AERS Inhale 2.5 mcg into the lungs daily. Inhale 2 puffs once daily 06/02/14   Rigoberto Noel, MD  verapamil (CALAN-SR) 180 MG CR tablet Take 180 mg by mouth at bedtime.     Historical Provider, MD   Allergies  Allergen Reactions  . Aspirin Swelling    Lips and face swelled.  [name] [Oxycodone-Aspirin] Other (See Comments)    "went out of my head"  . Advair Diskus [Fluticasone-Salmeterol] Other (See Comments)    laryngitis    FAMILY HISTORY:  family history includes Alzheimer's disease in her mother; COPD in her father; Colon cancer in an other family member; Coronary artery disease (age of onset: 47) in her mother. SOCIAL HISTORY:  reports that she quit smoking about 2 years ago. Her smoking use included Cigarettes. She has a 40 pack-year smoking history. She has never used smokeless tobacco. She reports that she does not drink alcohol or use illicit drugs.  REVIEW OF SYSTEMS:   As per HPI - All other systems reviewed and  were neg.    SUBJECTIVE:   VITAL SIGNS: Temp:  [98.1 F (36.7 C)] 98.1 F (36.7 C) (09/29 1131) Pulse Rate:  [35-92] 92 (09/29 1617) Resp:  [17-26] 17 (09/29 1617) BP: (101-148)/(65-83) 101/65 mmHg (09/29 1610) SpO2:  [87 %-99 %] 99 % (09/29 1416) FiO2 (%):  [35 %] 35 % (09/29 1415)  PHYSICAL EXAMINATION: General:  Chronically ill appearing female, minimally responsive on bipap  Neuro:  Minimally responsive on bipap, initially more alert on my arrival, now only responds to sternal rub HEENT:  Mm dry, bipap Cardiovascular:  S1s2irreg, few PVC  Lungs:  resps even, mildly labored on bipap, few scattered wheeze  Abdomen:  Round, soft  Musculoskeletal:  RLE cool, purple, dressing c/d on large R foot ulcer    Recent Labs Lab 12/16/14 1236  NA 141  K 4.2  CL 87*  CO2 47*  BUN 24*  CREATININE 0.86  GLUCOSE 138*  Recent Labs Lab 12/16/14 1154  HGB 14.3  HCT 45.4  WBC 12.4*  PLT 122*   Ct Head Wo Contrast  12/16/2014   CLINICAL DATA:  Altered mental status  EXAM: CT HEAD WITHOUT CONTRAST  TECHNIQUE: Contiguous axial images were obtained from the base of the skull through the vertex without intravenous contrast.  COMPARISON:  None.  FINDINGS: No acute hemorrhage, infarct, or mass lesion is identified. No midline shift. Ventricles are normal in size. Orbits and paranasal sinuses are unremarkable. No skull fracture. Soft tissue density material within the external auditory canals is most compatible with cerumen. Mild motion artifact noted at the skull base.  IMPRESSION: No acute intracranial abnormality.   Electronically Signed   By: Conchita Paris M.D.   On: 12/16/2014 13:08   Dg Chest Portable 1 View  12/16/2014   CLINICAL DATA:  Shortness of breath, altered mental status  EXAM: PORTABLE CHEST 1 VIEW  COMPARISON:  09/02/2014 chest CT, chest radiograph 07/04/2014  FINDINGS: The heart size and mediastinal contours are within normal limits. Both lungs are clear. Right basilar  scarring reidentified. Trace left pleural fluid or thickening. The aorta is unfolded and ectatic. Diffuse mild prominence of the vascular markings may indicate congestion. The visualized skeletal structures are unremarkable.  IMPRESSION: Minimal vascular congestion and trace left pleural fluid or thickening. No focal abnormality.   Electronically Signed   By: Conchita Paris M.D.   On: 12/16/2014 12:47    ASSESSMENT / PLAN:  Acute on chronic hypercarbic respiratory failure - r/t ESCOPD with frequent exacerbations.  Seems to have had overall decline in last several weeks.  Not responding well to bipap, now with very poor mental status.    REC-  DNR/DNI  Continue solumedrol  Continue bipap for now - see discussion below  BD's  levaquin  PRN morphine   Discussed at length with daughter (HCPOA)/granddaughter at bedside.   Pt has been very clear in the past that she would not want intubation.  Her lung disease is end-stage and her overall health has been declining.  Plan will be to continue current care for now including bipap, IV steroids, BD's but if there is no significant improvement in next few hours they agree that the patient would want to d/c bipap and transition our focus to comfort measures only.  Their goal is to get her home with hospice and it may be reasonable to begin those efforts with the understanding that time may not be in our favor.       Nickolas Madrid, NP 12/16/2014  4:44 PM Pager: [phone] or [phone]

## 2014-12-16 NOTE — ED Notes (Signed)
hospitalist at bedside.   Pt earings given to family

## 2014-12-16 NOTE — ED Notes (Addendum)
Pt has DNR. Per ems pt is from home, hx of COPD, lung mass, pulmonary nodule,  caregiver called ems due to "breathing problems".  Pt has no circulation in bil feet. Cold to touch and red. Yesterday supposedly bil feet were blistered. Pt refused to go to hospital yesterday for altered mental status. Lady at home advised that yesterdays caregiver did not push the issue to call 911 about issue with feet. Caregiver reported that she had circulation, unsure when pt was last checked. Caregiver suspects UTI.    Today pt was given breathing treatment albuterol and morphine '5mg'$  PO. Pt pt wears 2.5 L [place] continuously at home, pt was 72% on 2 L [place] when ems arrived, ems upped oxygen to 6L [place] pt O2 between 88-92%.   Upon rn assessment, unable to find pedal pulses with doppler. md unable to find pedal pulses either. Pt randomly will open eyes, but not answering questions. Initially when she arrived pt could state name.

## 2014-12-16 NOTE — Progress Notes (Signed)
Spoke with Dr. Ashok Cordia at bedside, L/M for palliative care regarding patient.  Dr. Hilma Favors returning call, Dr. Ashok Cordia unavailable on unit.  Verbal order entered for palliative care consult.  Dr. Hilma Favors to see patient this evening.  Will continue to monitor.

## 2014-12-16 NOTE — ED Notes (Signed)
md at bedside

## 2014-12-16 NOTE — H&P (Signed)
[facility] History and Physical  Shawna [name] GBT:[id number] DOB: 1945/04/19 DOA: 12/16/2014  Referring physician: Dr Billy Fischer PCP: [name], DO   Chief Complaint: SOB  HPI: Shawna [name] is a 69 y.o. female Severe COPD, Pulmonary nodule follow by Dr Julien Nordmann, Dr Elsworth Soho, per Dr Elsworth Soho Note patient is not candidate for empiric radiation.  Patient presents with AMS, per family patient has been more sleepy for last week. Worse over last 24 hours. patient uses morphine every 4 hours for SOB. Also with increase work of breathing, SOB. Primary pulmonologist recommended hospice referral, but patient declined it at that time.   Evaluation in the ED; Patient was lethargic, ABG, Venous Ph; 7.2, CO 2 132, UA with many bacteria, CT head: no acute intracranial abnormalities, minimal vascular congestion, and trace pleural fluid or thickening.   Patient was started on BIPAP in the ED, she has become more alert.   Review of Systems:  Limited due to medical condition.   Past Medical History  Diagnosis Date  . COPD (chronic obstructive pulmonary disease)   . HTN (hypertension)   . B12 deficiency   . OA (osteoarthritis)   . Hypokalemia   . Mass of lung   . GERD (gastroesophageal reflux disease)   . Diverticulosis of colon (without mention of hemorrhage)   . 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 Pulmonary nodule   . Atrial fibrillation    Past Surgical History  Procedure Laterality Date  . Carpal tunnel release      bilateral  . Tubal ligation      x 2  . Cervical spine surgery      plate with screws  . Lung surgery Right 07/06/11    RUL, per patient Dr Roxan Hockey  . Video bronchoscopy Bilateral 01/20/2013    Procedure: VIDEO BRONCHOSCOPY WITH FLUORO;  Surgeon: Rigoberto Noel, MD;  Location: [facility];  Service: Cardiopulmonary;  Laterality: Bilateral;  . Cataract extraction w/phaco Left 11/30/2013    Procedure: CATARACT EXTRACTION PHACO AND INTRAOCULAR LENS  PLACEMENT (IOC);  Surgeon: Tonny Branch, MD;  Location: [facility];  Service: Ophthalmology;  Laterality: Left;  CDE:  11.35  . Cataract surgery Left 2010   Social History:  reports that she quit smoking about 2 years ago. Her smoking use included Cigarettes. She has a 40 pack-year smoking history. She has never used smokeless tobacco. She reports that she does not drink alcohol or use illicit drugs.  Allergies  Allergen Reactions  . Aspirin Swelling    Lips and face swelled.  [name] [Oxycodone-Aspirin] Other (See Comments)    "went out of my head"  . Advair Diskus [Fluticasone-Salmeterol] Other (See Comments)    laryngitis    Family History  Problem Relation Age of Onset  . Coronary artery disease Mother 44  . Alzheimer's disease Mother   . Colon cancer    . COPD Father     Prior to Admission medications   Medication Sig Start Date End Date Taking? Authorizing Provider  albuterol (PROVENTIL) (2.5 MG/3ML) 0.083% nebulizer solution Take 2.5 mg by nebulization 4 (four) times daily. For shortness of breath 02/08/12  Yes Shanker Kristeen Mans, MD  aspirin 81 MG chewable tablet Chew 1 tablet (81 mg total) by mouth daily. 07/06/14  Yes Tasrif Ahmed, MD  esomeprazole (NEXIUM) 20 MG capsule Take 20 mg by mouth daily at 12 noon.   Yes Historical Provider, MD  furosemide (LASIX) 20 MG tablet Take 1 tablet (20 mg total) by  mouth daily. 07/19/14  Yes Rigoberto Noel, MD  guaiFENesin (MUCINEX) 600 MG 12 hr tablet Take 1 tablet (600 mg total) by mouth 2 (two) times daily as needed. Patient taking differently: Take 600 mg by mouth 2 (two) times daily.  03/18/14  Yes Rushil Sherrye Payor, MD  ipratropium (ATROVENT) 0.02 % nebulizer solution Take 0.5 mg by nebulization 4 (four) times daily.   Yes Historical Provider, MD  morphine 10 MG/5ML solution Take 2.5 mLs (5 mg total) by mouth every 4 (four) hours as needed (shortness of breath). 11/26/14  Yes Tammy S Parrett, NP  PROAIR RESPICLICK 790 (90 BASE) MCG/ACT AEPB  INHALE TWO PUFFS EVERY 4 HOURS AS NEEDED 09/15/14  Yes Rigoberto Noel, MD  acetaminophen (TYLENOL) 500 MG tablet Take 500 mg by mouth every 6 (six) hours as needed for moderate pain.     Historical Provider, MD  ALPRAZolam Duanne Moron) 0.25 MG tablet Take 0.25 mg by mouth 2 (two) times daily. 07/21/13   Rigoberto Noel, MD  carvedilol (COREG) 3.125 MG tablet Take 3.125 mg by mouth daily. 1/2 tab twice a day 06/29/14   Historical Provider, MD  Clopidogrel Bisulfate (PLAVIX PO) Take by mouth daily.    Historical Provider, MD  cyanocobalamin (,VITAMIN B-12,) 1000 MCG/ML injection Inject 1,000 mcg into the muscle every 30 (thirty) days. Given on the 10th of each month    Historical Provider, MD  Dentifrices (BIOTENE DRY MOUTH) GEL Place 1 application onto teeth 2 (two) times daily as needed (dry mouth).    Historical Provider, MD  fluticasone (FLONASE) 50 MCG/ACT nasal spray Place 2 sprays into both nostrils as needed for allergies.  02/13/13   Elsie Stain, MD  KLOR-CON M20 20 MEQ tablet Take 20 mEq by mouth daily.  05/11/11   Historical Provider, MD  loratadine (CLARITIN) 10 MG tablet Take 1 tablet (10 mg total) by mouth daily. 02/08/12   Shanker Kristeen Mans, MD  polyethylene glycol (MIRALAX / GLYCOLAX) packet Take 17 g by mouth every other day.    Historical Provider, MD  predniSONE (DELTASONE) 10 MG tablet 1-2 daily as directed 08/05/14   Tammy S Parrett, NP  predniSONE (DELTASONE) 10 MG tablet Take '40mg'$  daily with food x 7 days, then '30mg'$  x 7 days, then '20mg'$  daily to continue. Patient not taking: Reported on 11/09/2014 09/07/14   Rigoberto Noel, MD  predniSONE (DELTASONE) 10 MG tablet TAKE TWO TABLETS BY MOUTH WITH FOOD FOR 7 DAYS, THEN TAKE ONE ONCE DAILY 11/29/14   Rigoberto Noel, MD  Tiotropium Bromide-Olodaterol (STIOLTO RESPIMAT) 2.5-2.5 MCG/ACT AERS Inhale 2.5 mcg into the lungs daily. Inhale 2 puffs once daily 06/02/14   Rigoberto Noel, MD  verapamil (CALAN-SR) 180 MG CR tablet Take 180 mg by mouth at bedtime.      Historical Provider, MD   Physical Exam: Filed Vitals:   12/16/14 1303 12/16/14 1320 12/16/14 1415 12/16/14 1416  BP:  124/73  123/74  Pulse: 92 85  85  Temp:      TempSrc:      Resp: '22 17  17  '$ SpO2: 92% 96% 99% 99%    Wt Readings from Last 3 Encounters:  11/09/14 48.081 kg (106 lb)  09/14/14 47.537 kg (104 lb 12.8 oz)  09/07/14 47.174 kg (104 lb)    General:  Mild distress, on BIPAP, She is more alert.  Eyes: PERRL, normal lids, irises & conjunctiva ENT: grossly normal hearing, lips & tongue Neck: no LAD, masses or  thyromegaly Cardiovascular: RRR, no m/r/g. Respiratory:  Decreased breath sounds, no wheezing. Tachypnea.  Abdomen: soft, ntnd Skin: bilateral LE with ulcers, rash, petechia rash.  Musculoskeletal: grossly normal tone BUE/BLE Psychiatric: grossly normal mood and affect, speech fluent and appropriate Neurologic: she is more alert, since she has been on BIPAP, answer some questions, moving extremities.           Labs on Admission:  Basic Metabolic Panel:  Recent Labs Lab 12/16/14 1236  NA 141  K 4.2  CL 87*  CO2 47*  GLUCOSE 138*  BUN 24*  CREATININE 0.86  CALCIUM 9.9   Liver Function Tests:  Recent Labs Lab 12/16/14 1236  AST 27  ALT 16  ALKPHOS 84  BILITOT 1.0  PROT 6.7  ALBUMIN 3.4*   No results for input(s): LIPASE, AMYLASE in the last 168 hours. No results for input(s): AMMONIA in the last 168 hours. CBC:  Recent Labs Lab 12/16/14 1154  WBC 12.4*  NEUTROABS 10.8*  HGB 14.3  HCT 45.4  MCV 101.1*  PLT 122*   Cardiac Enzymes: No results for input(s): CKTOTAL, CKMB, CKMBINDEX, TROPONINI in the last 168 hours.  BNP (last 3 results)  Recent Labs  03/13/14 1142 03/15/14 2222 07/04/14 1648  BNP 85.3 272.4* 178.1*    ProBNP (last 3 results) No results for input(s): PROBNP in the last 8760 hours.  CBG:  Recent Labs Lab 12/16/14 1151  GLUCAP 97    Radiological Exams on Admission: Ct Head Wo  Contrast  12/16/2014   CLINICAL DATA:  Altered mental status  EXAM: CT HEAD WITHOUT CONTRAST  TECHNIQUE: Contiguous axial images were obtained from the base of the skull through the vertex without intravenous contrast.  COMPARISON:  None.  FINDINGS: No acute hemorrhage, infarct, or mass lesion is identified. No midline shift. Ventricles are normal in size. Orbits and paranasal sinuses are unremarkable. No skull fracture. Soft tissue density material within the external auditory canals is most compatible with cerumen. Mild motion artifact noted at the skull base.  IMPRESSION: No acute intracranial abnormality.   Electronically Signed   By: Conchita [place] M.D.   On: 12/16/2014 13:08   Dg Chest Portable 1 View  12/16/2014   CLINICAL DATA:  Shortness of breath, altered mental status  EXAM: PORTABLE CHEST 1 VIEW  COMPARISON:  09/02/2014 chest CT, chest radiograph 07/04/2014  FINDINGS: The heart size and mediastinal contours are within normal limits. Both lungs are clear. Right basilar scarring reidentified. Trace left pleural fluid or thickening. The aorta is unfolded and ectatic. Diffuse mild prominence of the vascular markings may indicate congestion. The visualized skeletal structures are unremarkable.  IMPRESSION: Minimal vascular congestion and trace left pleural fluid or thickening. No focal abnormality.   Electronically Signed   By: Conchita [place] M.D.   On: 12/16/2014 12:47    EKG: Independently reviewed.   Assessment/Plan Active Problems:   COPD (chronic obstructive pulmonary disease)   Ovarian mass: PET SCAN POSITIVE    Chronic respiratory failure   COPD with acute exacerbation  1-Acute  on chronic hypoxic, hypercapnic Respiratory Failure;  Likely related to COPD. Will check BNP, will order lasix if BNP elevated.  Patient on initial presentation was very lethargic, increase work of breathing. She is more alert after been on BIPAP. Family would like to see if she respond to BIPAP. They are ok  with morphine PRN for increase work of breathing and or SOB.  Continue with Solumedrol IV TID. Nebulizer treatments. IV Levaquin.  I  have consulted patient pulmonologist for further discussion for prognosis.  Palliative care team also consulted.   2-Bacteria; On Levaquin. Follow urine culture.   3-Acute encephalopathy; in setting of hypercapnia. Improved with BIPAP/   4-Lower extremities ulcer, wound; wound care consulted.    Code Status: DNR/DNI DVT Prophylaxis: lovenox.  Family Communication: multiples family memeber at bedside. POA is Renea Disposition Plan: admit to step down unit, expect 2 to 3 days inpatient.  Time spent: 75 minutes.   Niel Hummer A [facility] Pager [phone]

## 2014-12-16 NOTE — Progress Notes (Signed)
Rt placed pt on BIPAP per MD order in ED.

## 2014-12-17 DIAGNOSIS — G9340 Encephalopathy, unspecified: Secondary | ICD-10-CM

## 2014-12-17 DIAGNOSIS — J9620 Acute and chronic respiratory failure, unspecified whether with hypoxia or hypercapnia: Secondary | ICD-10-CM

## 2014-12-17 MED ORDER — ACETAMINOPHEN 650 MG RE SUPP: 650.0000 mg | Freq: Four times a day (QID) | RECTAL | Status: AC | PRN

## 2014-12-17 MED ORDER — MORPHINE SULFATE (CONCENTRATE) 10 MG /0.5 ML PO SOLN: 10.0000 mg | ORAL | Status: AC | PRN

## 2014-12-17 MED ORDER — IPRATROPIUM-ALBUTEROL 0.5-2.5 (3) MG/3ML IN SOLN: 3.0000 mL | RESPIRATORY_TRACT | Status: AC

## 2014-12-17 MED ORDER — ALBUTEROL SULFATE (2.5 MG/3ML) 0.083% IN NEBU: 2.5000 mg | INHALATION_SOLUTION | RESPIRATORY_TRACT | Status: AC | PRN

## 2014-12-17 MED ORDER — LORAZEPAM 2 MG/ML PO CONC: 1.0000 mg | ORAL | Status: AC | PRN

## 2014-12-17 MED ORDER — HYDRALAZINE HCL 20 MG/ML IJ SOLN
2.0000 mg | Freq: Three times a day (TID) | INTRAMUSCULAR | Status: DC
  Administered 2014-12-17: 2 mg via INTRAVENOUS
  Filled 2014-12-17: qty 1

## 2014-12-17 MED ORDER — CETYLPYRIDINIUM CHLORIDE 0.05 % MT LIQD
7.0000 mL | Freq: Two times a day (BID) | OROMUCOSAL | Status: DC
  Administered 2014-12-17: 7 mL via OROMUCOSAL

## 2014-12-17 MED ORDER — PREDNISONE 5 MG/ML PO CONC: 20.0000 mg | Freq: Two times a day (BID) | ORAL | Status: AC

## 2014-12-18 LAB — URINE CULTURE

## 2014-12-18 NOTE — Discharge Summary (Signed)
Physician Discharge Summary  Shawna [name] WKG:[id number] DOB: Jul 27, 1945 DOA: 12/16/2014  PCP: [name], DO  Admit date: 12/16/2014 Discharge date: 01-14-2015  Time spent: 35 minutes  Recommendations for Outpatient Follow-up:  Discharge under hospice care.   Discharge Diagnoses:     Acute on chronic respiratory failure with hypoxia   Encephalopathy acute   COPD (chronic obstructive pulmonary disease)   Ovarian mass: PET SCAN POSITIVE    Chronic respiratory failure   COPD with acute exacerbation   Acute respiratory failure     Discharge Condition: Guarded. Poor prognosis.   Diet recommendation: Comfort feeding.   Filed Weights   12/16/14 1700  Weight: 47.6 kg (104 lb 15 oz)    History of present illness:  HPI: Shawna [name] is a 69 y.o. female Severe COPD, Pulmonary nodule follow by Dr Julien Nordmann, Dr Elsworth Soho, per Dr Elsworth Soho Note patient is not candidate for empiric radiation. Patient presents with AMS, per family patient has been more sleepy for last week. Worse over last 24 hours. patient uses morphine every 4 hours for SOB. Also with increase work of breathing, SOB. Primary pulmonologist recommended hospice referral, but patient declined it at that time.   Evaluation in the ED; Patient was lethargic, ABG, Venous Ph; 7.2, CO 2 132, UA with many bacteria, CT head: no acute intracranial abnormalities, minimal vascular congestion, and trace pleural fluid or thickening.   Patient was started on BIPAP in the ED, she has become more alert.   Hospital Course:  Acute on chronic hypoxic, hypercapnic Respiratory Failure;  Likely related to progression of COPD.  Patient on initial presentation was very lethargic, increase work of breathing. She was  more alert after been on BIPAP. Subsequently patient was not able to tolerates BIPAP. Family met with Pulmonologist who explain again poor prognosis, and progression of COPD. Family Met with Palliative care team, decision was to  transition to comfort care. Patient was discharge with home hospice. Prescription for morphine, ativan and liquid prednisone was provided at time of discharge  2-Bacteuria; received Levaquin. Now comfort care.    3-Acute encephalopathy; in setting of hypercapnia, hypoxia.    4-Lower extremities ulcer, wound; wound care consulted. Local care.   Procedures:  none  Consultations:  Palliative  Pulmonary   Discharge Exam: Filed Vitals:   2015/01/14 0100  BP: 106/73  Pulse:   Temp:   Resp: 18    General: Lethargic.  Cardiovascular: S 1, S 2 RRR Respiratory: bilateral ronchus, crackles, mild work of breathing  Discharge Instructions   Discharge Instructions    Diet general    Complete by:  As directed      Increase activity slowly    Complete by:  As directed           Current Discharge Medication List    START taking these medications   Details  acetaminophen (TYLENOL) 650 MG suppository Place 1 suppository (650 mg total) rectally every 6 (six) hours as needed for mild pain (or Fever >/= 101). Qty: 12 suppository, Refills: 0    !! albuterol (PROVENTIL) (2.5 MG/3ML) 0.083% nebulizer solution Take 3 mLs (2.5 mg total) by nebulization every 2 (two) hours as needed for wheezing. Qty: 75 mL, Refills: 12    ipratropium-albuterol (DUONEB) 0.5-2.5 (3) MG/3ML SOLN Take 3 mLs by nebulization every 4 (four) hours. Qty: 360 mL, Refills: 3    LORazepam (LORAZEPAM INTENSOL) 2 MG/ML concentrated solution Take 0.5 mLs (1 mg total) by mouth every 4 (four) hours as needed  for anxiety, sedation or sleep. Qty: 30 mL, Refills: 0    Morphine Sulfate (MORPHINE CONCENTRATE) 10 mg / 0.5 ml concentrated solution Take 0.5 mLs (10 mg total) by mouth every 2 (two) hours as needed for moderate pain, anxiety or shortness of breath. Qty: 30 mL, Refills: 0    predniSONE (PREDNISONE INTENSOL) 5 MG/ML concentrated solution Take 4 mLs (20 mg total) by mouth 2 (two) times daily with a meal. Qty: 30  mL, Refills: 0     !! - Potential duplicate medications found. Please discuss with provider.    CONTINUE these medications which have NOT CHANGED   Details  !! albuterol (PROVENTIL) (2.5 MG/3ML) 0.083% nebulizer solution Take 2.5 mg by nebulization 4 (four) times daily. For shortness of breath    furosemide (LASIX) 20 MG tablet Take 1 tablet (20 mg total) by mouth daily. Qty: 30 tablet, Refills: 0    polyethylene glycol (MIRALAX / GLYCOLAX) packet Take 17 g by mouth every other day.    PROAIR RESPICLICK 108 (90 BASE) MCG/ACT AEPB INHALE TWO PUFFS EVERY 4 HOURS AS NEEDED Qty: 1 each, Refills: 1    Tiotropium Bromide-Olodaterol (STIOLTO RESPIMAT) 2.5-2.5 MCG/ACT AERS Inhale 2.5 mcg into the lungs daily. Inhale 2 puffs once daily Qty: 1 Inhaler, Refills: 4    verapamil (CALAN-SR) 180 MG CR tablet Take 180 mg by mouth at bedtime.     Dentifrices (BIOTENE DRY MOUTH) GEL Place 1 application onto teeth 2 (two) times daily as needed (dry mouth).    fluticasone (FLONASE) 50 MCG/ACT nasal spray Place 2 sprays into both nostrils as needed for allergies.      !! - Potential duplicate medications found. Please discuss with provider.    STOP taking these medications     ALPRAZolam (XANAX) 0.25 MG tablet      amLODipine (NORVASC) 5 MG tablet      aspirin 81 MG chewable tablet      carvedilol (COREG) 3.125 MG tablet      esomeprazole (NEXIUM) 20 MG capsule      guaiFENesin (MUCINEX) 600 MG 12 hr tablet      ipratropium (ATROVENT) 0.02 % nebulizer solution      KLOR-CON M20 20 MEQ tablet      loratadine (CLARITIN) 10 MG tablet      morphine 10 MG/5ML solution      predniSONE (DELTASONE) 10 MG tablet      acetaminophen (TYLENOL) 500 MG tablet      cyanocobalamin (,VITAMIN B-12,) 1000 MCG/ML injection        Allergies  Allergen Reactions  . Aspirin Swelling    Lips and face swelled.  . Percodan [Oxycodone-Aspirin] Other (See Comments)    "went out of my head"  . Advair  Diskus [Fluticasone-Salmeterol] Other (See Comments)    laryngitis      The results of significant diagnostics from this hospitalization (including imaging, microbiology, ancillary and laboratory) are listed below for reference.    Significant Diagnostic Studies: Ct Head Wo Contrast  12/16/2014   CLINICAL DATA:  Altered mental status  EXAM: CT HEAD WITHOUT CONTRAST  TECHNIQUE: Contiguous axial images were obtained from the base of the skull through the vertex without intravenous contrast.  COMPARISON:  None.  FINDINGS: No acute hemorrhage, infarct, or mass lesion is identified. No midline shift. Ventricles are normal in size. Orbits and paranasal sinuses are unremarkable. No skull fracture. Soft tissue density material within the external auditory canals is most compatible with cerumen. Mild motion artifact noted   at the skull base.  IMPRESSION: No acute intracranial abnormality.   Electronically Signed   By: Conchita [place] M.D.   On: 12/16/2014 13:08   Dg Chest Portable 1 View  12/16/2014   CLINICAL DATA:  Shortness of breath, altered mental status  EXAM: PORTABLE CHEST 1 VIEW  COMPARISON:  09/02/2014 chest CT, chest radiograph 07/04/2014  FINDINGS: The heart size and mediastinal contours are within normal limits. Both lungs are clear. Right basilar scarring reidentified. Trace left pleural fluid or thickening. The aorta is unfolded and ectatic. Diffuse mild prominence of the vascular markings may indicate congestion. The visualized skeletal structures are unremarkable.  IMPRESSION: Minimal vascular congestion and trace left pleural fluid or thickening. No focal abnormality.   Electronically Signed   By: Conchita [place] M.D.   On: 12/16/2014 12:47    Microbiology: Recent Results (from the past 240 hour(s))  MRSA PCR Screening     Status: None   Collection Time: 12/16/14  5:20 PM  Result Value Ref Range Status   MRSA by PCR NEGATIVE NEGATIVE Final    Comment:        The GeneXpert MRSA Assay  (FDA approved for NASAL specimens only), is one component of a comprehensive MRSA colonization surveillance program. It is not intended to diagnose MRSA infection nor to guide or monitor treatment for MRSA infections.      Labs: Basic Metabolic Panel:  Recent Labs Lab 12/16/14 1236  NA 141  K 4.2  CL 87*  CO2 47*  GLUCOSE 138*  BUN 24*  CREATININE 0.86  CALCIUM 9.9   Liver Function Tests:  Recent Labs Lab 12/16/14 1236  AST 27  ALT 16  ALKPHOS 84  BILITOT 1.0  PROT 6.7  ALBUMIN 3.4*   No results for input(s): LIPASE, AMYLASE in the last 168 hours. No results for input(s): AMMONIA in the last 168 hours. CBC:  Recent Labs Lab 12/16/14 1154  WBC 12.4*  NEUTROABS 10.8*  HGB 14.3  HCT 45.4  MCV 101.1*  PLT 122*   Cardiac Enzymes: No results for input(s): CKTOTAL, CKMB, CKMBINDEX, TROPONINI in the last 168 hours. BNP: BNP (last 3 results)  Recent Labs  03/15/14 2222 07/04/14 1648 12/16/14 1154  BNP 272.4* 178.1* 206.9*    ProBNP (last 3 results) No results for input(s): PROBNP in the last 8760 hours.  CBG:  Recent Labs Lab 12/16/14 1151  GLUCAP 97       Signed:  Aryanne Gilleland A  [facility] 01-10-15, 8:54 AM

## 2014-12-18 NOTE — Progress Notes (Signed)
Chaplain paged to provide care to Shawna [name] and her family. Shawna Straus is a person of [employer] faith who is as active in her faith traditions as she is able. She is supported by her community of faith, [place], [place], and others in [place], [place]. These will provide her and family members care and comfort during her EOL transitions.  Shawna Schmit was not able to speak to the chaplain but communicated with nods of her head. Shawna Purdum is accepting that she is in her final stages of life. She is at peace with her medical situation and at peace with being placed in hospice and comfort care. Her desire is to return to her home in [place], [place] to died surrounded by the things she loves. Indications are if able to be transported to her home, she will be placed in hospice care as early as in the morning. This is a comfort to Shawna Doug Sou.   Shawna Pack has the loving support of three daughters, a brother and a sister. All present indicate she will be well attended at home by family and friends. Their communities of faith will provide care and comfort to them as well. Daughter [name] is a Marine scientist at [employer] and is designated as Solicitor.   Spiritual Goals of Care are hospice chaplain care in addition to that provided by communities of faith. Peaceful, safe and comforting environments both in the hospital and at home, so that her current sense of peace with her coming death may be sustained through her final hours. Spiritual support for her family by chaplains and faith leaders.  Family members express great appreciation for the staff of [facility], especially to Dr Hilma Favors for her loving care of Shawna Kochanowski.  Page chaplain immediately if Shawna Castelluccio or her family members desire or need spiritual care.  Sallee Lange. [place], DMin, MDiv Chaplain

## 2014-12-18 NOTE — Clinical Documentation Improvement (Signed)
Internal Medicine  Can the diagnosis of acute encephalopathy be further specified in progress notes and discharge summary?   Encephalopathy - Alcoholic, Anoxic/Hypoxia, Drug Induced/Toxic (specify drug), Hepatic, Hypertensive, Hypoglycemic, Metabolic/Septic, Traumatic/post concussive, Wernicke, Other  Other  Clinically Undetermined  Document any associated diagnoses/conditions.   Supporting Information:  Presents with SOB and AMS  Admitted with Acute on Chronic respiratory failure with hypercarbia & hypoxia associated with COPD exacerbation  Per H&P  3-Acute encephalopathy; in setting of hypercapnia. Improved with BIPAP/ Encephalopathy likely hypercarbic. Improved by BiPAP  Component     Latest Ref Rng 12/16/2014  pH, Ven     7.250 - 7.300 7.209 (L)  pCO2, Ven     45.0 - 50.0 mmHg 132 (HH)  pO2, Ven     30.0 - 45.0 mmHg 23.1 (LL)  Bicarbonate     20.0 - 24.0 mEq/L 50.8 (H)  TCO2     0 - 100 mmol/L 47.7  Acid-Base Excess     0.0 - 2.0 mmol/L 16.1 (H)  O2 Saturation      30.0  Patient temperature      98.6  Collection site      VENOUS  Drawn by      COLLECTED BY LABORATORY  Sample type      VENOUS    Please exercise your independent, professional judgment when responding. A specific answer is not anticipated or expected.   Thank You,  [place] [phone]

## 2014-12-18 NOTE — Progress Notes (Signed)
Medical necessity form / DNR has been left with nsg for [facility] transport.  Roselyn Reef haidinger LCSW [phone]

## 2014-12-18 NOTE — Consult Note (Signed)
WOC wound consult note Reason for Consult:Noted is Palliative Care consult and POC.  Asked to see for LEs Wound type:Vascular insufficiency Pressure Ulcer POA: Yes Stage 1 to sacrum Measurement: 3cm x 4cm with no depth.  oN bilateral LEs, there are numerous areas of infarct, one deflated blister on the right heel. Wound bed:As described.  Drainage (amount, consistency, odor) Serous, moderate amount from deflated blister. Periwound: Rubor, extremities cool to touch. Dressing procedure/placement/frequency: I will provide the daughters and patient with bilateral pressure redistribution heel boots to prevent pressure ulceration. Topical conservative care with petrolatum gauze is inexpensive, effective and easy to apply, so those orders are provided. [facility] nursing team will not follow, but will remain available to this patient, the nursing and medical teams.  Please re-consult if needed. Thanks, Maudie Flakes, MSN, RN, [place], [place], [place] [phone])

## 2014-12-18 NOTE — Progress Notes (Signed)
[id number]/[facility],RN,BSN,CCM [phone]/CHEKB with [facility] at [facility] will have hospital bed delivered.  Patient has o2 concentrator and o2 at home.  Will plan to transport by [facility] once bed is delivered.  Faxed requried information to [facility] per referral form.

## 2014-12-18 NOTE — Care Management Note (Signed)
Case Management Note  Patient Details  Name: Shawna [name] MRN: [id number] Date of Birth: 08-21-1945  Subjective/Objective:                  End stage copd  Action/Plan:  Home with [facility]   Expected Discharge Date:   (unknown)   [id number]            Expected Discharge Plan:  Home w Hospice Care  In-House Referral:  NA  Discharge planning Services  CM Consult  Post Acute Care Choice:  Hospice Choice offered to:  Adult Children  DME Arranged:  Hospital bed DME Agency:  Other - Comment  HH Arranged:  RN [place] Agency:  Other - See comment  Status of Service:  Completed, signed off  Medicare Important Message Given:    Date Medicare IM Given:    Medicare IM give by:    Date Additional Medicare IM Given:    Additional Medicare Important Message give by:     If discussed at [place] of Stay Meetings, dates discussed:    Additional Comments:  Leeroy Cha, RN 27-Dec-2014, 1:24 PM

## 2014-12-18 NOTE — Progress Notes (Signed)
IV removed. Pt is discharged home via [facility]. AVS papers given to pt's daughter.

## 2014-12-18 DEATH — deceased

## 2015-01-12 ENCOUNTER — Ambulatory Visit: Payer: Medicare Other | Admitting: Pulmonary Disease

## 2015-09-13 IMAGING — CR DG CHEST 2V
2 series · 2 of 2 positions shown · non-contrast
Comparison: 04/14/2013

CLINICAL DATA: Cough, shortness of Breath

EXAM:
CHEST  2 VIEW

[view not recorded (1 of 2)]
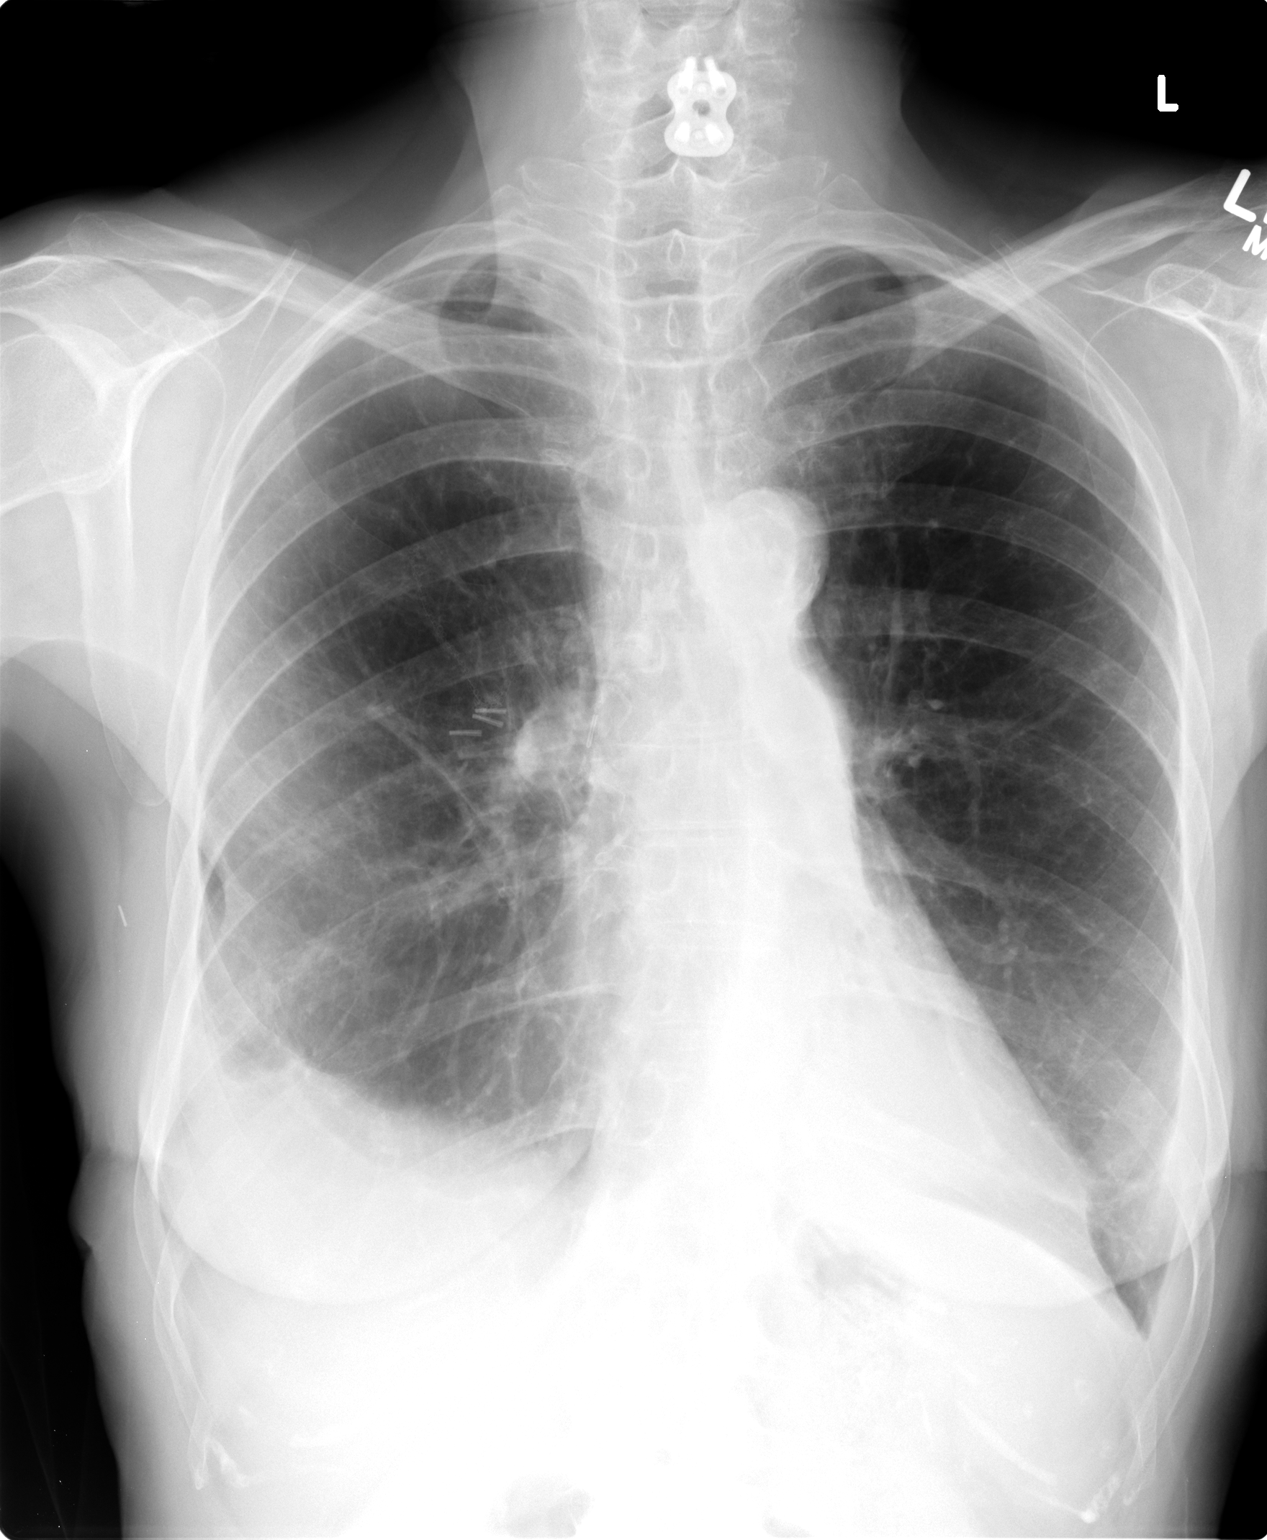

[view not recorded (2 of 2)]
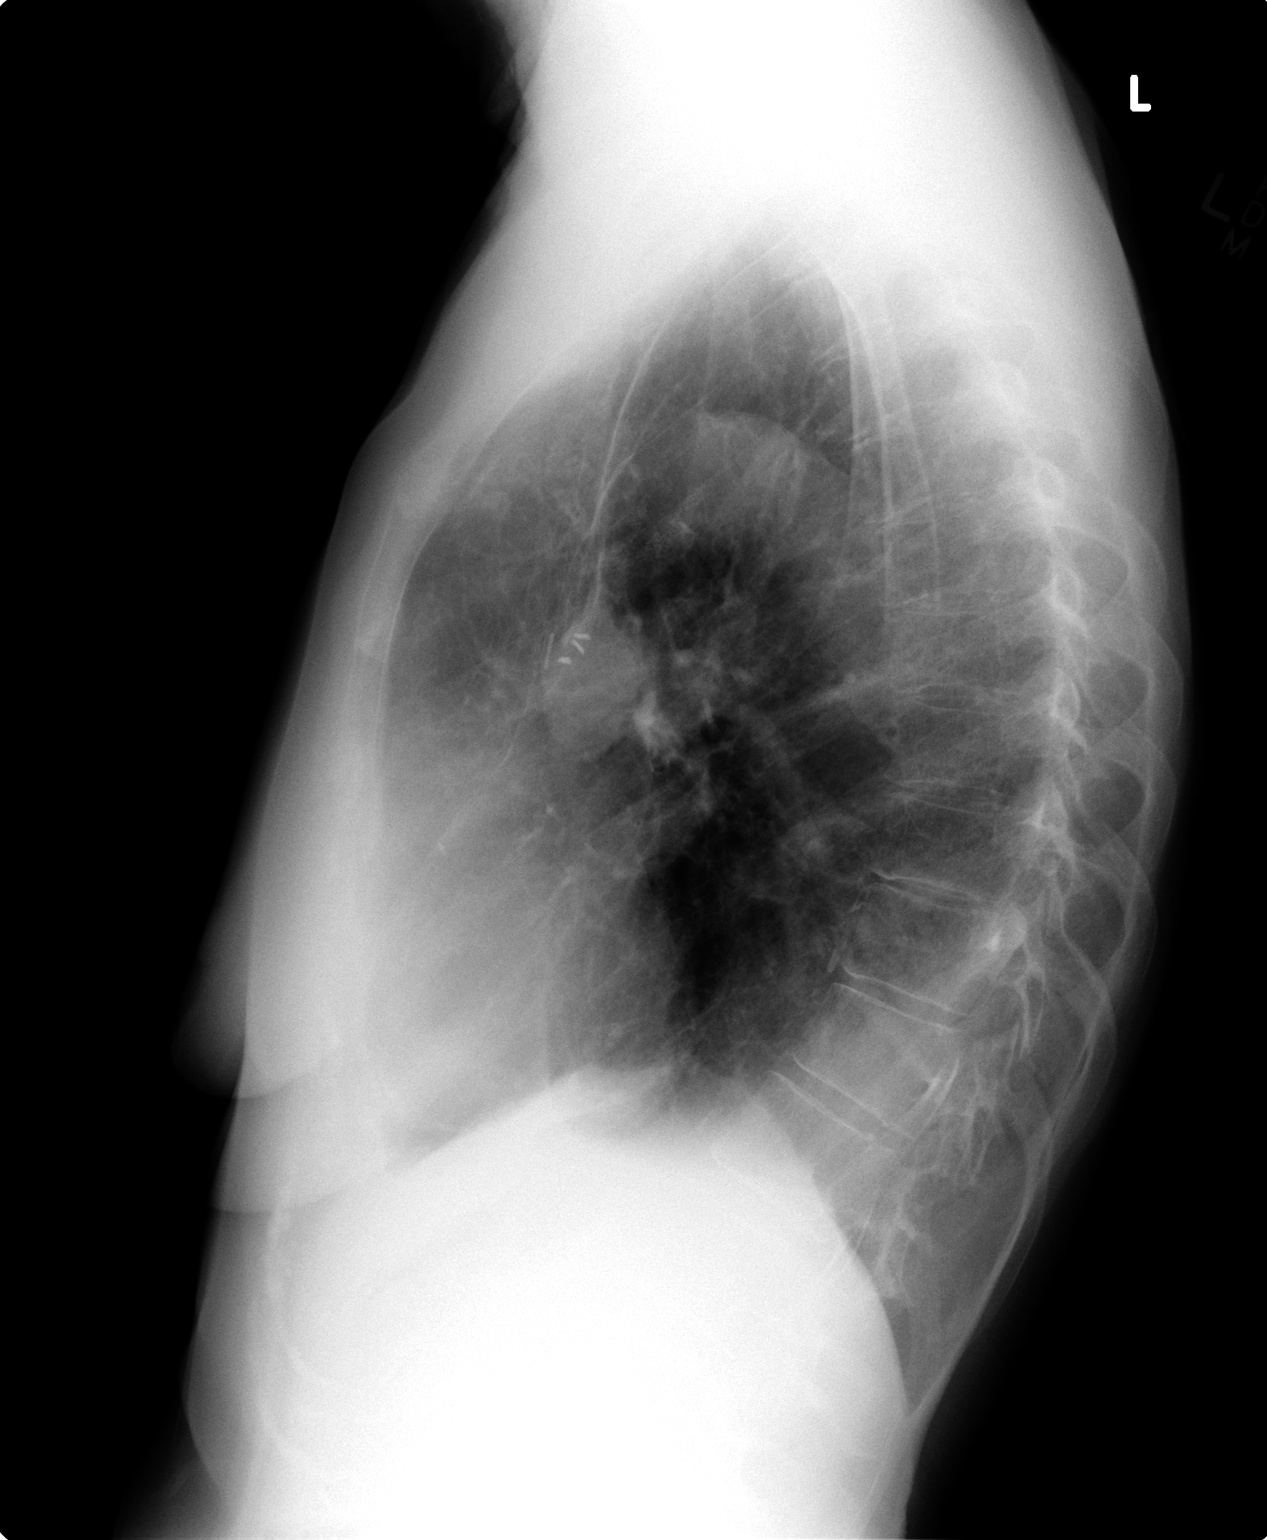

[2 of 2 positions shown; findings below may reference images not displayed]

FINDINGS: Cardiomediastinal silhouette is stable. Again noted status post
right upper lobectomy. Stable chronic blunting of the right
costophrenic angle. Surgical clips in right hilum again noted. Left
lung is clear. No acute infiltrate or pulmonary edema. Mild
degenerative changes thoracic spine.
IMPRESSION: No active disease.  Stable postsurgical changes right lung.
